# Patient Record
Sex: Female | Born: 1990 | Race: Black or African American | Hispanic: No | Marital: Single | Smoking: Never smoker
Health system: Southern US, Community
[De-identification: ages and names within clinical notes are randomized; demographics above are authoritative.]

## PROBLEM LIST (undated history)

## (undated) ENCOUNTER — Inpatient Hospital Stay (HOSPITAL_COMMUNITY): Payer: Self-pay

## (undated) DIAGNOSIS — D649 Anemia, unspecified: Secondary | ICD-10-CM

## (undated) DIAGNOSIS — O139 Gestational [pregnancy-induced] hypertension without significant proteinuria, unspecified trimester: Secondary | ICD-10-CM

## (undated) DIAGNOSIS — Z91018 Allergy to other foods: Secondary | ICD-10-CM

## (undated) DIAGNOSIS — M549 Dorsalgia, unspecified: Secondary | ICD-10-CM

## (undated) DIAGNOSIS — N83209 Unspecified ovarian cyst, unspecified side: Secondary | ICD-10-CM

## (undated) DIAGNOSIS — I10 Essential (primary) hypertension: Secondary | ICD-10-CM

## (undated) DIAGNOSIS — J189 Pneumonia, unspecified organism: Secondary | ICD-10-CM

## (undated) DIAGNOSIS — E669 Obesity, unspecified: Secondary | ICD-10-CM

## (undated) DIAGNOSIS — Z3490 Encounter for supervision of normal pregnancy, unspecified, unspecified trimester: Secondary | ICD-10-CM

## (undated) HISTORY — DX: Dorsalgia, unspecified: M54.9

## (undated) HISTORY — PX: TONSILLECTOMY: SUR1361

## (undated) HISTORY — PX: WISDOM TOOTH EXTRACTION: SHX21

## (undated) HISTORY — PX: KNEE ARTHROSCOPY: SUR90

## (undated) HISTORY — PX: DILATION AND CURETTAGE OF UTERUS: SHX78

## (undated) HISTORY — PX: TUBAL LIGATION: SHX77

## (undated) HISTORY — DX: Allergy to other foods: Z91.018

---
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
## 2008-01-08 ENCOUNTER — Emergency Department (HOSPITAL_COMMUNITY): Admission: EM | Admit: 2008-01-08 | Discharge: 2008-01-08 | Payer: Self-pay | Admitting: Family Medicine

## 2008-01-10 ENCOUNTER — Inpatient Hospital Stay (HOSPITAL_COMMUNITY): Admission: EM | Admit: 2008-01-10 | Discharge: 2008-01-12 | Payer: Self-pay | Admitting: Emergency Medicine

## 2008-01-18 ENCOUNTER — Inpatient Hospital Stay (HOSPITAL_COMMUNITY): Admission: AD | Admit: 2008-01-18 | Discharge: 2008-01-18 | Payer: Self-pay | Admitting: Obstetrics & Gynecology

## 2008-02-24 ENCOUNTER — Emergency Department (HOSPITAL_COMMUNITY): Admission: EM | Admit: 2008-02-24 | Discharge: 2008-02-24 | Payer: Self-pay | Admitting: Emergency Medicine

## 2008-04-08 ENCOUNTER — Emergency Department (HOSPITAL_COMMUNITY): Admission: EM | Admit: 2008-04-08 | Discharge: 2008-04-08 | Payer: Self-pay | Admitting: Emergency Medicine

## 2008-04-14 ENCOUNTER — Emergency Department (HOSPITAL_COMMUNITY): Admission: EM | Admit: 2008-04-14 | Discharge: 2008-04-14 | Payer: Self-pay | Admitting: Emergency Medicine

## 2008-07-01 ENCOUNTER — Emergency Department (HOSPITAL_COMMUNITY): Admission: EM | Admit: 2008-07-01 | Discharge: 2008-07-01 | Payer: Self-pay | Admitting: Family Medicine

## 2008-10-23 ENCOUNTER — Emergency Department (HOSPITAL_COMMUNITY): Admission: EM | Admit: 2008-10-23 | Discharge: 2008-10-23 | Payer: Self-pay | Admitting: Emergency Medicine

## 2008-11-02 ENCOUNTER — Encounter: Admission: RE | Admit: 2008-11-02 | Discharge: 2008-11-02 | Payer: Self-pay | Admitting: Family Medicine

## 2008-11-16 ENCOUNTER — Encounter: Admission: RE | Admit: 2008-11-16 | Discharge: 2008-12-22 | Payer: Self-pay | Admitting: Specialist

## 2008-12-13 ENCOUNTER — Ambulatory Visit: Payer: Self-pay | Admitting: Oncology

## 2009-01-04 LAB — CBC WITH DIFFERENTIAL (CANCER CENTER ONLY)
BASO#: 0.1 10*3/uL (ref 0.0–0.2)
BASO%: 0.6 % (ref 0.0–2.0)
EOS%: 4.7 % (ref 0.0–7.0)
HCT: 32.9 % — ABNORMAL LOW (ref 34.8–46.6)
LYMPH%: 25.6 % (ref 14.0–48.0)
MCHC: 33 g/dL (ref 32.0–36.0)
MCV: 67 fL — ABNORMAL LOW (ref 81–101)
MONO#: 0.4 10*3/uL (ref 0.1–0.9)
NEUT%: 64.4 % (ref 39.6–80.0)
RDW: 14.1 % (ref 10.5–14.6)

## 2009-01-04 LAB — CMP (CANCER CENTER ONLY)
Alkaline Phosphatase: 82 U/L (ref 26–84)
Glucose, Bld: 93 mg/dL (ref 73–118)
Sodium: 141 mEq/L (ref 128–145)
Total Bilirubin: 0.7 mg/dl (ref 0.20–1.60)
Total Protein: 7.6 g/dL (ref 6.4–8.1)

## 2009-01-04 LAB — MORPHOLOGY - CHCC SATELLITE: PLT EST ~~LOC~~: ADEQUATE

## 2009-01-06 LAB — PROTEIN ELECTROPHORESIS, SERUM
Beta 2: 7.3 % — ABNORMAL HIGH (ref 3.2–6.5)
Beta Globulin: 6.6 % (ref 4.7–7.2)
Total Protein, Serum Electrophoresis: 7.5 g/dL (ref 6.0–8.3)

## 2009-01-06 LAB — RETICULOCYTES (CHCC): Retic Ct Pct: 1.7 % (ref 0.4–3.1)

## 2009-01-06 LAB — HEMOGLOBINOPATHY EVALUATION: Hgb A2 Quant: 2.5 % (ref 2.2–3.2)

## 2009-01-06 LAB — IRON AND TIBC
%SAT: 19 % — ABNORMAL LOW (ref 20–55)
Iron: 67 ug/dL (ref 42–145)

## 2009-01-09 ENCOUNTER — Emergency Department (HOSPITAL_COMMUNITY): Admission: EM | Admit: 2009-01-09 | Discharge: 2009-01-09 | Payer: Self-pay | Admitting: Family Medicine

## 2009-01-12 ENCOUNTER — Emergency Department (HOSPITAL_COMMUNITY): Admission: EM | Admit: 2009-01-12 | Discharge: 2009-01-12 | Payer: Self-pay | Admitting: Family Medicine

## 2009-01-18 ENCOUNTER — Ambulatory Visit: Payer: Self-pay | Admitting: Oncology

## 2009-01-19 ENCOUNTER — Encounter: Admission: RE | Admit: 2009-01-19 | Discharge: 2009-03-15 | Payer: Self-pay | Admitting: Specialist

## 2009-02-06 LAB — CBC WITH DIFFERENTIAL (CANCER CENTER ONLY)
Eosinophils Absolute: 0.2 10*3/uL (ref 0.0–0.5)
HCT: 33.2 % — ABNORMAL LOW (ref 34.8–46.6)
HGB: 10.8 g/dL — ABNORMAL LOW (ref 11.6–15.9)
LYMPH%: 34.1 % (ref 14.0–48.0)
MCV: 68 fL — ABNORMAL LOW (ref 81–101)
MONO#: 0.5 10*3/uL (ref 0.1–0.9)
NEUT%: 56.4 % (ref 39.6–80.0)
RBC: 4.87 10*6/uL (ref 3.70–5.32)
WBC: 8.2 10*3/uL (ref 3.9–10.0)

## 2009-02-25 ENCOUNTER — Emergency Department (HOSPITAL_COMMUNITY): Admission: EM | Admit: 2009-02-25 | Discharge: 2009-02-26 | Payer: Self-pay | Admitting: Emergency Medicine

## 2009-03-07 ENCOUNTER — Emergency Department (HOSPITAL_COMMUNITY): Admission: EM | Admit: 2009-03-07 | Discharge: 2009-03-07 | Payer: Self-pay | Admitting: Emergency Medicine

## 2009-03-08 ENCOUNTER — Inpatient Hospital Stay (HOSPITAL_COMMUNITY): Admission: AD | Admit: 2009-03-08 | Discharge: 2009-03-08 | Payer: Self-pay | Admitting: Obstetrics & Gynecology

## 2009-03-11 ENCOUNTER — Ambulatory Visit (HOSPITAL_COMMUNITY): Admission: AD | Admit: 2009-03-11 | Discharge: 2009-03-11 | Payer: Self-pay | Admitting: Obstetrics & Gynecology

## 2009-03-14 ENCOUNTER — Emergency Department (HOSPITAL_COMMUNITY): Admission: EM | Admit: 2009-03-14 | Discharge: 2009-03-15 | Payer: Self-pay | Admitting: Emergency Medicine

## 2009-03-14 ENCOUNTER — Ambulatory Visit (HOSPITAL_COMMUNITY): Admission: RE | Admit: 2009-03-14 | Discharge: 2009-03-14 | Payer: Self-pay | Admitting: Obstetrics and Gynecology

## 2009-03-15 ENCOUNTER — Inpatient Hospital Stay (HOSPITAL_COMMUNITY): Admission: AD | Admit: 2009-03-15 | Discharge: 2009-03-15 | Payer: Self-pay | Admitting: Family Medicine

## 2009-03-18 ENCOUNTER — Inpatient Hospital Stay (HOSPITAL_COMMUNITY): Admission: AD | Admit: 2009-03-18 | Discharge: 2009-03-18 | Payer: Self-pay | Admitting: Obstetrics & Gynecology

## 2009-03-21 ENCOUNTER — Ambulatory Visit (HOSPITAL_COMMUNITY): Admission: RE | Admit: 2009-03-21 | Discharge: 2009-03-21 | Payer: Self-pay | Admitting: Family Medicine

## 2009-03-26 ENCOUNTER — Ambulatory Visit: Payer: Self-pay | Admitting: Obstetrics and Gynecology

## 2009-03-26 ENCOUNTER — Inpatient Hospital Stay (HOSPITAL_COMMUNITY): Admission: AD | Admit: 2009-03-26 | Discharge: 2009-03-26 | Payer: Self-pay | Admitting: Obstetrics and Gynecology

## 2009-03-28 ENCOUNTER — Inpatient Hospital Stay (HOSPITAL_COMMUNITY): Admission: AD | Admit: 2009-03-28 | Discharge: 2009-03-28 | Payer: Self-pay | Admitting: Obstetrics & Gynecology

## 2009-04-04 ENCOUNTER — Inpatient Hospital Stay (HOSPITAL_COMMUNITY): Admission: AD | Admit: 2009-04-04 | Discharge: 2009-04-04 | Payer: Self-pay | Admitting: Obstetrics and Gynecology

## 2009-05-03 ENCOUNTER — Ambulatory Visit: Payer: Self-pay | Admitting: Obstetrics and Gynecology

## 2009-05-03 ENCOUNTER — Ambulatory Visit: Payer: Self-pay | Admitting: Oncology

## 2009-06-12 ENCOUNTER — Emergency Department (HOSPITAL_COMMUNITY): Admission: EM | Admit: 2009-06-12 | Discharge: 2009-06-12 | Payer: Self-pay | Admitting: Emergency Medicine

## 2009-06-17 ENCOUNTER — Emergency Department (HOSPITAL_COMMUNITY): Admission: EM | Admit: 2009-06-17 | Discharge: 2009-06-17 | Payer: Self-pay | Admitting: Emergency Medicine

## 2009-07-10 ENCOUNTER — Emergency Department (HOSPITAL_COMMUNITY): Admission: EM | Admit: 2009-07-10 | Discharge: 2009-07-10 | Payer: Self-pay | Admitting: Emergency Medicine

## 2009-07-14 ENCOUNTER — Emergency Department (HOSPITAL_COMMUNITY): Admission: EM | Admit: 2009-07-14 | Discharge: 2009-07-14 | Payer: Self-pay | Admitting: Emergency Medicine

## 2009-07-20 ENCOUNTER — Emergency Department (HOSPITAL_COMMUNITY): Admission: EM | Admit: 2009-07-20 | Discharge: 2009-07-20 | Payer: Self-pay | Admitting: Emergency Medicine

## 2009-07-29 ENCOUNTER — Emergency Department (HOSPITAL_COMMUNITY): Admission: EM | Admit: 2009-07-29 | Discharge: 2009-07-29 | Payer: Self-pay | Admitting: Emergency Medicine

## 2009-08-16 ENCOUNTER — Emergency Department (HOSPITAL_COMMUNITY): Admission: EM | Admit: 2009-08-16 | Discharge: 2009-08-16 | Payer: Self-pay | Admitting: Emergency Medicine

## 2009-09-11 ENCOUNTER — Emergency Department (HOSPITAL_COMMUNITY): Admission: EM | Admit: 2009-09-11 | Discharge: 2009-09-11 | Payer: Self-pay | Admitting: Emergency Medicine

## 2009-09-25 ENCOUNTER — Emergency Department (HOSPITAL_COMMUNITY): Admission: EM | Admit: 2009-09-25 | Discharge: 2009-09-25 | Payer: Self-pay | Admitting: Emergency Medicine

## 2009-09-26 ENCOUNTER — Emergency Department (HOSPITAL_COMMUNITY): Admission: EM | Admit: 2009-09-26 | Discharge: 2009-09-26 | Payer: Self-pay | Admitting: Emergency Medicine

## 2009-10-23 ENCOUNTER — Emergency Department (HOSPITAL_COMMUNITY): Admission: EM | Admit: 2009-10-23 | Discharge: 2009-10-23 | Payer: Self-pay | Admitting: Emergency Medicine

## 2009-11-30 ENCOUNTER — Emergency Department (HOSPITAL_COMMUNITY): Admission: EM | Admit: 2009-11-30 | Discharge: 2009-12-01 | Payer: Self-pay | Admitting: Emergency Medicine

## 2010-02-17 ENCOUNTER — Inpatient Hospital Stay (HOSPITAL_COMMUNITY): Admission: AD | Admit: 2010-02-17 | Discharge: 2010-02-17 | Payer: Self-pay | Admitting: Obstetrics and Gynecology

## 2010-02-19 ENCOUNTER — Ambulatory Visit: Payer: Self-pay | Admitting: Family

## 2010-02-19 ENCOUNTER — Inpatient Hospital Stay (HOSPITAL_COMMUNITY): Admission: AD | Admit: 2010-02-19 | Discharge: 2010-02-20 | Payer: Self-pay | Admitting: Obstetrics and Gynecology

## 2010-02-22 ENCOUNTER — Inpatient Hospital Stay (HOSPITAL_COMMUNITY): Admission: AD | Admit: 2010-02-22 | Discharge: 2010-02-22 | Payer: Self-pay | Admitting: Obstetrics & Gynecology

## 2010-02-22 ENCOUNTER — Ambulatory Visit: Payer: Self-pay | Admitting: Nurse Practitioner

## 2010-03-07 ENCOUNTER — Inpatient Hospital Stay (HOSPITAL_COMMUNITY): Admission: AD | Admit: 2010-03-07 | Discharge: 2010-03-07 | Payer: Self-pay | Admitting: Obstetrics and Gynecology

## 2010-03-07 ENCOUNTER — Ambulatory Visit: Payer: Self-pay | Admitting: Obstetrics and Gynecology

## 2010-03-14 ENCOUNTER — Inpatient Hospital Stay (HOSPITAL_COMMUNITY): Admission: AD | Admit: 2010-03-14 | Discharge: 2010-03-14 | Payer: Self-pay | Admitting: Obstetrics and Gynecology

## 2010-03-26 ENCOUNTER — Emergency Department (HOSPITAL_BASED_OUTPATIENT_CLINIC_OR_DEPARTMENT_OTHER): Admission: EM | Admit: 2010-03-26 | Discharge: 2010-03-26 | Payer: Self-pay | Admitting: Emergency Medicine

## 2010-04-08 ENCOUNTER — Ambulatory Visit: Payer: Self-pay | Admitting: Obstetrics and Gynecology

## 2010-04-08 ENCOUNTER — Inpatient Hospital Stay (HOSPITAL_COMMUNITY): Admission: AD | Admit: 2010-04-08 | Discharge: 2010-04-08 | Payer: Self-pay | Admitting: Obstetrics and Gynecology

## 2010-04-11 ENCOUNTER — Ambulatory Visit: Payer: Self-pay | Admitting: Family Medicine

## 2010-04-11 ENCOUNTER — Ambulatory Visit (HOSPITAL_COMMUNITY): Admission: RE | Admit: 2010-04-11 | Discharge: 2010-04-11 | Payer: Self-pay | Admitting: Family Medicine

## 2010-04-27 ENCOUNTER — Ambulatory Visit: Payer: Self-pay | Admitting: Obstetrics & Gynecology

## 2010-05-13 ENCOUNTER — Ambulatory Visit: Payer: Self-pay | Admitting: Diagnostic Radiology

## 2010-05-13 ENCOUNTER — Emergency Department (HOSPITAL_BASED_OUTPATIENT_CLINIC_OR_DEPARTMENT_OTHER): Admission: EM | Admit: 2010-05-13 | Discharge: 2010-05-13 | Payer: Self-pay | Admitting: Emergency Medicine

## 2010-05-24 ENCOUNTER — Emergency Department (HOSPITAL_COMMUNITY): Admission: EM | Admit: 2010-05-24 | Discharge: 2009-12-08 | Payer: Self-pay | Admitting: Emergency Medicine

## 2010-06-11 IMAGING — CR DG KNEE COMPLETE 4+V*L*
4 series · 4 of 4 positions shown · non-contrast
Comparison: None

CLINICAL DATA: Fell - knee pain

LEFT KNEE - COMPLETE 4+ VIEW

[t knee ap left]
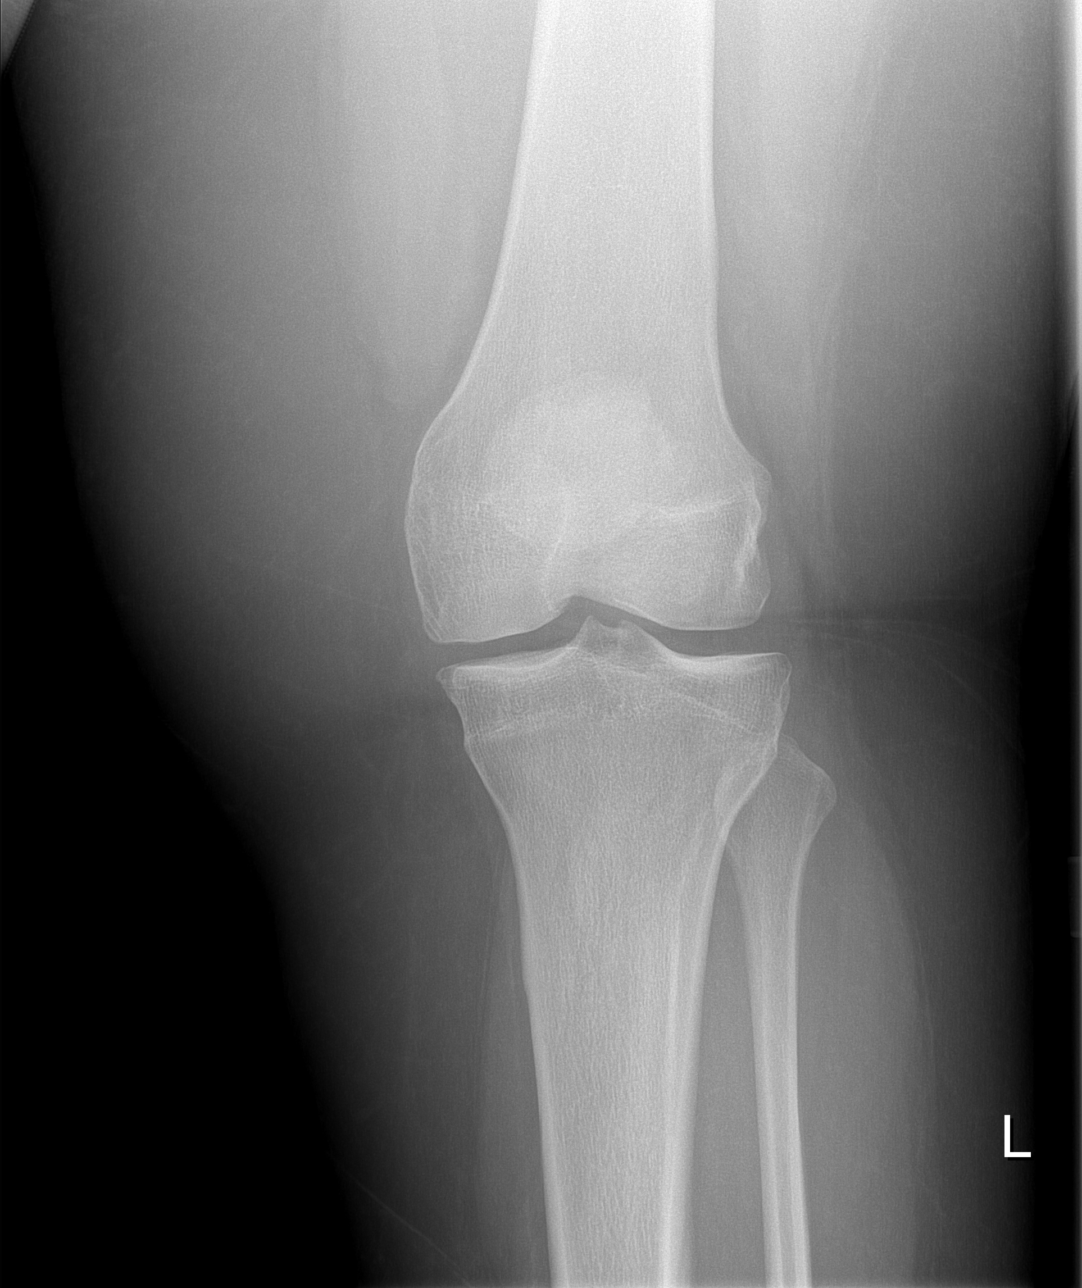

[t knee oblique left (1 of 2)]
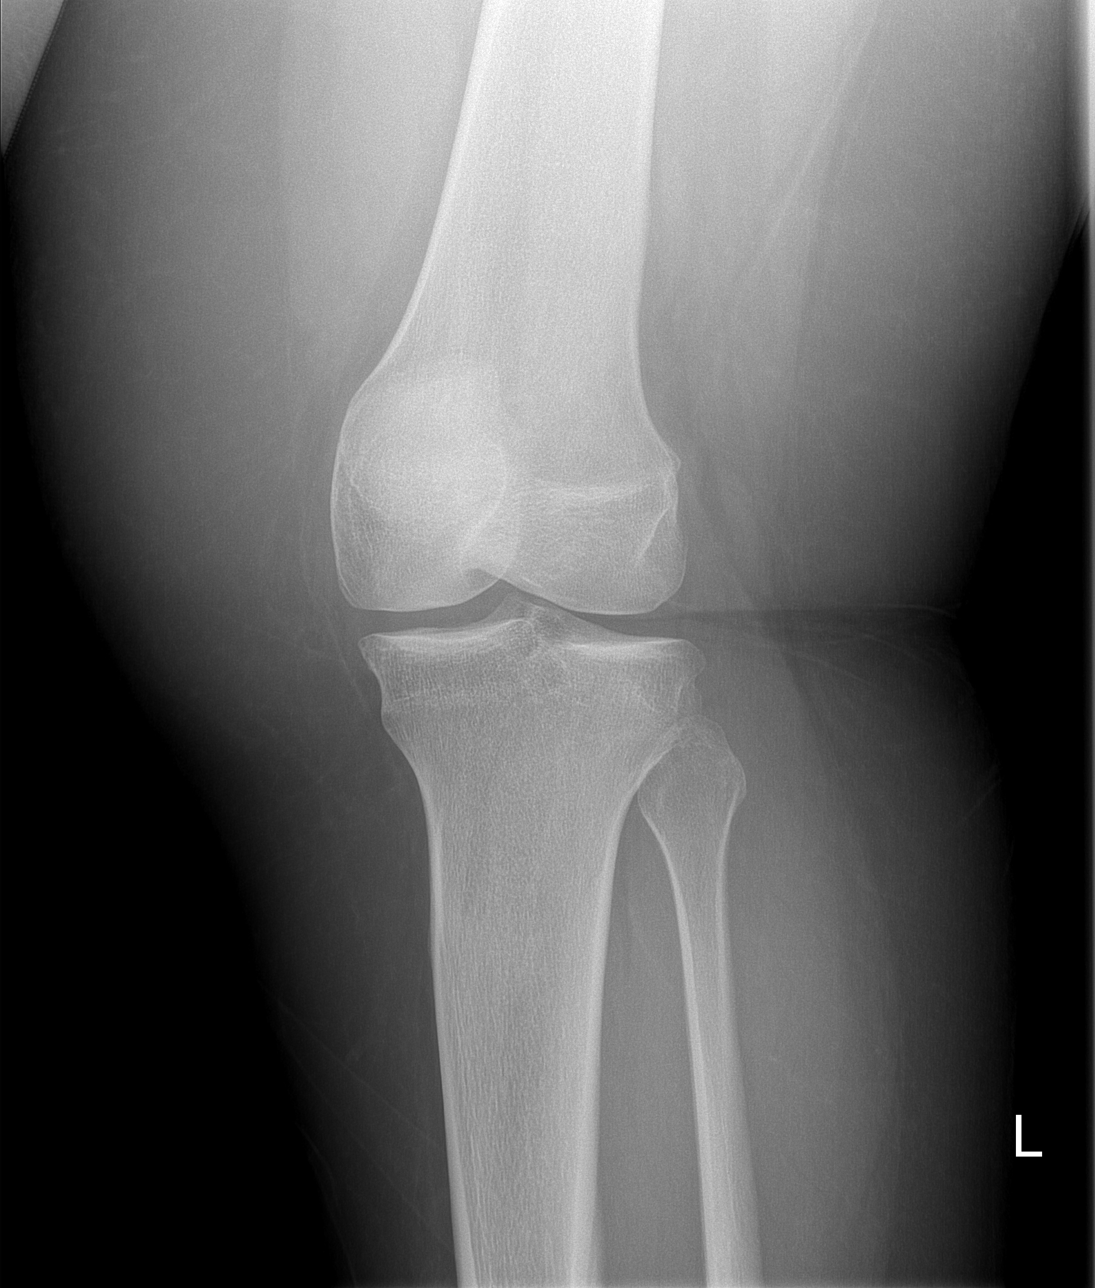

[t knee oblique left (2 of 2)]
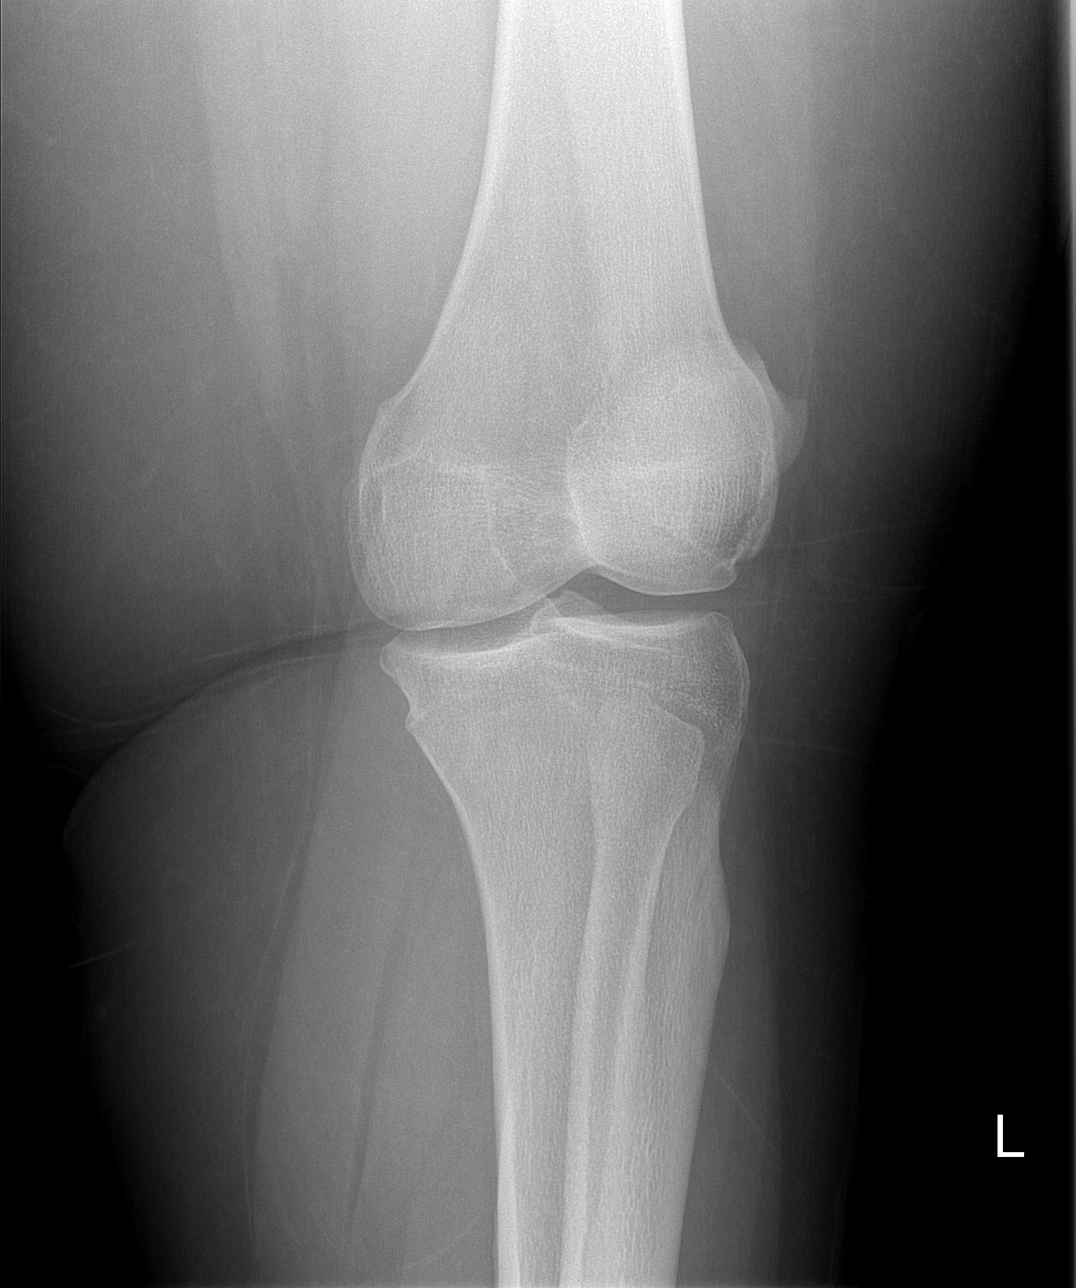

[t knee lat left]
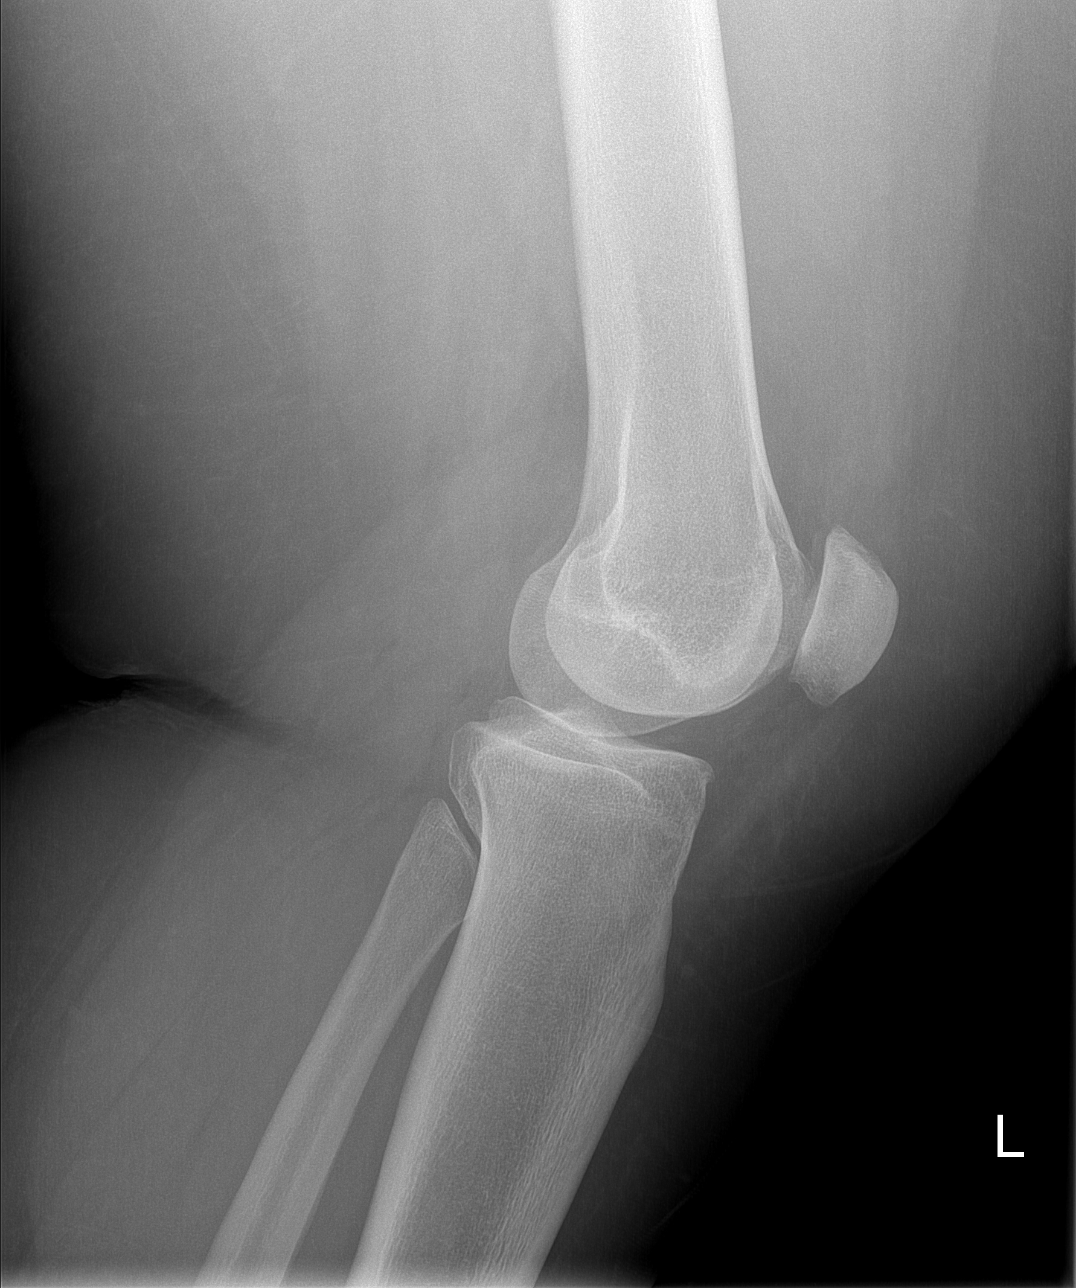

[4 of 4 positions shown; findings below may reference images not displayed]

FINDINGS: No fracture, dislocation, or joint effusion.  There are
mild degenerative changes.
IMPRESSION: No acute findings.

## 2010-07-04 ENCOUNTER — Emergency Department (HOSPITAL_BASED_OUTPATIENT_CLINIC_OR_DEPARTMENT_OTHER)
Admission: EM | Admit: 2010-07-04 | Discharge: 2010-07-04 | Payer: Self-pay | Source: Home / Self Care | Admitting: Emergency Medicine

## 2010-07-11 ENCOUNTER — Emergency Department (HOSPITAL_BASED_OUTPATIENT_CLINIC_OR_DEPARTMENT_OTHER)
Admission: EM | Admit: 2010-07-11 | Discharge: 2010-07-11 | Payer: Self-pay | Source: Home / Self Care | Admitting: Emergency Medicine

## 2010-07-11 LAB — URINALYSIS, ROUTINE W REFLEX MICROSCOPIC
Nitrite: POSITIVE — AB
Specific Gravity, Urine: 1.025 (ref 1.005–1.030)
Urine Glucose, Fasting: NEGATIVE mg/dL
pH: 6 (ref 5.0–8.0)

## 2010-07-11 LAB — URINE MICROSCOPIC-ADD ON

## 2010-07-11 LAB — PREGNANCY, URINE: Preg Test, Ur: NEGATIVE

## 2010-08-10 ENCOUNTER — Emergency Department (HOSPITAL_BASED_OUTPATIENT_CLINIC_OR_DEPARTMENT_OTHER)
Admission: EM | Admit: 2010-08-10 | Discharge: 2010-08-10 | Disposition: A | Discharge status: Home / Self Care | Payer: Self-pay | Attending: Emergency Medicine | Admitting: Emergency Medicine

## 2010-08-10 DIAGNOSIS — M545 Low back pain, unspecified: Secondary | ICD-10-CM | POA: Insufficient documentation

## 2010-08-10 DIAGNOSIS — I10 Essential (primary) hypertension: Secondary | ICD-10-CM | POA: Insufficient documentation

## 2010-08-10 LAB — PREGNANCY, URINE: Preg Test, Ur: NEGATIVE

## 2010-08-10 LAB — URINALYSIS, ROUTINE W REFLEX MICROSCOPIC
Bilirubin Urine: NEGATIVE
Hgb urine dipstick: NEGATIVE
Ketones, ur: NEGATIVE mg/dL
Protein, ur: NEGATIVE mg/dL

## 2010-08-28 LAB — URINALYSIS, ROUTINE W REFLEX MICROSCOPIC
Bilirubin Urine: NEGATIVE
Nitrite: NEGATIVE
Specific Gravity, Urine: 1.022 (ref 1.005–1.030)
Urobilinogen, UA: 0.2 mg/dL (ref 0.0–1.0)

## 2010-08-28 LAB — WET PREP, GENITAL: Trich, Wet Prep: NONE SEEN

## 2010-08-28 LAB — PREGNANCY, URINE: Preg Test, Ur: NEGATIVE

## 2010-08-28 LAB — GC/CHLAMYDIA PROBE AMP, GENITAL: Chlamydia, DNA Probe: NEGATIVE

## 2010-08-28 LAB — RPR: RPR Ser Ql: NONREACTIVE

## 2010-08-29 LAB — URINALYSIS, ROUTINE W REFLEX MICROSCOPIC
Bilirubin Urine: NEGATIVE
Glucose, UA: NEGATIVE mg/dL
Ketones, ur: NEGATIVE mg/dL
pH: 6 (ref 5.0–8.0)

## 2010-08-29 LAB — WET PREP, GENITAL: Yeast Wet Prep HPF POC: NONE SEEN

## 2010-08-29 LAB — CBC
HCT: 36 % (ref 36.0–46.0)
MCHC: 32.3 g/dL (ref 30.0–36.0)
MCV: 71.5 fL — ABNORMAL LOW (ref 78.0–100.0)
RDW: 15.9 % — ABNORMAL HIGH (ref 11.5–15.5)

## 2010-08-29 LAB — GC/CHLAMYDIA PROBE AMP, GENITAL: Chlamydia, DNA Probe: NEGATIVE

## 2010-08-30 LAB — URINALYSIS, ROUTINE W REFLEX MICROSCOPIC
Bilirubin Urine: NEGATIVE
Bilirubin Urine: NEGATIVE
Glucose, UA: NEGATIVE mg/dL
Glucose, UA: NEGATIVE mg/dL
Ketones, ur: 15 mg/dL — AB
Nitrite: NEGATIVE
Specific Gravity, Urine: 1.025 (ref 1.005–1.030)
Specific Gravity, Urine: >1.03 — ABNORMAL HIGH (ref 1.005–1.030)
Urobilinogen, UA: 0.2 mg/dL (ref 0.0–1.0)
pH: 6 (ref 5.0–8.0)
pH: 6 (ref 5.0–8.0)
pH: 6 (ref 5.0–8.0)

## 2010-08-30 LAB — WET PREP, GENITAL: Yeast Wet Prep HPF POC: NONE SEEN

## 2010-08-30 LAB — URINE CULTURE: Culture  Setup Time: 201109040012

## 2010-08-30 LAB — URINE MICROSCOPIC-ADD ON

## 2010-08-30 LAB — COMPREHENSIVE METABOLIC PANEL
ALT: 14 U/L (ref 0–35)
AST: 16 U/L (ref 0–37)
Albumin: 3.5 g/dL (ref 3.5–5.2)
CO2: 25 mEq/L (ref 19–32)
Calcium: 9.3 mg/dL (ref 8.4–10.5)
GFR calc Af Amer: >60 mL/min (ref >60)
Sodium: 137 mEq/L (ref 135–145)
Total Protein: 7.7 g/dL (ref 6.0–8.3)

## 2010-08-30 LAB — CBC
Hemoglobin: 10.8 g/dL — ABNORMAL LOW (ref 12.0–15.0)
Hemoglobin: 10.8 g/dL — ABNORMAL LOW (ref 12.0–15.0)
MCH: 23.1 pg — ABNORMAL LOW (ref 26.0–34.0)
MCHC: 31.3 g/dL (ref 30.0–36.0)
MCV: 70.8 fL — ABNORMAL LOW (ref 78.0–100.0)
Platelets: 350 10*3/uL (ref 150–400)
RBC: 4.67 MIL/uL (ref 3.87–5.11)
RDW: 15.8 % — ABNORMAL HIGH (ref 11.5–15.5)
WBC: 10.5 10*3/uL (ref 4.0–10.5)

## 2010-08-30 LAB — POCT PREGNANCY, URINE: Preg Test, Ur: POSITIVE

## 2010-08-30 LAB — HCG, QUANTITATIVE, PREGNANCY: hCG, Beta Chain, Quant, S: 3770 m[IU]/mL — ABNORMAL HIGH (ref <5)

## 2010-09-02 LAB — DIFFERENTIAL
Basophils Relative: 0 % (ref 0–1)
Eosinophils Absolute: 0.2 10*3/uL (ref 0.0–0.7)
Lymphs Abs: 2.4 10*3/uL (ref 0.7–4.0)
Monocytes Absolute: 0.3 10*3/uL (ref 0.1–1.0)
Monocytes Relative: 2 % — ABNORMAL LOW (ref 3–12)
Neutrophils Relative %: 74 % (ref 43–77)

## 2010-09-02 LAB — URINE CULTURE: Colony Count: >=100000

## 2010-09-02 LAB — URINALYSIS, ROUTINE W REFLEX MICROSCOPIC
Bilirubin Urine: NEGATIVE
Bilirubin Urine: NEGATIVE
Glucose, UA: NEGATIVE mg/dL
Hgb urine dipstick: NEGATIVE
Nitrite: NEGATIVE
Nitrite: NEGATIVE
Protein, ur: NEGATIVE mg/dL
Specific Gravity, Urine: 1.034 — ABNORMAL HIGH (ref 1.005–1.030)
Specific Gravity, Urine: 1.022 (ref 1.005–1.030)
Urobilinogen, UA: 0.2 mg/dL (ref 0.0–1.0)
pH: 7 (ref 5.0–8.0)

## 2010-09-02 LAB — URINE MICROSCOPIC-ADD ON

## 2010-09-02 LAB — CBC
Hemoglobin: 11 g/dL — ABNORMAL LOW (ref 12.0–15.0)
MCHC: 32.6 g/dL (ref 30.0–36.0)
Platelets: 290 10*3/uL (ref 150–400)
RDW: 15.2 % (ref 11.5–15.5)

## 2010-09-02 LAB — COMPREHENSIVE METABOLIC PANEL
ALT: 16 U/L (ref 0–35)
AST: 20 U/L (ref 0–37)
Albumin: 3.5 g/dL (ref 3.5–5.2)
Alkaline Phosphatase: 73 U/L (ref 39–117)
Calcium: 9.1 mg/dL (ref 8.4–10.5)
GFR calc Af Amer: >60 mL/min (ref >60)
Glucose, Bld: 104 mg/dL — ABNORMAL HIGH (ref 70–99)
Potassium: 3.4 mEq/L — ABNORMAL LOW (ref 3.5–5.1)
Sodium: 136 mEq/L (ref 135–145)
Total Protein: 7.3 g/dL (ref 6.0–8.3)

## 2010-09-02 LAB — PREGNANCY, URINE: Preg Test, Ur: NEGATIVE

## 2010-09-02 LAB — POCT PREGNANCY, URINE: Preg Test, Ur: NEGATIVE

## 2010-09-03 ENCOUNTER — Emergency Department (HOSPITAL_BASED_OUTPATIENT_CLINIC_OR_DEPARTMENT_OTHER)
Admission: EM | Admit: 2010-09-03 | Discharge: 2010-09-04 | Disposition: A | Discharge status: Home / Self Care | Payer: Self-pay | Attending: Emergency Medicine | Admitting: Emergency Medicine

## 2010-09-03 ENCOUNTER — Emergency Department (INDEPENDENT_AMBULATORY_CARE_PROVIDER_SITE_OTHER): Payer: Self-pay

## 2010-09-03 DIAGNOSIS — X58XXXA Exposure to other specified factors, initial encounter: Secondary | ICD-10-CM | POA: Insufficient documentation

## 2010-09-03 DIAGNOSIS — I10 Essential (primary) hypertension: Secondary | ICD-10-CM | POA: Insufficient documentation

## 2010-09-03 DIAGNOSIS — E669 Obesity, unspecified: Secondary | ICD-10-CM | POA: Insufficient documentation

## 2010-09-03 DIAGNOSIS — M25569 Pain in unspecified knee: Secondary | ICD-10-CM | POA: Insufficient documentation

## 2010-09-04 LAB — COMPREHENSIVE METABOLIC PANEL
ALT: 16 U/L (ref 0–35)
Alkaline Phosphatase: 81 U/L (ref 39–117)
BUN: 8 mg/dL (ref 6–23)
CO2: 26 mEq/L (ref 19–32)
GFR calc non Af Amer: >60 mL/min (ref >60)
Glucose, Bld: 93 mg/dL (ref 70–99)
Potassium: 3.4 mEq/L — ABNORMAL LOW (ref 3.5–5.1)
Sodium: 139 mEq/L (ref 135–145)
Total Bilirubin: 0.3 mg/dL (ref 0.3–1.2)

## 2010-09-04 LAB — CBC
HCT: 35.7 % — ABNORMAL LOW (ref 36.0–46.0)
Hemoglobin: 11.2 g/dL — ABNORMAL LOW (ref 12.0–15.0)
RBC: 5.04 MIL/uL (ref 3.87–5.11)

## 2010-09-04 LAB — DIFFERENTIAL
Basophils Absolute: 0.1 10*3/uL (ref 0.0–0.1)
Basophils Relative: 1 % (ref 0–1)
Eosinophils Absolute: 0.2 10*3/uL (ref 0.0–0.7)
Neutrophils Relative %: 62 % (ref 43–77)

## 2010-09-05 LAB — GLUCOSE, CAPILLARY
Glucose-Capillary: 59 mg/dL — ABNORMAL LOW (ref 70–99)
Glucose-Capillary: 65 mg/dL — ABNORMAL LOW (ref 70–99)

## 2010-09-20 LAB — CBC
HCT: 32.2 % — ABNORMAL LOW (ref 36.0–46.0)
Hemoglobin: 10.3 g/dL — ABNORMAL LOW (ref 12.0–15.0)
MCV: 73.2 fL — ABNORMAL LOW (ref 78.0–100.0)
RBC: 4.4 MIL/uL (ref 3.87–5.11)
WBC: 9.6 10*3/uL (ref 4.0–10.5)

## 2010-09-20 LAB — HCG, QUANTITATIVE, PREGNANCY
hCG, Beta Chain, Quant, S: 1567 m[IU]/mL — ABNORMAL HIGH (ref <5)
hCG, Beta Chain, Quant, S: 716 m[IU]/mL — ABNORMAL HIGH (ref <5)

## 2010-09-21 LAB — GC/CHLAMYDIA PROBE AMP, GENITAL
Chlamydia, DNA Probe: NEGATIVE
GC Probe Amp, Genital: NEGATIVE

## 2010-09-21 LAB — DIFFERENTIAL
Basophils Relative: 0 % (ref 0–1)
Eosinophils Absolute: 0.1 10*3/uL (ref 0.0–0.7)
Eosinophils Absolute: 0.2 10*3/uL (ref 0.0–0.7)
Eosinophils Relative: 3 % (ref 0–5)
Lymphs Abs: 2.1 10*3/uL (ref 0.7–4.0)
Monocytes Absolute: 0.4 10*3/uL (ref 0.1–1.0)
Monocytes Absolute: 0.6 10*3/uL (ref 0.1–1.0)
Monocytes Relative: 4 % (ref 3–12)
Monocytes Relative: 8 % (ref 3–12)
Neutro Abs: 4.8 10*3/uL (ref 1.7–7.7)
Neutrophils Relative %: 61 % (ref 43–77)
Neutrophils Relative %: 69 % (ref 43–77)
nRBC: 0 /100 WBC

## 2010-09-21 LAB — CBC
HCT: 28.9 % — ABNORMAL LOW (ref 36.0–46.0)
HCT: 29.2 % — ABNORMAL LOW (ref 36.0–46.0)
Hemoglobin: 9.3 g/dL — ABNORMAL LOW (ref 12.0–15.0)
MCHC: 32 g/dL (ref 30.0–36.0)
MCV: 72 fL — ABNORMAL LOW (ref 78.0–100.0)
MCV: 72.1 fL — ABNORMAL LOW (ref 78.0–100.0)
Platelets: 323 10*3/uL (ref 150–400)
Platelets: 353 10*3/uL (ref 150–400)
RDW: 15.4 % (ref 11.5–15.5)
RDW: 15.4 % (ref 11.5–15.5)
WBC: 7.8 10*3/uL (ref 4.0–10.5)
WBC: 8.9 10*3/uL (ref 4.0–10.5)
WBC: 9.6 10*3/uL (ref 4.0–10.5)

## 2010-09-21 LAB — WET PREP, GENITAL: Clue Cells Wet Prep HPF POC: NONE SEEN

## 2010-09-21 LAB — URINALYSIS, ROUTINE W REFLEX MICROSCOPIC
Bilirubin Urine: NEGATIVE
Glucose, UA: NEGATIVE mg/dL
Hgb urine dipstick: NEGATIVE
Specific Gravity, Urine: 1.024 (ref 1.005–1.030)
Urobilinogen, UA: 0.2 mg/dL (ref 0.0–1.0)

## 2010-09-21 LAB — BUN
BUN: 4 mg/dL — ABNORMAL LOW (ref 6–23)
BUN: 5 mg/dL — ABNORMAL LOW (ref 6–23)

## 2010-09-21 LAB — POCT PREGNANCY, URINE: Preg Test, Ur: POSITIVE

## 2010-09-21 LAB — HCG, QUANTITATIVE, PREGNANCY
hCG, Beta Chain, Quant, S: 1027 m[IU]/mL — ABNORMAL HIGH (ref <5)
hCG, Beta Chain, Quant, S: 2292 m[IU]/mL — ABNORMAL HIGH (ref <5)
hCG, Beta Chain, Quant, S: 2701 m[IU]/mL — ABNORMAL HIGH (ref <5)

## 2010-09-21 LAB — ABO/RH: ABO/RH(D): A POS

## 2010-09-21 LAB — URINE MICROSCOPIC-ADD ON

## 2010-09-21 LAB — CREATININE, SERUM
Creatinine, Ser: 0.64 mg/dL (ref 0.4–1.2)
GFR calc Af Amer: >60 mL/min (ref >60)
GFR calc Af Amer: >60 mL/min (ref >60)
GFR calc non Af Amer: >60 mL/min (ref >60)
GFR calc non Af Amer: >60 mL/min (ref >60)

## 2010-10-09 ENCOUNTER — Inpatient Hospital Stay (HOSPITAL_COMMUNITY)
Admission: AD | Admit: 2010-10-09 | Discharge: 2010-10-09 | Disposition: A | Discharge status: Home / Self Care | Payer: Medicaid Other | Source: Ambulatory Visit | Attending: Obstetrics & Gynecology | Admitting: Obstetrics & Gynecology

## 2010-10-09 ENCOUNTER — Inpatient Hospital Stay (HOSPITAL_COMMUNITY): Payer: Medicaid Other

## 2010-10-09 DIAGNOSIS — O99891 Other specified diseases and conditions complicating pregnancy: Secondary | ICD-10-CM | POA: Insufficient documentation

## 2010-10-09 DIAGNOSIS — R109 Unspecified abdominal pain: Secondary | ICD-10-CM | POA: Insufficient documentation

## 2010-10-09 LAB — CBC
Hemoglobin: 10.4 g/dL — ABNORMAL LOW (ref 12.0–15.0)
MCHC: 31.4 g/dL (ref 30.0–36.0)
Platelets: 346 10*3/uL (ref 150–400)
RDW: 15.3 % (ref 11.5–15.5)

## 2010-10-09 LAB — URINALYSIS, ROUTINE W REFLEX MICROSCOPIC
Bilirubin Urine: NEGATIVE
Glucose, UA: NEGATIVE mg/dL
Hgb urine dipstick: NEGATIVE
Specific Gravity, Urine: 1.025 (ref 1.005–1.030)
pH: 6 (ref 5.0–8.0)

## 2010-10-09 LAB — POCT PREGNANCY, URINE: Preg Test, Ur: POSITIVE

## 2010-10-09 LAB — WET PREP, GENITAL: Yeast Wet Prep HPF POC: NONE SEEN

## 2010-10-09 LAB — ABO/RH: ABO/RH(D): A POS

## 2010-10-10 LAB — GC/CHLAMYDIA PROBE AMP, GENITAL
Chlamydia, DNA Probe: NEGATIVE
GC Probe Amp, Genital: NEGATIVE

## 2010-10-12 ENCOUNTER — Inpatient Hospital Stay (HOSPITAL_COMMUNITY)
Admission: AD | Admit: 2010-10-12 | Discharge: 2010-10-12 | Disposition: A | Discharge status: Home / Self Care | Payer: Medicaid Other | Source: Ambulatory Visit | Attending: Family Medicine | Admitting: Family Medicine

## 2010-10-12 DIAGNOSIS — O99891 Other specified diseases and conditions complicating pregnancy: Secondary | ICD-10-CM | POA: Insufficient documentation

## 2010-10-12 DIAGNOSIS — O9989 Other specified diseases and conditions complicating pregnancy, childbirth and the puerperium: Secondary | ICD-10-CM

## 2010-10-12 DIAGNOSIS — O3680X0 Pregnancy with inconclusive fetal viability, not applicable or unspecified: Secondary | ICD-10-CM

## 2010-10-12 LAB — HCG, QUANTITATIVE, PREGNANCY: hCG, Beta Chain, Quant, S: 858 m[IU]/mL — ABNORMAL HIGH (ref <5)

## 2010-10-19 ENCOUNTER — Ambulatory Visit (HOSPITAL_COMMUNITY)
Admission: RE | Admit: 2010-10-19 | Discharge: 2010-10-19 | Disposition: A | Discharge status: Home / Self Care | Payer: Medicaid Other | Source: Ambulatory Visit | Attending: Family Medicine | Admitting: Family Medicine

## 2010-10-19 DIAGNOSIS — Z3689 Encounter for other specified antenatal screening: Secondary | ICD-10-CM | POA: Insufficient documentation

## 2010-10-22 ENCOUNTER — Ambulatory Visit (HOSPITAL_COMMUNITY): Payer: Self-pay

## 2010-10-27 ENCOUNTER — Emergency Department (HOSPITAL_BASED_OUTPATIENT_CLINIC_OR_DEPARTMENT_OTHER)
Admission: EM | Admit: 2010-10-27 | Discharge: 2010-10-27 | Disposition: A | Discharge status: Home / Self Care | Payer: Medicaid Other | Attending: Emergency Medicine | Admitting: Emergency Medicine

## 2010-10-27 DIAGNOSIS — O2390 Unspecified genitourinary tract infection in pregnancy, unspecified trimester: Secondary | ICD-10-CM | POA: Insufficient documentation

## 2010-10-27 DIAGNOSIS — R109 Unspecified abdominal pain: Secondary | ICD-10-CM | POA: Insufficient documentation

## 2010-10-27 DIAGNOSIS — N390 Urinary tract infection, site not specified: Secondary | ICD-10-CM | POA: Insufficient documentation

## 2010-10-27 LAB — URINALYSIS, ROUTINE W REFLEX MICROSCOPIC
Nitrite: POSITIVE — AB
Protein, ur: NEGATIVE mg/dL
Specific Gravity, Urine: 1.028 (ref 1.005–1.030)
Urobilinogen, UA: 1 mg/dL (ref 0.0–1.0)

## 2010-10-27 LAB — URINE MICROSCOPIC-ADD ON

## 2010-11-02 ENCOUNTER — Other Ambulatory Visit (HOSPITAL_COMMUNITY): Payer: Self-pay | Admitting: Obstetrics and Gynecology

## 2010-11-02 ENCOUNTER — Other Ambulatory Visit: Payer: Self-pay | Admitting: Obstetrics and Gynecology

## 2010-11-02 ENCOUNTER — Ambulatory Visit (HOSPITAL_COMMUNITY)
Admission: RE | Admit: 2010-11-02 | Discharge: 2010-11-02 | Disposition: A | Discharge status: Home / Self Care | Payer: Medicaid Other | Source: Ambulatory Visit | Attending: Obstetrics and Gynecology | Admitting: Obstetrics and Gynecology

## 2010-11-02 DIAGNOSIS — O3680X0 Pregnancy with inconclusive fetal viability, not applicable or unspecified: Secondary | ICD-10-CM

## 2010-11-02 DIAGNOSIS — Z3689 Encounter for other specified antenatal screening: Secondary | ICD-10-CM | POA: Insufficient documentation

## 2010-11-14 ENCOUNTER — Other Ambulatory Visit (HOSPITAL_COMMUNITY): Payer: Self-pay | Admitting: Obstetrics and Gynecology

## 2010-11-14 DIAGNOSIS — Z3682 Encounter for antenatal screening for nuchal translucency: Secondary | ICD-10-CM

## 2010-11-22 ENCOUNTER — Inpatient Hospital Stay (HOSPITAL_COMMUNITY)
Admission: AD | Admit: 2010-11-22 | Discharge: 2010-11-22 | Disposition: A | Discharge status: Home / Self Care | Payer: Medicaid Other | Source: Ambulatory Visit | Attending: Obstetrics and Gynecology | Admitting: Obstetrics and Gynecology

## 2010-11-22 DIAGNOSIS — M545 Low back pain, unspecified: Secondary | ICD-10-CM | POA: Insufficient documentation

## 2010-11-22 DIAGNOSIS — O99891 Other specified diseases and conditions complicating pregnancy: Secondary | ICD-10-CM | POA: Insufficient documentation

## 2010-12-12 ENCOUNTER — Ambulatory Visit (HOSPITAL_COMMUNITY)
Admission: RE | Admit: 2010-12-12 | Discharge: 2010-12-12 | Disposition: A | Discharge status: Home / Self Care | Payer: Medicaid Other | Source: Ambulatory Visit | Attending: Obstetrics and Gynecology | Admitting: Obstetrics and Gynecology

## 2010-12-12 ENCOUNTER — Other Ambulatory Visit (HOSPITAL_COMMUNITY): Payer: Self-pay | Admitting: Obstetrics and Gynecology

## 2010-12-12 DIAGNOSIS — O351XX0 Maternal care for (suspected) chromosomal abnormality in fetus, not applicable or unspecified: Secondary | ICD-10-CM | POA: Insufficient documentation

## 2010-12-12 DIAGNOSIS — Z3689 Encounter for other specified antenatal screening: Secondary | ICD-10-CM | POA: Insufficient documentation

## 2010-12-12 DIAGNOSIS — Z3682 Encounter for antenatal screening for nuchal translucency: Secondary | ICD-10-CM

## 2010-12-12 DIAGNOSIS — O3510X0 Maternal care for (suspected) chromosomal abnormality in fetus, unspecified, not applicable or unspecified: Secondary | ICD-10-CM | POA: Insufficient documentation

## 2010-12-12 DIAGNOSIS — Z139 Encounter for screening, unspecified: Secondary | ICD-10-CM

## 2010-12-12 LAB — ABO/RH: RH Type: POSITIVE

## 2010-12-12 LAB — ANTIBODY SCREEN: Antibody Screen: NEGATIVE

## 2010-12-12 LAB — RUBELLA ANTIBODY, IGM: Rubella: IMMUNE

## 2010-12-28 ENCOUNTER — Encounter (HOSPITAL_COMMUNITY): Payer: Self-pay

## 2011-01-05 ENCOUNTER — Encounter (HOSPITAL_COMMUNITY): Payer: Self-pay

## 2011-01-05 ENCOUNTER — Inpatient Hospital Stay (HOSPITAL_COMMUNITY)
Admission: AD | Admit: 2011-01-05 | Discharge: 2011-01-05 | Disposition: A | Discharge status: Home / Self Care | Payer: Medicaid Other | Source: Ambulatory Visit | Attending: Obstetrics & Gynecology | Admitting: Obstetrics & Gynecology

## 2011-01-05 ENCOUNTER — Inpatient Hospital Stay (HOSPITAL_COMMUNITY): Payer: Medicaid Other

## 2011-01-05 DIAGNOSIS — O368390 Maternal care for abnormalities of the fetal heart rate or rhythm, unspecified trimester, not applicable or unspecified: Secondary | ICD-10-CM | POA: Insufficient documentation

## 2011-01-05 DIAGNOSIS — O26899 Other specified pregnancy related conditions, unspecified trimester: Secondary | ICD-10-CM

## 2011-01-05 DIAGNOSIS — O99891 Other specified diseases and conditions complicating pregnancy: Secondary | ICD-10-CM | POA: Insufficient documentation

## 2011-01-05 DIAGNOSIS — N949 Unspecified condition associated with female genital organs and menstrual cycle: Secondary | ICD-10-CM

## 2011-01-05 DIAGNOSIS — O9989 Other specified diseases and conditions complicating pregnancy, childbirth and the puerperium: Secondary | ICD-10-CM

## 2011-01-05 LAB — URINE MICROSCOPIC-ADD ON

## 2011-01-05 LAB — URINALYSIS, ROUTINE W REFLEX MICROSCOPIC
Glucose, UA: NEGATIVE mg/dL
Hgb urine dipstick: NEGATIVE
Protein, ur: NEGATIVE mg/dL
pH: 6 (ref 5.0–8.0)

## 2011-01-05 LAB — WET PREP, GENITAL
Trich, Wet Prep: NONE SEEN
Yeast Wet Prep HPF POC: NONE SEEN

## 2011-01-05 NOTE — Progress Notes (Signed)
Sharp shooting type pains in vagina onset yesterday, no vaginal discharge, had some bleeding around the end of June

## 2011-01-05 NOTE — ED Provider Notes (Addendum)
History     Chief Complaint  Patient presents with  . Vaginal Pain   HPIpt is [redacted] weeks pregnant and started having vaginal pain off and on- denies vaginal itching or burning, but feels like pressure, worse when she sits down.  She denies UTI symptoms.  She continues to have nausea and vomiting.  She can only eat Spam with syrup and cheese.  She denis diarrhea or constipation.  She denies fever or chills.  She has not taken anything for the pain.  She had some bleeding in June and had doppler with FHR.  She has not had any other complications with her pregnancy.    Past Medical History  Diagnosis Date  . No pertinent past medical history     Past Surgical History  Procedure Date  . Tonsillectomy   . Wisdom tooth extraction   . Dilation and curettage of uterus     No family history on file.  History  Substance Use Topics  . Smoking status: Never Smoker   . Smokeless tobacco: Not on file  . Alcohol Use: No    Allergies:  Allergies  Allergen Reactions  . Penicillins   . Sulfa Antibiotics   . Latex Itching and Rash    Prescriptions prior to admission  Medication Sig Dispense Refill  . prenatal vitamin w/FE, FA (PRENATAL 1 + 1) 27-1 MG TABS Take 1 tablet by mouth daily.          ROS Physical Exam   Blood pressure 150/78, pulse 105, temperature 98.6 F (37 C), temperature source Oral, resp. rate 18, height 5' 6.5" (1.689 m), weight 343 lb 9.6 oz (155.856 kg).  Physical Exam  Constitutional: She is oriented to person, place, and time. She appears well-developed and well-nourished.  HENT:  Head: Normocephalic.  Eyes: Pupils are equal, round, and reactive to light.  Neck: Normal range of motion.  Respiratory: Effort normal.  GI: Soft. She exhibits no distension. There is no tenderness.  Genitourinary: Vagina normal.       No edema noted; small amount of white discharge in vault; cervix closed, clean, nontender; uterus and adnexa unable to appreciate due to habitus-  nontender  Musculoskeletal: Normal range of motion.  Neurological: She is alert and oriented to person, place, and time. She has normal reflexes.  Skin: Skin is warm and dry.  Psychiatric: She has a normal mood and affect.    MAU Course  Procedures pevic exam performed; FHR not audbile with doppler- will confirm with limited u/s MDM Dr. Aldona Bar called- we will see pt for him Normal wet prep and normal u/s confirming viability  Assessment and Plan  Vaginal pain in pregnancy Viable IUP  Kimberly [name] 01/05/2011, 4:13 PM

## 2011-01-05 NOTE — Plan of Care (Addendum)
Dr. Aldona Bar called and asked that mid-level provider see pt.

## 2011-01-07 ENCOUNTER — Ambulatory Visit (HOSPITAL_COMMUNITY)
Admission: RE | Admit: 2011-01-07 | Discharge: 2011-01-07 | Disposition: A | Discharge status: Home / Self Care | Payer: Medicaid Other | Source: Ambulatory Visit | Attending: Obstetrics and Gynecology | Admitting: Obstetrics and Gynecology

## 2011-01-07 DIAGNOSIS — O368390 Maternal care for abnormalities of the fetal heart rate or rhythm, unspecified trimester, not applicable or unspecified: Secondary | ICD-10-CM | POA: Insufficient documentation

## 2011-01-07 LAB — GC/CHLAMYDIA PROBE AMP, GENITAL: GC Probe Amp, Genital: NEGATIVE

## 2011-01-10 NOTE — Progress Notes (Deleted)
Pt states "started having sharp vaginal pain since yest"

## 2011-01-10 NOTE — Progress Notes (Deleted)
Pt states "started having sharp vaginal pain since yest" 

## 2011-01-11 ENCOUNTER — Telehealth (HOSPITAL_COMMUNITY): Payer: Self-pay | Admitting: MS"

## 2011-01-11 NOTE — Telephone Encounter (Signed)
Attempted to call patient regarding results of Integrated Screening. Only listed phone number rang to automated message that "the current [employer] customer is unavailable." Patient has follow-up ultrasound planned in [facility] on 01/16/11.

## 2011-01-16 ENCOUNTER — Encounter (HOSPITAL_COMMUNITY): Payer: Self-pay

## 2011-01-16 ENCOUNTER — Ambulatory Visit (HOSPITAL_COMMUNITY)
Admission: RE | Admit: 2011-01-16 | Discharge: 2011-01-16 | Disposition: A | Discharge status: Home / Self Care | Payer: Medicaid Other | Source: Ambulatory Visit | Attending: Obstetrics and Gynecology | Admitting: Obstetrics and Gynecology

## 2011-01-16 ENCOUNTER — Other Ambulatory Visit (HOSPITAL_COMMUNITY): Payer: Self-pay | Admitting: Obstetrics and Gynecology

## 2011-01-16 VITALS — BP 138/62 | HR 89 | Wt 348.0 lb

## 2011-01-16 DIAGNOSIS — Z363 Encounter for antenatal screening for malformations: Secondary | ICD-10-CM | POA: Insufficient documentation

## 2011-01-16 DIAGNOSIS — O280 Abnormal hematological finding on antenatal screening of mother: Secondary | ICD-10-CM | POA: Insufficient documentation

## 2011-01-16 DIAGNOSIS — Z139 Encounter for screening, unspecified: Secondary | ICD-10-CM

## 2011-01-16 DIAGNOSIS — Z1389 Encounter for screening for other disorder: Secondary | ICD-10-CM | POA: Insufficient documentation

## 2011-01-16 DIAGNOSIS — E669 Obesity, unspecified: Secondary | ICD-10-CM | POA: Insufficient documentation

## 2011-01-16 DIAGNOSIS — O358XX0 Maternal care for other (suspected) fetal abnormality and damage, not applicable or unspecified: Secondary | ICD-10-CM | POA: Insufficient documentation

## 2011-01-16 NOTE — Progress Notes (Signed)
See ultrasound report in [facility] 

## 2011-01-16 NOTE — Progress Notes (Signed)
Genetic Counseling  High-Risk Gestation Note  Appointment Date:  01/16/2011 Referred By: Loney Laurence, MD Date of Birth:  1991/03/13 Partner:      Pregnancy History: G3P0020 Estimated Date of Delivery: 06/20/11 Estimated Gestational Age: [redacted]w[redacted]d  I briefly met with [name] and her partner for genetic counseling regarding an elevated MSAFP of 3.95 MoMs based on Integrated screening through [employer].    Kimberly [name] was counseled regarding the elevation of MSAFP and the associated 1 in 28 risk for a fetal open neural tube defect.   We reviewed ONTDs, the typical multifactorial etiology, and variable prognosis.  In addition, we discussed additional explanations for an elevated MSAFP including: normal variation, twins, feto-maternal bleeding, a gestational dating error, abdominal wall defects, kidney differences, oligohydramnios, and placental problems.  We discussed that an unexplained elevation of MSAFP is associated with an increased risk for third trimester complications including: prematurity, low birth weight, and pre-eclampsia.    The MSDIA was atypically elevated based on the Integrated screening result.  We discussed that this finding is also associated with an increased risk for poor pregnancy outcome including the above discussed complications.  We reviewed additional available screening and diagnostic options including detailed ultrasound and amniocentesis.  We discussed the risks, limitations, and benefits of each.  After thoughtful consideration of these options, Kimberly [name] elected to have ultrasound, but declined amniocentesis.  A complete ultrasound was performed today and the report will be sent under separate cover.  She understands that ultrasound cannot rule out all birth defects or genetic syndromes.  However, she was counseled that 80-90% of fetuses with ONTDs can be detected by detailed 2nd trimester ultrasound, when well visualized.   I  counseled the patient for approximately 20 minutes regarding the above risks and available options.     Kimberly [name] 01/16/2011

## 2011-02-06 ENCOUNTER — Other Ambulatory Visit (HOSPITAL_COMMUNITY): Payer: Self-pay | Admitting: Obstetrics and Gynecology

## 2011-02-06 ENCOUNTER — Ambulatory Visit (HOSPITAL_COMMUNITY)
Admission: RE | Admit: 2011-02-06 | Discharge: 2011-02-06 | Disposition: A | Discharge status: Home / Self Care | Payer: Medicaid Other | Source: Ambulatory Visit | Attending: Obstetrics and Gynecology | Admitting: Obstetrics and Gynecology

## 2011-02-06 DIAGNOSIS — O280 Abnormal hematological finding on antenatal screening of mother: Secondary | ICD-10-CM

## 2011-02-06 DIAGNOSIS — Z3689 Encounter for other specified antenatal screening: Secondary | ICD-10-CM | POA: Insufficient documentation

## 2011-02-06 DIAGNOSIS — E669 Obesity, unspecified: Secondary | ICD-10-CM | POA: Insufficient documentation

## 2011-02-06 DIAGNOSIS — Z139 Encounter for screening, unspecified: Secondary | ICD-10-CM

## 2011-02-06 DIAGNOSIS — O99210 Obesity complicating pregnancy, unspecified trimester: Secondary | ICD-10-CM | POA: Insufficient documentation

## 2011-02-06 NOTE — Progress Notes (Signed)
Patient seen for ultrasound only appointment today.  Please see [facility] report for details.  

## 2011-03-05 ENCOUNTER — Other Ambulatory Visit (HOSPITAL_COMMUNITY): Payer: Self-pay | Admitting: Obstetrics and Gynecology

## 2011-03-05 ENCOUNTER — Ambulatory Visit (HOSPITAL_COMMUNITY)
Admission: RE | Admit: 2011-03-05 | Discharge: 2011-03-05 | Disposition: A | Discharge status: Home / Self Care | Payer: Medicaid Other | Source: Ambulatory Visit | Attending: Obstetrics and Gynecology | Admitting: Obstetrics and Gynecology

## 2011-03-05 DIAGNOSIS — Z3689 Encounter for other specified antenatal screening: Secondary | ICD-10-CM | POA: Insufficient documentation

## 2011-03-05 DIAGNOSIS — O280 Abnormal hematological finding on antenatal screening of mother: Secondary | ICD-10-CM

## 2011-03-05 DIAGNOSIS — O99210 Obesity complicating pregnancy, unspecified trimester: Secondary | ICD-10-CM | POA: Insufficient documentation

## 2011-03-05 DIAGNOSIS — O99891 Other specified diseases and conditions complicating pregnancy: Secondary | ICD-10-CM

## 2011-03-05 DIAGNOSIS — E669 Obesity, unspecified: Secondary | ICD-10-CM | POA: Insufficient documentation

## 2011-03-05 NOTE — Progress Notes (Signed)
Patient seen for ultrasound appointment today.  Please see [facility] report for details.  

## 2011-03-22 ENCOUNTER — Encounter (HOSPITAL_COMMUNITY): Payer: Self-pay | Admitting: *Deleted

## 2011-03-26 ENCOUNTER — Encounter (HOSPITAL_COMMUNITY): Payer: Self-pay

## 2011-03-26 ENCOUNTER — Inpatient Hospital Stay (HOSPITAL_COMMUNITY)
Admission: AD | Admit: 2011-03-26 | Discharge: 2011-04-13 | DRG: 765 | Disposition: A | Discharge status: Home / Self Care | Payer: Medicaid Other | Source: Ambulatory Visit | Attending: Obstetrics and Gynecology | Admitting: Obstetrics and Gynecology

## 2011-03-26 ENCOUNTER — Inpatient Hospital Stay (HOSPITAL_COMMUNITY)
Admission: AD | Admit: 2011-03-26 | Discharge: 2011-03-26 | Disposition: A | Discharge status: Home / Self Care | Payer: Medicaid Other | Source: Ambulatory Visit | Attending: Obstetrics and Gynecology | Admitting: Obstetrics and Gynecology

## 2011-03-26 ENCOUNTER — Other Ambulatory Visit (HOSPITAL_COMMUNITY): Payer: Self-pay | Admitting: Obstetrics and Gynecology

## 2011-03-26 ENCOUNTER — Ambulatory Visit (HOSPITAL_COMMUNITY)
Admission: RE | Admit: 2011-03-26 | Discharge: 2011-03-26 | Disposition: A | Discharge status: Home / Self Care | Payer: Medicaid Other | Source: Ambulatory Visit | Attending: Obstetrics and Gynecology | Admitting: Obstetrics and Gynecology

## 2011-03-26 DIAGNOSIS — O1414 Severe pre-eclampsia complicating childbirth: Principal | ICD-10-CM | POA: Diagnosis not present

## 2011-03-26 DIAGNOSIS — E669 Obesity, unspecified: Secondary | ICD-10-CM | POA: Insufficient documentation

## 2011-03-26 DIAGNOSIS — O139 Gestational [pregnancy-induced] hypertension without significant proteinuria, unspecified trimester: Secondary | ICD-10-CM | POA: Insufficient documentation

## 2011-03-26 DIAGNOSIS — O365990 Maternal care for other known or suspected poor fetal growth, unspecified trimester, not applicable or unspecified: Secondary | ICD-10-CM | POA: Insufficient documentation

## 2011-03-26 DIAGNOSIS — O280 Abnormal hematological finding on antenatal screening of mother: Secondary | ICD-10-CM

## 2011-03-26 DIAGNOSIS — Z3689 Encounter for other specified antenatal screening: Secondary | ICD-10-CM | POA: Insufficient documentation

## 2011-03-26 DIAGNOSIS — IMO0002 Reserved for concepts with insufficient information to code with codable children: Secondary | ICD-10-CM

## 2011-03-26 HISTORY — DX: Gestational (pregnancy-induced) hypertension without significant proteinuria, unspecified trimester: O13.9

## 2011-03-26 HISTORY — DX: Unspecified ovarian cyst, unspecified side: N83.209

## 2011-03-26 LAB — COMPREHENSIVE METABOLIC PANEL
Alkaline Phosphatase: 115 U/L (ref 39–117)
BUN: 5 mg/dL — ABNORMAL LOW (ref 6–23)
Chloride: 104 mEq/L (ref 96–112)
Creatinine, Ser: <0.47 mg/dL — ABNORMAL LOW (ref 0.50–1.10)
Glucose, Bld: 71 mg/dL (ref 70–99)
Potassium: 3.7 mEq/L (ref 3.5–5.1)
Total Bilirubin: 0.2 mg/dL — ABNORMAL LOW (ref 0.3–1.2)

## 2011-03-26 LAB — CBC
Platelets: 287 10*3/uL (ref 150–400)
RBC: 4.64 MIL/uL (ref 3.87–5.11)
RDW: 14.4 % (ref 11.5–15.5)
WBC: 10 10*3/uL (ref 4.0–10.5)

## 2011-03-26 LAB — URIC ACID: Uric Acid, Serum: 5.7 mg/dL (ref 2.4–7.0)

## 2011-03-26 MED ORDER — IBUPROFEN 600 MG PO TABS
600.0000 mg | ORAL_TABLET | Freq: Once | ORAL | Status: AC
  Administered 2011-03-26: 600 mg via ORAL
  Filled 2011-03-26: qty 1

## 2011-03-26 MED ORDER — BETAMETHASONE SOD PHOS & ACET 6 (3-3) MG/ML IJ SUSP
12.0000 mg | Freq: Once | INTRAMUSCULAR | Status: AC
  Administered 2011-03-26: 12 mg via INTRAMUSCULAR
  Filled 2011-03-26: qty 2

## 2011-03-26 NOTE — ED Notes (Signed)
24hr urine start time 1000 on 03/26/11.

## 2011-03-26 NOTE — ED Provider Notes (Signed)
Because of the guarded prognosis for significant prolongation the the pregnancy I will start the Betamethasone protocol today and complete tomorrow when she returns to MAU for follow up evaluation.

## 2011-03-26 NOTE — ED Provider Notes (Signed)
20 y.o. G4P0030  Estimated Date of Delivery: 06/20/11 at 27/[redacted] weeks gestation referred to MAU from my office for evaluation of hypertension in pregnancy.  Prenatal course complicated by fetal growth restriction and BMI of of 60.  She is being followed with serial ultrasounds at [facility].  Today her EFW was 780 gms which is <10th%ile.  Her PIH labs are wnl.  Her BPs in MAU are in the 150 - 160/ 85-95 range.  Her FHT is normal with 10/10 accels.  Her ultrasound this morning showed no reverse flow and the presence of end diastolic flow.  Her AFV was normal.  Fetus is in the cephalic presentation. She denies headache, abdominal pain, or scotomata.  Impression: PIH, FGR patient and fetus are stable at this time.  Plan:  Home to bedrest and to complete 24 hour urine collection.  Return to MAU tomorrow for BP and FHT monitoring and to bring in 24 urine protein and creatinine.  If patient remains stable and her urine shows no significant proteinuria or decrease renal function, she will return to [facility] in one week to repeat the umbilical cord dopler study.

## 2011-03-26 NOTE — Progress Notes (Signed)
Sent from office, BP up higher today.  Swelling in feet, baby not gaining weight. Tingling and swelling in hands.  Protein in urine. ? Placental function, may need to  Be delivered early.

## 2011-03-26 NOTE — Progress Notes (Signed)
Ultrasound in [facility]/EPIC.  Follow up U/S scheduled  BP today is 145/101

## 2011-03-26 NOTE — Progress Notes (Signed)
WAS HERE  THIS AM AT 11- 2PM FOR BP ISSUES- CAME DOWN-   IS COLLECTING  24 HR URINE- HAS BROWN JUG.    DR Arlyce Dice-    SAW PT.   CALLED DR Dareen Piano- TOLD HIM ABOUT  H/A-  HE TOLD HER TO TAKE 3 ADVIL - AND SHE DID NOT.

## 2011-03-27 LAB — CREATININE CLEARANCE, URINE, 24 HOUR
Creatinine Clearance: 220 mL/min — ABNORMAL HIGH (ref 75–115)
Creatinine, 24H Ur: 1897 mg/d — ABNORMAL HIGH (ref 700–1800)

## 2011-03-27 MED ORDER — ZOLPIDEM TARTRATE 10 MG PO TABS
10.0000 mg | ORAL_TABLET | Freq: Every evening | ORAL | Status: DC | PRN
  Administered 2011-03-27 – 2011-03-31 (×6): 10 mg via ORAL
  Filled 2011-03-27 (×2): qty 1
  Filled 2011-03-27: qty 2
  Filled 2011-03-27 (×4): qty 1

## 2011-03-27 MED ORDER — DOCUSATE SODIUM 100 MG PO CAPS
100.0000 mg | ORAL_CAPSULE | Freq: Every day | ORAL | Status: DC
  Administered 2011-03-27 – 2011-04-03 (×8): 100 mg via ORAL
  Filled 2011-03-27 (×12): qty 1

## 2011-03-27 MED ORDER — CALCIUM CARBONATE ANTACID 500 MG PO CHEW: 2.0000 | CHEWABLE_TABLET | ORAL | Status: DC | PRN

## 2011-03-27 MED ORDER — PANTOPRAZOLE SODIUM 40 MG PO TBEC
40.0000 mg | DELAYED_RELEASE_TABLET | Freq: Every day | ORAL | Status: DC
  Administered 2011-03-27 – 2011-04-09 (×14): 40 mg via ORAL
  Filled 2011-03-27 (×15): qty 1

## 2011-03-27 MED ORDER — ACETAMINOPHEN 325 MG PO TABS
650.0000 mg | ORAL_TABLET | ORAL | Status: DC | PRN
  Administered 2011-03-30 (×2): 650 mg via ORAL
  Filled 2011-03-27 (×2): qty 2

## 2011-03-27 MED ORDER — BETAMETHASONE SOD PHOS & ACET 6 (3-3) MG/ML IJ SUSP
12.5000 mg | Freq: Once | INTRAMUSCULAR | Status: AC
  Administered 2011-03-27: 12.5 mg via INTRAMUSCULAR
  Filled 2011-03-27: qty 2.1

## 2011-03-27 MED ORDER — PRENATAL PLUS 27-1 MG PO TABS
1.0000 | ORAL_TABLET | Freq: Every day | ORAL | Status: DC
  Administered 2011-03-27 – 2011-04-08 (×13): 1 via ORAL
  Filled 2011-03-27 (×14): qty 1

## 2011-03-27 NOTE — Progress Notes (Signed)
Antenatal PN Hospital Day #2  20yo G4P0030 AA female at 27+6 with Elevated BPs and IUGR here for BP monitoring, 24 hour urine  S) Pt continues to report a headache. She denies visual changes or scotoma but does complain of heart burn. Normally she takes zantac at home for heartburn. She feels active fetal movement and denies VB/LOF O) Filed Vitals:   03/27/11 0146 03/27/11 0228 03/27/11 0250 03/27/11 0801  BP: 161/85 162/63 169/80 155/73  Pulse: 84 83 83 96  Temp: 98.1 F (36.7 C)     TempSrc: Oral     Resp: 18 18 18    Height: 5' 7.5" (1.715 m)     Weight: 166.924 kg (368 lb)     AOx3, NAD Obese FHT: 140s reassuring with accelerations.  Will have occasional spontaneous deceleration of fetal heartrate. FHR with accelerations before and after. Reactive for 28 wks by 10 x 10 criteria Cvx deferred Toco: quiet Ultrasound 03/26/11: EFW 730 gm, 1lb 10oz (<10%). AFI WNL, Cephalic.  UA dopplers 4.69 97%tile, NO absent or reverse diastolic flow  A/P 1) Cont BR w/ BRP 2) 24 hr urine complete at 2pm 3) 2nd BMZ due at 2pm 4) Per MFM recommendation Q wk UA dopplers

## 2011-03-27 NOTE — H&P (Signed)
Pt is a 20 year old black female G4P0030 at 28 weeks who presents to ER c/o a severe HA.  Pt was evaluated in the ER earlier today for HTN.  She had normal PIH labs today.  Pt has been followed by MFM for IUGR.  Pt has a FMHx of PIH.  Pt was observed in the ER for 2 hours.  She was given Motrin 600mg  x 1 and had no relief.  Her B/Ps remain elevated.  She did have one dose of Betamethasone earlier today.  NST - reactive.  PE - Obese black female in NAD. 56/104 HEENT-wnl ABD- gravid, NT EXT-wnl  IMP/ Obese black female with preeclampsia          Headache Plan/ Admit

## 2011-03-28 LAB — URINALYSIS, ROUTINE W REFLEX MICROSCOPIC
Bilirubin Urine: NEGATIVE
Leukocytes, UA: NEGATIVE
Nitrite: NEGATIVE
Specific Gravity, Urine: 1.015 (ref 1.005–1.030)
Urobilinogen, UA: 0.2 mg/dL (ref 0.0–1.0)

## 2011-03-28 LAB — PROTEIN, URINE, 24 HOUR
Collection Interval-UPROT: 24 hours
Protein, Urine: 36 mg/dL
Urine Total Volume-UPROT: 1500 mL

## 2011-03-28 LAB — URINE MICROSCOPIC-ADD ON

## 2011-03-28 MED ORDER — NITROFURANTOIN MONOHYD MACRO 100 MG PO CAPS
100.0000 mg | ORAL_CAPSULE | Freq: Two times a day (BID) | ORAL | Status: DC
  Administered 2011-03-28 – 2011-04-06 (×20): 100 mg via ORAL
  Filled 2011-03-28 (×21): qty 1

## 2011-03-28 NOTE — Progress Notes (Addendum)
Antenatal PN  Hospital Day #3 20yo G28P0030 AA female at [redacted]w[redacted]d with Elevated BPs and IUGR here for BP monitoring, 24 hour urine  S) HA better today. Her heartburn was bad last night but better today.  Tol PO. She feels active fetal movement and denies VB/LOF.  C/o urinary cramping but no uterine ctxes. O)   Filed Vitals:   03/28/11 0459 03/28/11 0607 03/28/11 0655 03/28/11 0730  BP:    157/91  Pulse:    90  Temp:    98.3 F (36.8 C)  TempSrc:    Oral  Resp: 20 20 22 20   Height:      Weight:      BPs range 130-150s/80-90s  AOx3, NAD  Obese  FHT: 120s, gstv, NST R today.  At 6:12 had one severe variable decel to 60s for 90 sec.  At 7:02 had two mild variable decels.    Cvx deferred  Toco: quiet  Ultrasound 03/26/11: EFW 730 gm, 1lb 10oz (<10%). AFI WNL, Cephalic.  UA dopplers 4.69 97%tile, NO absent or reverse diastolic flow  Results for orders placed during the hospital encounter of 03/26/11 (from the past 24 hour(s))  PROTEIN, URINE, 24 HOUR     Status: Abnormal (Preliminary result)   Collection Time   03/27/11  2:00 PM      Component Value Range   Urine Total Volume-UPROT 1500     Collection Interval-UPROT 24     Protein, Urine PENDING     Protein, 24H Urine 540 (*) 50 - 100 (mg/day)  CREATININE CLEARANCE, URINE, 24 HOUR     Status: Abnormal   Collection Time   03/27/11  2:00 PM      Component Value Range   Urine Total Volume-CRCL 1500     Collection Interval-CRCL 24     Creatinine, Urine 126.46     Creatinine 0.60  0.50 - 1.10 (mg/dL)   Creatinine, 16X Ur 0960 (*) 700 - 1800 (mg/day)   Creatinine Clearance 220 (*) 75 - 115 (mL/min)     A/P [redacted]w[redacted]d with pre-eclampsia.    1) Cont BR w/ BRP  2) 24 hr urine 540 mg protein/24hrs.  3) 2nd BMZ done. 4) Per MFM recommendation Q wk UA dopplers  5) Last PIH labs were normal, repeat tomorrow 6) check u/a for infection.

## 2011-03-28 NOTE — Progress Notes (Signed)
UR Chart review completed.  

## 2011-03-29 LAB — COMPREHENSIVE METABOLIC PANEL
ALT: 8 U/L (ref 0–35)
Alkaline Phosphatase: 105 U/L (ref 39–117)
BUN: 7 mg/dL (ref 6–23)
Chloride: 104 mEq/L (ref 96–112)
Glucose, Bld: 95 mg/dL (ref 70–99)
Potassium: 3.7 mEq/L (ref 3.5–5.1)
Sodium: 136 mEq/L (ref 135–145)
Total Bilirubin: 0.2 mg/dL — ABNORMAL LOW (ref 0.3–1.2)
Total Protein: 6.4 g/dL (ref 6.0–8.3)

## 2011-03-29 LAB — CBC
HCT: 33.2 % — ABNORMAL LOW (ref 36.0–46.0)
Hemoglobin: 10.5 g/dL — ABNORMAL LOW (ref 12.0–15.0)
RBC: 4.51 MIL/uL (ref 3.87–5.11)
WBC: 15.2 10*3/uL — ABNORMAL HIGH (ref 4.0–10.5)

## 2011-03-29 LAB — URIC ACID: Uric Acid, Serum: 5.5 mg/dL (ref 2.4–7.0)

## 2011-03-29 MED ORDER — SODIUM CHLORIDE 0.9 % IJ SOLN
3.0000 mL | Freq: Two times a day (BID) | INTRAMUSCULAR | Status: DC
  Administered 2011-03-29 – 2011-04-01 (×6): 3 mL via INTRAVENOUS

## 2011-03-29 NOTE — Progress Notes (Signed)
S:  Feels fine, denies headache, abdominal pain, visual changes.  O:  BP 150/85 range, PIH labs normal and unchanged, FHT reactive occasional decels on continuous monitoring which appear to be of no significance in view of normal tracing before and after decels.  A:  Stable  P:  Continue hospital observation for mild preeclampsia and FGR.  Will repeat cord doplers 10/16.

## 2011-03-29 NOTE — Progress Notes (Signed)
Pt requested that FHR monitors be removed

## 2011-03-30 NOTE — Progress Notes (Signed)
Dr Arlyce Dice reviewed tracing, ok to take off th e monitor for the night.

## 2011-03-30 NOTE — Progress Notes (Signed)
Feels fine, no headache.  BP's stable. Will reduce FHT monitoring to 2-3 hours per shift.

## 2011-03-31 MED ORDER — IBUPROFEN 600 MG PO TABS
600.0000 mg | ORAL_TABLET | Freq: Once | ORAL | Status: AC
  Administered 2011-03-31: 600 mg via ORAL
  Filled 2011-03-31: qty 1

## 2011-03-31 NOTE — Progress Notes (Signed)
Hardie Shackleton, RN assume care of patient, due to staffing down sizing.

## 2011-03-31 NOTE — Progress Notes (Signed)
BPs are stable and 150/80 range.  Patient voiced no complaints to me but latter asked nurse for something other than tylenol for a nagging headache that she has had off and on for several days.  Ibuprofen 600 mg given as a one time dose.  I have ordered a repeat 24 hour urine protein and creatinine clearance and PIH labs for 10/15.  If these results are reassuring, her blood pressure remains stable, and the repeat fetal umbilical cord dopler studies which are scheduled for 10/16 are good, it may be safe to follow her as an outpatient.

## 2011-04-01 LAB — CREATININE CLEARANCE, URINE, 24 HOUR
Collection Interval-CRCL: 24 hours
Creatinine, 24H Ur: 1676 mg/d (ref 700–1800)
Creatinine, Urine: 115.59 mg/dL
Creatinine: <0.47 mg/dL — ABNORMAL LOW (ref 0.50–1.10)

## 2011-04-01 LAB — COMPREHENSIVE METABOLIC PANEL
ALT: 8 U/L (ref 0–35)
AST: 14 U/L (ref 0–37)
Albumin: 2.6 g/dL — ABNORMAL LOW (ref 3.5–5.2)
Alkaline Phosphatase: 120 U/L — ABNORMAL HIGH (ref 39–117)
BUN: 6 mg/dL (ref 6–23)
Chloride: 102 mEq/L (ref 96–112)
Potassium: 3.8 mEq/L (ref 3.5–5.1)
Total Bilirubin: 0.4 mg/dL (ref 0.3–1.2)

## 2011-04-01 LAB — CBC
MCH: 23.2 pg — ABNORMAL LOW (ref 26.0–34.0)
MCHC: 31.6 g/dL (ref 30.0–36.0)
MCV: 73.4 fL — ABNORMAL LOW (ref 78.0–100.0)
Platelets: 265 10*3/uL (ref 150–400)
RDW: 14.4 % (ref 11.5–15.5)

## 2011-04-01 NOTE — Progress Notes (Signed)
No complaints.  Denies HA, vision change, RUQ pain.  Patient is eating and voiding. No LOF, VB, ctx.  +FM  Filed Vitals:   03/31/11 2120 03/31/11 2220 04/01/11 0002 04/01/11 0823  BP:   153/85 154/95  Pulse:   84 100  Temp:   97.9 F (36.6 C) 98.6 F (37 C)  TempSrc:   Oral   Resp: 20 20 20 20   Height:      Weight:        Gen: obese female, NAD No RUQ tenderness No calf tenderness  Lab Results  Component Value Date   WBC 11.9* 04/01/2011   HGB 10.9* 04/01/2011   HCT 34.5* 04/01/2011   MCV 73.4* 04/01/2011   PLT 265 04/01/2011    A/Positive/-- (06/27 0000)  A/P 20 yo @ 28.4 admitted severe preeclampsia remote from term No HTN meds Awaiting 24 hr urine results and repeat CMP. Repeat dopplers tomorrow. Monitor symptoms and BPs. S/p steroids. Routine care.     Philip Aspen

## 2011-04-02 ENCOUNTER — Inpatient Hospital Stay (HOSPITAL_COMMUNITY)
Admission: RE | Admit: 2011-04-02 | Discharge: 2011-04-02 | Disposition: A | Discharge status: Home / Self Care | Payer: Medicaid Other | Source: Ambulatory Visit | Attending: Obstetrics and Gynecology | Admitting: Obstetrics and Gynecology

## 2011-04-02 DIAGNOSIS — IMO0002 Reserved for concepts with insufficient information to code with codable children: Secondary | ICD-10-CM

## 2011-04-02 LAB — PROTEIN, URINE, 24 HOUR
Protein, 24H Urine: 841 mg/d — ABNORMAL HIGH (ref 50–100)
Protein, Urine: 58 mg/dL

## 2011-04-02 NOTE — Progress Notes (Addendum)
Sharee Pimple, CRNA came over to attempt IV start for routine site change. One unsuccessful attempt.  Pat Patrick, CRNA came to pt's room to look for IV site but did not attampt. Dr Henderson Cloud given report of unsuccessful IV start and CRNA's recommendation for PICC line

## 2011-04-02 NOTE — Progress Notes (Signed)
Stable this am- no pre-eclampsia complaints.  +FM, no lof or ctxes.  Filed Vitals:   04/01/11 2058 04/01/11 2200 04/01/11 2302 04/02/11 0004  BP:    152/91  Pulse:    79  Temp:    98.2 F (36.8 C)  TempSrc:    Oral  Resp: 20 20 20 20   Height:      Weight:       Lungs CTA Cor RRR Abd soft, gravid, nt. Ex scds. FHTS:  130s, NST R, gstv.  Still occasional moderate to severe variable decels. Toco occ  Results for orders placed during the hospital encounter of 03/26/11 (from the past 48 hour(s))  PROTEIN, URINE, 24 HOUR     Status: Abnormal (Preliminary result)   Collection Time   03/31/11  8:45 AM      Component Value Range Comment   Urine Total Volume-UPROT 1450      Collection Interval-UPROT 24      Protein, Urine PENDING      Protein, 24H Urine 841 (*) 50 - 100 (mg/day)   CREATININE CLEARANCE, URINE, 24 HOUR     Status: Abnormal   Collection Time   03/31/11  8:45 AM      Component Value Range Comment   Urine Total Volume-CRCL 1450      Collection Interval-CRCL 24      Creatinine, Urine 115.59      Creatinine <0.47 (*) 0.50 - 1.10 (mg/dL)    Creatinine, 62X Ur 1676  700 - 1800 (mg/day)    Creatinine Clearance NOT CALCULATED  75 - 115 (mL/min)   COMPREHENSIVE METABOLIC PANEL     Status: Abnormal   Collection Time   04/01/11  5:10 AM      Component Value Range Comment   Sodium 133 (*) 135 - 145 (mEq/L)    Potassium 3.8  3.5 - 5.1 (mEq/L)    Chloride 102  96 - 112 (mEq/L)    CO2 26  19 - 32 (mEq/L)    Glucose, Bld 95  70 - 99 (mg/dL)    BUN 6  6 - 23 (mg/dL)    Creatinine, Ser <5.28 (*) 0.50 - 1.10 (mg/dL) REPEATED TO VERIFY   Calcium 9.4  8.4 - 10.5 (mg/dL)    Total Protein 6.4  6.0 - 8.3 (g/dL)    Albumin 2.6 (*) 3.5 - 5.2 (g/dL)    AST 14  0 - 37 (U/L)    ALT 8  0 - 35 (U/L)    Alkaline Phosphatase 120 (*) 39 - 117 (U/L)    Total Bilirubin 0.4  0.3 - 1.2 (mg/dL)    GFR calc non Af Amer NOT CALCULATED  >90 (mL/min)    GFR calc Af Amer NOT CALCULATED  >90  (mL/min)   CBC     Status: Abnormal   Collection Time   04/01/11  5:10 AM      Component Value Range Comment   WBC 11.9 (*) 4.0 - 10.5 (K/uL)    RBC 4.70  3.87 - 5.11 (MIL/uL)    Hemoglobin 10.9 (*) 12.0 - 15.0 (g/dL)    HCT 41.3 (*) 24.4 - 46.0 (%)    MCV 73.4 (*) 78.0 - 100.0 (fL)    MCH 23.2 (*) 26.0 - 34.0 (pg)    MCHC 31.6  30.0 - 36.0 (g/dL)    RDW 01.0  27.2 - 53.6 (%)    Platelets 265  150 - 400 (K/uL)   URIC ACID     Status:  Normal   Collection Time   04/01/11  5:10 AM      Component Value Range Comment   Uric Acid, Serum 5.5  2.4 - 7.0 (mg/dL)     [name]: 20 y.o. [redacted]w[redacted]d with preeclampsia.  1.  Fetal testing- still variables.  For dopplers and BPP today. 2.  Proteinuria now up to 861 mg/ 24 hours.  No signs of HELLP. 3.  BPs continue to be elevated but not into the range of needing treatment.  Kimberly [name]

## 2011-04-02 NOTE — Progress Notes (Signed)
UR Chart review completed.  

## 2011-04-02 NOTE — Progress Notes (Signed)
Dr Malen Gauze from anesthesia in to attempt to insert NSL. Unsuccessful on two attempts, Recommends PICC line for the patient.

## 2011-04-03 ENCOUNTER — Inpatient Hospital Stay (HOSPITAL_COMMUNITY): Payer: Medicaid Other

## 2011-04-03 NOTE — Progress Notes (Signed)
Antenatal Progress Note  20 yo G4P0030 @ 28+6 with pre-eclampsia, IUGR, and elevated UA dopplers  S: Feeling well. Denies HA, vision changes, RUQ/epigastric pain. Active FM, no VB/LOF/Ctxns O:  Filed Vitals:   04/02/11 2034 04/02/11 2248 04/02/11 2252 04/03/11 0753  BP: 138/95 175/79 152/85 152/92  Pulse: 89  83 98  Temp: 98.1 F (36.7 C)  97.8 F (36.6 C) 98 F (36.7 C)  TempSrc: Oral   Oral  Resp: 20  20 20   Height:      Weight:       AOX3, NAD Obese, soft NT/ND FHT: not currently being monitored Cvx: deferred Toco: Not currently monitored  BPP: 10/16 6/8, -2 for non-sustained FBM. UA dopplers 5.39 > 97%tile, no reverse or absent EDF  A/P: 20 yo G4P0030 @ 28+6 with pre-eclampsia, IUGR, and elevated UA dopplers 1) Continue hospital BR w/ BRP 2) Repeat BPP today 3) Monitoring continuously per MFM recommendation from 10/16 4) Last 24 hour urine 10/14. Will plan to repeat Q week unless status changes. Next due to start Saturday 10/20 5) Repeat dopplers 10/19 per MFM recommendation 6) No antihypertensive medications at this time.

## 2011-04-03 NOTE — Progress Notes (Signed)
RN remains at the bedside attempting to location continuous fetal tracing on EFM.  (From 1621 to 1643). Assist. Requested.

## 2011-04-03 NOTE — Progress Notes (Signed)
Pt requests that the fetal monitors be off while she eats and does her homework.  reminded her of the MD orders and that she would take the responsibility if something happens to the baby.  Monitors removed.

## 2011-04-03 NOTE — Progress Notes (Signed)
Pt. To MFM for follow-up BPP (BPP 6/8 on 10/16) via wc.

## 2011-04-03 NOTE — Progress Notes (Signed)
Discussed with Dr. Tenny Craw, current fetal monitoring, (order received) increased BP (150's over 90's), (notified MD only if BP systolic 160 or greater or diastolic of 110 or greater.  IV status (several IV sticks by CRNA, anesthesia, L&D RN and antenatal RN on yesterday (10/16) - suggested PIC line - not at this time per MD.

## 2011-04-03 NOTE — Progress Notes (Signed)
RN to the bedside to place patient on continuous fetal monitoring - fetal heart beat 132 bpm - difficult to maintain continuous fetal tracing.  Pt. Currently sitting in rocking chair eating breakfast.  Pt. To be placed in bed and monitors attached after patient eats her breakfast.

## 2011-04-03 NOTE — Progress Notes (Signed)
Pt. Currently sitting on side of bed, back from MFM. Desires to eat lunch sitting in rocking chair.  Unable to monitor fetus with continuous U/S.  Pt. States, "I will call when I am finish eating lunch; we can put the baby back on the monitor at that time."

## 2011-04-03 NOTE — Progress Notes (Signed)
RN at the bedside from 1804 to 1845, searching for an abd. Location (EFM)  that would provide continuous monitoring of fetus. BMI 59.6.

## 2011-04-03 NOTE — Progress Notes (Signed)
Pt. To U/S for scheduled BPP.

## 2011-04-04 ENCOUNTER — Encounter (HOSPITAL_COMMUNITY): Payer: Self-pay | Admitting: Anesthesiology

## 2011-04-04 MED ORDER — LORAZEPAM 1 MG PO TABS
0.5000 mg | ORAL_TABLET | ORAL | Status: AC
  Administered 2011-04-05: 0.5 mg via ORAL
  Filled 2011-04-04: qty 1

## 2011-04-04 NOTE — Progress Notes (Signed)
Addendum to progress note.  D/W anesthesia and patient- will request PICC team to place line today.   Pt requests respite from continuous monitoring- FHTs had prolonged decel last night but stable and reactive today. OK for off for 1 hour.

## 2011-04-04 NOTE — Progress Notes (Signed)
This is a pleasant female at [redacted] week gestational age  With difficult IV access.  Dr. Henderson Cloud requested IV access evaluation.  In light of her long standing difficulty with IV placement and her likely need for ongoing access, we recommend a PICC line.  If emergent access is needed, we are happy to place a CVC.

## 2011-04-04 NOTE — Progress Notes (Addendum)
20 y.o. G4P0030 [redacted]w[redacted]d HD# 9 admitted for 27 WEEKS RETURNING WITH BAD HA .  Pt currently stable with no c/o preeclampsia symptoms or labor.  Good FM.  @VITALS2 @  Lungs CTA Cor RRR Abd  Soft, gravid, nontender Ex SCDs FHTs  130s, good short term variability, NST R, occ variable Toco  none    A:  HD 9   [redacted]w[redacted]d  preeclampsia.  P: A/P: 20 yo G4P0030 @ 28+6 with pre-eclampsia, IUGR, and elevated UA dopplers  1) Continue hospital BR w/ BRP  2) Repeat BPP yesterday 8/8.  Last dopplers good on 10-16, no reverse or absent flow.  3) Monitoring continuously per MFM recommendation from 10/16  4) Last 24 hour urine 10/14. Will plan to repeat Q week unless status changes. Next due to start Saturday 10/20  5) Repeat dopplers 10/19 per MFM recommendation  6) No antihypertensive medications at this time. 7) Multiple attempts to obtain IV access have failed.  Will d/w anesthesia need for PICC line.       Shawnelle Spoerl A

## 2011-04-05 ENCOUNTER — Ambulatory Visit (HOSPITAL_COMMUNITY)
Admit: 2011-04-05 | Discharge: 2011-04-05 | Disposition: A | Discharge status: Home / Self Care | Payer: Medicaid Other | Attending: Obstetrics and Gynecology | Admitting: Obstetrics and Gynecology

## 2011-04-05 ENCOUNTER — Inpatient Hospital Stay (HOSPITAL_COMMUNITY): Payer: Medicaid Other

## 2011-04-05 DIAGNOSIS — O4700 False labor before 37 completed weeks of gestation, unspecified trimester: Secondary | ICD-10-CM | POA: Insufficient documentation

## 2011-04-05 DIAGNOSIS — O10019 Pre-existing essential hypertension complicating pregnancy, unspecified trimester: Secondary | ICD-10-CM | POA: Insufficient documentation

## 2011-04-05 MED ORDER — SODIUM CHLORIDE 0.9 % IJ SOLN
10.0000 mL | Freq: Two times a day (BID) | INTRAMUSCULAR | Status: DC
  Administered 2011-04-05 – 2011-04-09 (×6): 10 mL via INTRAVENOUS
  Filled 2011-04-05 (×11): qty 10

## 2011-04-05 MED ORDER — SODIUM CHLORIDE 0.9 % IJ SOLN
10.0000 mL | Freq: Two times a day (BID) | INTRAMUSCULAR | Status: DC
  Filled 2011-04-05 (×2): qty 10

## 2011-04-05 NOTE — Progress Notes (Signed)
Admitted at  [redacted] weeks gestation, now [redacted] weeks gestation, with preeclampsia and IUGR.  Height  67.5 " Weight 360 lbs pre-pregnancy weight unknown.Pre-pregnancy  BMI 55.5, morbid obesity  IBW 137 lbs  Total weight gain unknown. Weight gain goals11- 20 lbs.   Estimated needs: 23-2500 kcal/day, 96-106 grams protein/day, 2.5 liters fluid/day regular diet tolerated well, appetite good. No abnormal nutrition related labs. Serum glucose wnl  Nutrition Dx: Increased nutrient needs r/t pregnancy and fetal growth requirements aeb [redacted] weeks gestation.  No educational needs assessed at this time.

## 2011-04-05 NOTE — Progress Notes (Signed)
Procedure completed.  PICC line placed.  Pt removed from EFM/toco at this time for transfer to RR to await [facility] for transport back to [facility].

## 2011-04-05 NOTE — Progress Notes (Signed)
Pt awaiting [facility] transport back to [facility].  Doppler tones assessed x 2 min.  FHTs 140's.  No decelerations noted.

## 2011-04-05 NOTE — Progress Notes (Addendum)
Pt arrived to [facility] interventional radiology via [facility] for PICC line placement.  Pt comfortable and relaxed upon arrival.  Pt denies UCs, LOF, or vag bldg.  Pt states normal fetal movement.  Pt placed on EFM/toco at this time.  FHT 140's.

## 2011-04-05 NOTE — Progress Notes (Signed)
Pt going to [facility] today for [facility] line placement.  Attempt at [facility] unsuccessful yesterday. Anesthesia is concerned about lack of access if STAT C/S required.  Pt had U/S today. BPP 8/8, Nl AFI, Dopplers - 97.5%, no absent or reverse flow. PLAN/ will continue to monitor.

## 2011-04-05 NOTE — Progress Notes (Signed)
[facility] arrived to pick up pt for transport to [facility].  Pt placed on stretcher in stable condition.

## 2011-04-06 MED ORDER — LACTATED RINGERS IV SOLN
INTRAVENOUS | Status: DC
  Administered 2011-04-06 – 2011-04-09 (×2): via INTRAVENOUS

## 2011-04-06 NOTE — Progress Notes (Signed)
RN called Dr. Dareen Piano, no answer, RN left message on answering machine (cell no. [phone]). Informed Dr. Dareen Piano of pt.'s desire to go home, not wanting fetal or toco monitoring, crying due to clotted PICC (purple lumen). (PICC line that was placed yesterday.) Variables (variables that returned to baseline with no nursing interventions.)

## 2011-04-06 NOTE — Progress Notes (Signed)
RN to the bedside to attempt flush of PICC located in right arm.  Red lumen flushed with 10ml NS; purple lumen would not flush - repositioned pt.; would not flush.  House supervisor notified - plan to call in PICC team to evaluate PICC line. Rn inquired about dressing change - per HO - dressing changes takes place on Wednesdays unless there is a concern with the dressing.  Dressing clean/dry/intact.

## 2011-04-06 NOTE — Progress Notes (Signed)
Pt had PIC line placed at [facility] yesterday. No complications. Started 24 hour urine today. Good FM. No HAs.

## 2011-04-06 NOTE — Progress Notes (Signed)
Sitting on side of bed; just come from BR for void.

## 2011-04-06 NOTE — Progress Notes (Signed)
RN to the bedside - locate position for continuous fetal tracing. (FHR Via doppler 143 bpm)

## 2011-04-06 NOTE — Progress Notes (Signed)
Pt. Removed from EFM and Toco; per her request. Pt. Verbalized feelings of anger - angry b/c the PICC she received yesterday is now clotted (purple port not flushing - positional changes attempted). MD notified - order received. Pt. Upset b/c she now has to be attached to an IV (order to run LR at [facility] with red port (red port flushed without complications). Dr. Dareen Piano to discuss the possible use of TPA (per IV team recommendation) with MFM.  At current time - no order for TPA.  Pt. Requested to be removed from EFM and toco, "I'm tired of being monitored."  Explained to patient that her baby was having variables (decels with return to baseline - without nursing interventions) but nurse cannot predict when her baby would have a decel and heart rate not return to baseline without her being monitored.  Pt. Stated, "OK", I still want you to removed the monitors.  Monitors removed. Pt.'s mother and father at the bedside encouraging patient to continue with fetal monitoring. Pt. Crying - wanting to go home.  RN encouraged and trying to console patient, but pt. Wants to be along with her family.  RN encouraged pt. To call when she wants to be put back on the monitor.  Rn to notify MD.

## 2011-04-06 NOTE — Progress Notes (Signed)
RN at the bedside from 1155 - present time - attempting to obtain continuous fetal tracing on fetus. Unsuccessful.  FHR via doppler 147 bpm.  RN will continue to try and locate a position for continuous fetal tracing.

## 2011-04-06 NOTE — Progress Notes (Signed)
Pt. Off monitor up to BR for a.m. ADLs.

## 2011-04-06 NOTE — Progress Notes (Signed)
Dr. Dareen Piano called back to have MFM paged - to seek recommendation.  RN to page MFM and give them Dr. Ewell Poe cell no. - physician to physician consult.

## 2011-04-06 NOTE — Progress Notes (Signed)
Red port flushed with out complications; Purple port difficult to flush - nurse house supervisor notified.

## 2011-04-06 NOTE — Progress Notes (Addendum)
Dr. Dareen Piano notified of purple lumen not flushing; RN suggested LR at [facility] at 20ml for red lumen (flushed well)  - order received.  In speaking with HO who contacted IV therapy team - they will come in tomorrow to use TPA on purple lumen if approved by pt's doctor.  Information relayed to Dr. Dareen Piano - no  Order received.

## 2011-04-07 LAB — COMPREHENSIVE METABOLIC PANEL
Albumin: 2.6 g/dL — ABNORMAL LOW (ref 3.5–5.2)
BUN: 6 mg/dL (ref 6–23)
Calcium: 9.5 mg/dL (ref 8.4–10.5)
Chloride: 100 mEq/L (ref 96–112)
Creatinine, Ser: 0.53 mg/dL (ref 0.50–1.10)
Total Bilirubin: 0.3 mg/dL (ref 0.3–1.2)

## 2011-04-07 LAB — CBC
HCT: 35.9 % — ABNORMAL LOW (ref 36.0–46.0)
MCH: 24 pg — ABNORMAL LOW (ref 26.0–34.0)
MCHC: 32 g/dL (ref 30.0–36.0)
MCV: 74.9 fL — ABNORMAL LOW (ref 78.0–100.0)
Platelets: 222 10*3/uL (ref 150–400)
RDW: 14.9 % (ref 11.5–15.5)

## 2011-04-07 LAB — PROTIME-INR: Prothrombin Time: 13.7 seconds (ref 11.6–15.2)

## 2011-04-07 MED ORDER — ALTEPLASE 2 MG IJ SOLR
2.0000 mg | Freq: Once | INTRAMUSCULAR | Status: AC
  Administered 2011-04-07: 2 mg
  Filled 2011-04-07: qty 2

## 2011-04-07 NOTE — Progress Notes (Signed)
4098: Pt up to br at 0324, when back to bed unable to obtain continuous fhr monitoring- fetus very active (perceived by pt and heard on efm)- multiple position changes- pt prefers to lay Rt lateral. Obtained brief fhr's via efm of 140's.  Pt up around 0400 to have bm, then back to bed.  Multiple position changes- still unable to obtain continuous fhr- fetus still very active as perceived by mom.  At 0425, had pt change position to semi-fowler to obtain small strip to verify fetal well-being.  FHR 135 with moderate variability and 10x10 accels.  Pt states she does not want to lay on back and prefers to go back to Rt lateral.  Diamond Nickel, RN also in assisting to find continuous fhr throughout this time.  Cheral Marker

## 2011-04-07 NOTE — Progress Notes (Signed)
IV RN Terie Purser in, removed TPA and labs drawn.  Flushes well.

## 2011-04-07 NOTE — Progress Notes (Signed)
Pt had PICC line placed at [facility] on Friday.  Attempts to place one here were unsuccessful.  On Saturday one of the ports became occluded. Discussed options with IV therapy team, anesthesia, and MFM.  Options included 1) TPA, 2) removal and placement of another PICC in opposite arm, 3) removal and placement of central line, 4) removal and follow patient without IV access.  MFM stated that TPA has been used in pregnancy but that there was little data.  It would be preferable not to use TPA unless benefit outweighed the risk.  Anesthesia felt that since there was still one port open that would be adequate for surgery.  This am the second port is occluded.  I believe that the patient needs IV access for an emergency procedure given that the baby continues to have intermittent decels. (The status of the baby does not warrant deliver at this time.)  The amount of TPA required to remove the clot is likely very minimal. The risks involved in placement of a second line or central line appear to me to be greater than the risk of TPA,

## 2011-04-07 NOTE — Progress Notes (Signed)
After IVF cut off for 10 min, labs attempted to be drawn from the PICC line.  3cc of blood from the red port and then no more blood return.  Attempted to flush with NS with no success.  Pt positioning arm in multiple positions with no success.  Pt very frustrated and asking for her dr to be informed and that she needs to talk with him ASAP, "I cant take this anymore".

## 2011-04-08 LAB — PROTEIN, URINE, 24 HOUR
Collection Interval-UPROT: 24 hours
Urine Total Volume-UPROT: 1200 mL

## 2011-04-08 NOTE — Progress Notes (Signed)
Pt comfortable, denies LOF, VB, occassional ctx.  +FM Denies CP/SOB, Calf pain. Tolerating diet.  Filed Vitals:   04/08/11 0500 04/08/11 0600 04/08/11 0656 04/08/11 0809  BP:    151/105  Pulse:    100  Temp:    98.7 F (37.1 C)  TempSrc:      Resp: 20 20 20 22   Height:      Weight:      SpO2:       Abd: NT Ext: no CT FHT: 135 mod var, prolonged decel to 5 min noted earlier, with good recovery.  Occassional variable decels. TOCO: no ctx SVE: def  Lab Results  Component Value Date   WBC 11.9* 04/07/2011   HGB 11.5* 04/07/2011   HCT 35.9* 04/07/2011   MCV 74.9* 04/07/2011   PLT 222 04/07/2011    A/Positive/-- (06/27 0000)  A/P 29.4 with severe preeclampsia remote from term MFM following pt for IUGR, next [place] tomorrow, weekly BPP Labs show worsening proteinuria at 1700+g /24hr on 10/21, cont. With wkly labs Ctoco/efm Will discuss delivery plan with team.  Philip Aspen

## 2011-04-08 NOTE — Progress Notes (Signed)
UR chart review completed.  

## 2011-04-09 ENCOUNTER — Other Ambulatory Visit: Payer: Self-pay | Admitting: Obstetrics and Gynecology

## 2011-04-09 ENCOUNTER — Encounter (HOSPITAL_COMMUNITY): Payer: Self-pay | Admitting: *Deleted

## 2011-04-09 ENCOUNTER — Inpatient Hospital Stay (HOSPITAL_COMMUNITY): Payer: Medicaid Other | Admitting: Anesthesiology

## 2011-04-09 ENCOUNTER — Inpatient Hospital Stay (HOSPITAL_COMMUNITY): Payer: Medicaid Other

## 2011-04-09 ENCOUNTER — Encounter (HOSPITAL_COMMUNITY): Payer: Self-pay | Admitting: Anesthesiology

## 2011-04-09 ENCOUNTER — Encounter (HOSPITAL_COMMUNITY)
Admission: AD | Disposition: A | Discharge status: Home / Self Care | Payer: Self-pay | Source: Ambulatory Visit | Attending: Obstetrics and Gynecology

## 2011-04-09 LAB — CBC
HCT: 35.9 % — ABNORMAL LOW (ref 36.0–46.0)
Hemoglobin: 11.3 g/dL — ABNORMAL LOW (ref 12.0–15.0)
Hemoglobin: 10.6 g/dL — ABNORMAL LOW (ref 12.0–15.0)
MCH: 23.8 pg — ABNORMAL LOW (ref 26.0–34.0)
MCHC: 32 g/dL (ref 30.0–36.0)
MCV: 74.3 fL — ABNORMAL LOW (ref 78.0–100.0)
MCV: 74.2 fL — ABNORMAL LOW (ref 78.0–100.0)
RBC: 4.46 MIL/uL (ref 3.87–5.11)
WBC: 11.9 10*3/uL — ABNORMAL HIGH (ref 4.0–10.5)

## 2011-04-09 SURGERY — Surgical Case
Anesthesia: Spinal | Site: Abdomen | Wound class: Clean Contaminated

## 2011-04-09 MED ORDER — BISACODYL 10 MG RE SUPP: 10.0000 mg | Freq: Every day | RECTAL | Status: DC | PRN

## 2011-04-09 MED ORDER — FERROUS SULFATE 325 (65 FE) MG PO TABS
325.0000 mg | ORAL_TABLET | Freq: Two times a day (BID) | ORAL | Status: DC
  Administered 2011-04-10 – 2011-04-13 (×6): 325 mg via ORAL
  Filled 2011-04-09 (×6): qty 1

## 2011-04-09 MED ORDER — NALBUPHINE HCL 10 MG/ML IJ SOLN: 5.0000 mg | INTRAMUSCULAR | Status: DC | PRN

## 2011-04-09 MED ORDER — MEPERIDINE HCL 25 MG/ML IJ SOLN: 6.2500 mg | INTRAMUSCULAR | Status: DC | PRN

## 2011-04-09 MED ORDER — BUPIVACAINE IN DEXTROSE 0.75-8.25 % IT SOLN
INTRATHECAL | Status: DC | PRN
  Administered 2011-04-09: 1.5 mg via INTRATHECAL

## 2011-04-09 MED ORDER — DIPHENHYDRAMINE HCL 50 MG/ML IJ SOLN: 25.0000 mg | INTRAMUSCULAR | Status: DC | PRN

## 2011-04-09 MED ORDER — FENTANYL CITRATE 0.05 MG/ML IJ SOLN
INTRAMUSCULAR | Status: DC | PRN
  Administered 2011-04-09: 100 ug via INTRAVENOUS

## 2011-04-09 MED ORDER — SODIUM CHLORIDE 0.9 % IV SOLN: 1.0000 ug/kg/h | INTRAVENOUS | Status: DC | PRN

## 2011-04-09 MED ORDER — LIDOCAINE HCL (PF) 2 % IJ SOLN: INTRAMUSCULAR | Status: DC | PRN

## 2011-04-09 MED ORDER — EPHEDRINE 5 MG/ML INJ
INTRAVENOUS | Status: AC
  Filled 2011-04-09: qty 10

## 2011-04-09 MED ORDER — ONDANSETRON HCL 4 MG/2ML IJ SOLN
INTRAMUSCULAR | Status: AC
  Filled 2011-04-09: qty 2

## 2011-04-09 MED ORDER — SODIUM CHLORIDE 0.9 % IJ SOLN
10.0000 mL | INTRAMUSCULAR | Status: DC | PRN
  Administered 2011-04-09: 10 mL via INTRAVENOUS
  Filled 2011-04-09: qty 10

## 2011-04-09 MED ORDER — PRENATAL PLUS 27-1 MG PO TABS
1.0000 | ORAL_TABLET | Freq: Every day | ORAL | Status: DC
  Administered 2011-04-10 – 2011-04-13 (×4): 1 via ORAL
  Filled 2011-04-09 (×4): qty 1

## 2011-04-09 MED ORDER — WITCH HAZEL-GLYCERIN EX PADS: 1.0000 "application " | MEDICATED_PAD | CUTANEOUS | Status: DC | PRN

## 2011-04-09 MED ORDER — SODIUM CHLORIDE 0.9 % IJ SOLN
3.0000 mL | INTRAMUSCULAR | Status: DC | PRN
  Administered 2011-04-10: 3 mL via INTRAVENOUS

## 2011-04-09 MED ORDER — MORPHINE SULFATE 0.5 MG/ML IJ SOLN
INTRAMUSCULAR | Status: AC
  Filled 2011-04-09: qty 10

## 2011-04-09 MED ORDER — DIBUCAINE 1 % RE OINT: 1.0000 "application " | TOPICAL_OINTMENT | RECTAL | Status: DC | PRN

## 2011-04-09 MED ORDER — MENTHOL 3 MG MT LOZG: 1.0000 | LOZENGE | OROMUCOSAL | Status: DC | PRN

## 2011-04-09 MED ORDER — CIPROFLOXACIN IN D5W 400 MG/200ML IV SOLN
INTRAVENOUS | Status: DC | PRN
  Administered 2011-04-09: 400 mg via INTRAVENOUS

## 2011-04-09 MED ORDER — BUPIVACAINE IN DEXTROSE 0.75-8.25 % IT SOLN
INTRATHECAL | Status: AC
  Filled 2011-04-09: qty 2

## 2011-04-09 MED ORDER — DIPHENHYDRAMINE HCL 25 MG PO CAPS: 25.0000 mg | ORAL_CAPSULE | ORAL | Status: DC | PRN

## 2011-04-09 MED ORDER — LACTATED RINGERS IV SOLN
INTRAVENOUS | Status: DC
  Administered 2011-04-10: 08:00:00 via INTRAVENOUS

## 2011-04-09 MED ORDER — NALOXONE HCL 0.4 MG/ML IJ SOLN: 0.4000 mg | INTRAMUSCULAR | Status: DC | PRN

## 2011-04-09 MED ORDER — SODIUM BICARBONATE 8.4 % IV SOLN
INTRAVENOUS | Status: DC | PRN
  Administered 2011-04-09: 3 mL via EPIDURAL

## 2011-04-09 MED ORDER — PHENYLEPHRINE 40 MCG/ML (10ML) SYRINGE FOR IV PUSH (FOR BLOOD PRESSURE SUPPORT)
PREFILLED_SYRINGE | INTRAVENOUS | Status: AC
  Filled 2011-04-09: qty 15

## 2011-04-09 MED ORDER — SIMETHICONE 80 MG PO CHEW
80.0000 mg | CHEWABLE_TABLET | Freq: Three times a day (TID) | ORAL | Status: DC
  Administered 2011-04-09 – 2011-04-13 (×12): 80 mg via ORAL

## 2011-04-09 MED ORDER — MEASLES, MUMPS & RUBELLA VAC ~~LOC~~ INJ: 0.5000 mL | INJECTION | Freq: Once | SUBCUTANEOUS | Status: DC

## 2011-04-09 MED ORDER — DIPHENHYDRAMINE HCL 50 MG/ML IJ SOLN: 12.5000 mg | INTRAMUSCULAR | Status: DC | PRN

## 2011-04-09 MED ORDER — OXYTOCIN 10 UNIT/ML IJ SOLN
INTRAMUSCULAR | Status: AC
  Filled 2011-04-09: qty 4

## 2011-04-09 MED ORDER — METRONIDAZOLE IN NACL 5-0.79 MG/ML-% IV SOLN
500.0000 mg | INTRAVENOUS | Status: DC
  Filled 2011-04-09: qty 100

## 2011-04-09 MED ORDER — ONDANSETRON HCL 4 MG PO TABS: 4.0000 mg | ORAL_TABLET | ORAL | Status: DC | PRN

## 2011-04-09 MED ORDER — MAGNESIUM SULFATE 40 G IN LACTATED RINGERS - SIMPLE
2.0000 g/h | INTRAVENOUS | Status: DC
  Administered 2011-04-10: 2 g/h via INTRAVENOUS
  Filled 2011-04-09: qty 500

## 2011-04-09 MED ORDER — IBUPROFEN 600 MG PO TABS
600.0000 mg | ORAL_TABLET | Freq: Four times a day (QID) | ORAL | Status: DC
  Administered 2011-04-10 – 2011-04-13 (×12): 600 mg via ORAL
  Filled 2011-04-09 (×12): qty 1

## 2011-04-09 MED ORDER — EPHEDRINE SULFATE 50 MG/ML IJ SOLN
INTRAMUSCULAR | Status: DC | PRN
  Administered 2011-04-09 (×2): 10 mg via INTRAVENOUS
  Administered 2011-04-09: 5 mg via INTRAVENOUS
  Administered 2011-04-09: 10 mg via INTRAVENOUS
  Administered 2011-04-09: 5 mg via INTRAVENOUS
  Administered 2011-04-09: 10 mg via INTRAVENOUS
  Administered 2011-04-09: 5 mg via INTRAVENOUS
  Administered 2011-04-09: 10 mg via INTRAVENOUS
  Administered 2011-04-09: 5 mg via INTRAVENOUS
  Administered 2011-04-09 (×2): 10 mg via INTRAVENOUS

## 2011-04-09 MED ORDER — CIPROFLOXACIN IN D5W 400 MG/200ML IV SOLN
400.0000 mg | INTRAVENOUS | Status: DC
  Filled 2011-04-09: qty 200

## 2011-04-09 MED ORDER — ONDANSETRON HCL 4 MG/2ML IJ SOLN: 4.0000 mg | Freq: Three times a day (TID) | INTRAMUSCULAR | Status: DC | PRN

## 2011-04-09 MED ORDER — ZOLPIDEM TARTRATE 5 MG PO TABS: 5.0000 mg | ORAL_TABLET | Freq: Every evening | ORAL | Status: DC | PRN

## 2011-04-09 MED ORDER — METRONIDAZOLE IN NACL 5-0.79 MG/ML-% IV SOLN
INTRAVENOUS | Status: DC | PRN
  Administered 2011-04-09: 500 mg via INTRAVENOUS

## 2011-04-09 MED ORDER — MORPHINE SULFATE (PF) 0.5 MG/ML IJ SOLN
INTRAMUSCULAR | Status: DC | PRN
  Administered 2011-04-09: 1 mg via INTRAVENOUS
  Administered 2011-04-09: 4 mg via EPIDURAL

## 2011-04-09 MED ORDER — LANOLIN HYDROUS EX OINT: 1.0000 "application " | TOPICAL_OINTMENT | CUTANEOUS | Status: DC | PRN

## 2011-04-09 MED ORDER — DEXAMETHASONE SODIUM PHOSPHATE 4 MG/ML IJ SOLN: 8.0000 mg | Freq: Once | INTRAMUSCULAR | Status: DC | PRN

## 2011-04-09 MED ORDER — FAMOTIDINE 20 MG PO TABS
20.0000 mg | ORAL_TABLET | Freq: Two times a day (BID) | ORAL | Status: DC
  Administered 2011-04-09 – 2011-04-13 (×7): 20 mg via ORAL
  Filled 2011-04-09 (×7): qty 1

## 2011-04-09 MED ORDER — ONDANSETRON HCL 4 MG/2ML IJ SOLN: 4.0000 mg | INTRAMUSCULAR | Status: DC | PRN

## 2011-04-09 MED ORDER — MAGNESIUM SULFATE 40 MG/ML IJ SOLN: 2.0000 g | Freq: Once | INTRAMUSCULAR | Status: DC

## 2011-04-09 MED ORDER — LACTATED RINGERS IV SOLN
INTRAVENOUS | Status: DC | PRN
  Administered 2011-04-09 (×3): via INTRAVENOUS

## 2011-04-09 MED ORDER — LIDOCAINE-EPINEPHRINE (PF) 2 %-1:200000 IJ SOLN: INTRAMUSCULAR | Status: DC | PRN

## 2011-04-09 MED ORDER — OXYCODONE-ACETAMINOPHEN 5-325 MG PO TABS
1.0000 | ORAL_TABLET | ORAL | Status: DC | PRN
  Administered 2011-04-10 – 2011-04-13 (×16): 1 via ORAL
  Filled 2011-04-09 (×16): qty 1

## 2011-04-09 MED ORDER — NIFEDIPINE ER 30 MG PO TB24
30.0000 mg | ORAL_TABLET | Freq: Every day | ORAL | Status: DC
  Administered 2011-04-09 (×2): 30 mg via ORAL
  Filled 2011-04-09 (×3): qty 1

## 2011-04-09 MED ORDER — FENTANYL CITRATE 0.05 MG/ML IJ SOLN
INTRAMUSCULAR | Status: AC
  Filled 2011-04-09: qty 2

## 2011-04-09 MED ORDER — MAGNESIUM SULFATE 40 G IN LACTATED RINGERS - SIMPLE
2.0000 g/h | INTRAVENOUS | Status: DC
  Administered 2011-04-09: 2 g/h via INTRAVENOUS
  Filled 2011-04-09: qty 500

## 2011-04-09 MED ORDER — PHENYLEPHRINE HCL 10 MG/ML IJ SOLN
INTRAMUSCULAR | Status: DC | PRN
  Administered 2011-04-09 (×2): 80 ug via INTRAVENOUS
  Administered 2011-04-09: 40 ug via INTRAVENOUS
  Administered 2011-04-09 (×2): 80 ug via INTRAVENOUS
  Administered 2011-04-09: 40 ug via INTRAVENOUS
  Administered 2011-04-09: 80 ug via INTRAVENOUS
  Administered 2011-04-09: 40 ug via INTRAVENOUS
  Administered 2011-04-09 (×2): 80 ug via INTRAVENOUS
  Administered 2011-04-09: 40 ug via INTRAVENOUS
  Administered 2011-04-09: 80 ug via INTRAVENOUS
  Administered 2011-04-09: 40 ug via INTRAVENOUS
  Administered 2011-04-09: 80 ug via INTRAVENOUS

## 2011-04-09 MED ORDER — CARBOPROST TROMETHAMINE 250 MCG/ML IM SOLN: 250.0000 ug | Freq: Once | INTRAMUSCULAR | Status: AC | PRN

## 2011-04-09 MED ORDER — OXYTOCIN 20 UNITS IN LACTATED RINGERS INFUSION - SIMPLE
125.0000 mL/h | INTRAVENOUS | Status: AC
  Administered 2011-04-09: 125 mL/h via INTRAVENOUS
  Filled 2011-04-09 (×2): qty 1000

## 2011-04-09 MED ORDER — ONDANSETRON HCL 4 MG/2ML IJ SOLN
INTRAMUSCULAR | Status: DC | PRN
  Administered 2011-04-09: 4 mg via INTRAVENOUS

## 2011-04-09 MED ORDER — FLEET ENEMA 7-19 GM/118ML RE ENEM: 1.0000 | ENEMA | RECTAL | Status: DC | PRN

## 2011-04-09 MED ORDER — SCOPOLAMINE 1 MG/3DAYS TD PT72: 1.0000 | MEDICATED_PATCH | Freq: Once | TRANSDERMAL | Status: DC

## 2011-04-09 MED ORDER — OXYTOCIN 20 UNITS IN LACTATED RINGERS INFUSION - SIMPLE
INTRAVENOUS | Status: DC | PRN
  Administered 2011-04-09: 40 [IU] via INTRAVENOUS

## 2011-04-09 MED ORDER — DIPHENHYDRAMINE HCL 25 MG PO CAPS: 25.0000 mg | ORAL_CAPSULE | Freq: Four times a day (QID) | ORAL | Status: DC | PRN

## 2011-04-09 MED ORDER — SENNOSIDES-DOCUSATE SODIUM 8.6-50 MG PO TABS
2.0000 | ORAL_TABLET | Freq: Every day | ORAL | Status: DC
  Administered 2011-04-09 – 2011-04-12 (×4): 2 via ORAL

## 2011-04-09 MED ORDER — TETANUS-DIPHTH-ACELL PERTUSSIS 5-2.5-18.5 LF-MCG/0.5 IM SUSP
0.5000 mL | Freq: Once | INTRAMUSCULAR | Status: AC
  Administered 2011-04-10: 0.5 mL via INTRAMUSCULAR
  Filled 2011-04-09: qty 0.5

## 2011-04-09 MED ORDER — SIMETHICONE 80 MG PO CHEW: 80.0000 mg | CHEWABLE_TABLET | ORAL | Status: DC | PRN

## 2011-04-09 SURGICAL SUPPLY — 32 items
CHLORAPREP W/TINT 26ML (MISCELLANEOUS) ×2 IMPLANT
CLOTH BEACON ORANGE TIMEOUT ST (SAFETY) ×2 IMPLANT
CONTAINER PREFILL 10% NBF 15ML (MISCELLANEOUS) IMPLANT
DRESSING TELFA 8X3 (GAUZE/BANDAGES/DRESSINGS) ×1 IMPLANT
ELECT REM PT RETURN 9FT ADLT (ELECTROSURGICAL) ×2
ELECTRODE REM PT RTRN 9FT ADLT (ELECTROSURGICAL) ×1 IMPLANT
EXTRACTOR VACUUM BELL STYLE (SUCTIONS) IMPLANT
GLOVE BIO SURGEON STRL SZ7 (GLOVE) ×4 IMPLANT
GOWN PREVENTION PLUS LG XLONG (DISPOSABLE) ×6 IMPLANT
KIT ABG SYR 3ML LUER SLIP (SYRINGE) IMPLANT
NDL HYPO 25X5/8 SAFETYGLIDE (NEEDLE) ×1 IMPLANT
NEEDLE HYPO 25X5/8 SAFETYGLIDE (NEEDLE) IMPLANT
NS IRRIG 1000ML POUR BTL (IV SOLUTION) ×2 IMPLANT
PACK C SECTION WH (CUSTOM PROCEDURE TRAY) ×2 IMPLANT
PAD ABD 7.5X8 STRL (GAUZE/BANDAGES/DRESSINGS) ×1 IMPLANT
RETRACTOR WND ALEXIS 25 LRG (MISCELLANEOUS) ×1 IMPLANT
RTRCTR WOUND ALEXIS 25CM LRG (MISCELLANEOUS) ×2
SLEEVE SCD COMPRESS KNEE MED (MISCELLANEOUS) IMPLANT
SPONGE GAUZE 4X4 12PLY (GAUZE/BANDAGES/DRESSINGS) ×1 IMPLANT
STAPLER VISISTAT 35W (STAPLE) IMPLANT
SUT MNCRL 0 VIOLET CTX 36 (SUTURE) ×2 IMPLANT
SUT MONOCRYL 0 CTX 36 (SUTURE) ×2
SUT PDS AB 0 CTX 60 (SUTURE) IMPLANT
SUT PLAIN 2 0 XLH (SUTURE) IMPLANT
SUT VIC AB 0 CT1 27 (SUTURE) ×4
SUT VIC AB 0 CT1 27XBRD ANBCTR (SUTURE) ×2 IMPLANT
SUT VIC AB 2-0 CT1 27 (SUTURE) ×2
SUT VIC AB 2-0 CT1 TAPERPNT 27 (SUTURE) ×1 IMPLANT
TAPE CLOTH SURG 4X10 WHT LF (GAUZE/BANDAGES/DRESSINGS) ×1 IMPLANT
TOWEL OR 17X24 6PK STRL BLUE (TOWEL DISPOSABLE) ×4 IMPLANT
TRAY FOLEY CATH 14FR (SET/KITS/TRAYS/PACK) ×2 IMPLANT
WATER STERILE IRR 1000ML POUR (IV SOLUTION) ×1 IMPLANT

## 2011-04-09 NOTE — Transfer of Care (Signed)
Immediate Anesthesia Transfer of Care Note  Patient: Kimberly [name]  Procedure(s) Performed:  CESAREAN SECTION  Patient Location: PACU  Anesthesia Type: Spinal and Epidural  Level of Consciousness: awake, alert  and oriented  Airway & Oxygen Therapy: Patient Spontanous Breathing  Post-op Assessment: Report given to PACU RN and Post -op Vital signs reviewed and stable  Post vital signs: Reviewed and stable  Complications: No apparent anesthesia complications

## 2011-04-09 NOTE — Progress Notes (Signed)
Addendum  Will start Magnesium sulfate.   D/w pt that I will do my best to achieve a low transverse cesarean but there is a risk of classical or T incision.    Tamila Gaulin A

## 2011-04-09 NOTE — Brief Op Note (Addendum)
03/26/2011 - 04/09/2011  4:48 PM  PATIENT:  Kimberly [name]  20 y.o. female  PRE-OPERATIVE DIAGNOSIS:  Breech, 29 weeks IUGR, severe preeclampsia  POST-OPERATIVE DIAGNOSIS:  same  PROCEDURE:  Procedure(s): CESAREAN SECTION  SURGEON:  Surgeon(s): Loney Laurence  ASSISTANTS: Dr. Claiborne Billings   ANESTHESIA:   spinal/ epidural  EBL:  Total I/O In: 690.8 [I.V.:690.8] Out: 1075 [Urine:475; Blood:600]  SPECIMEN:  Source of Specimen:  placenta  DISPOSITION OF SPECIMEN:  PATHOLOGY  COUNTS:  YES  DICTATION:  See below  PLAN OF CARE: Admit to inpatient   PATIENT DISPOSITION:  PACU - hemodynamically stable.   Delay start of Pharmacological VTE agent (>24hrs) due to surgical blood loss or risk of bleeding:  yes  Complications:  none Medications:  Ancef, Pitocin Findings:  Baby female, Apgars 3,7, weight 860 gm.   Normal tubes, ovaries and uterus seen.  Technique:  After adequate CSE anesthesia was achieved, the patient was prepped and draped in usual sterile fashion.  A foley catheter was used to drain the bladder.  A pfannanstiel incision was made with the scalpel and carried down to the fascia with the bovie cautery. The fascia was incised in the midline with the scalpel and carried in a transverse curvilinear manner bilaterally.  The fascia was reflected superiorly and inferiorly off the rectus muscles and the muscles split in the midline.  A bowel free portion of the peritoneum was entered bluntly and then extended in a superior and inferior manner with good visualization of the bowel and bladder.  The Alexis instrument was then placed and the vesico-uterine fascia tented up and incised in a transverse curvilinear manner.  A 2 cm incision was made in the upper portion of the lower uterine segment until the amnion was exposed.  Clear fluid was noted and the baby delivered in the breech presentation without complication.  The baby was bulb suctioned and the cord was clamped and cut.   The baby was then handed to awaiting Neonatology.  The placenta was then delivered manually and the uterus cleared of all debris.  The uterine incision was then closed with a running lock stitch of 0 monocryl.  An imbricating layer of 0 monocryl was closed as well. Good hemostasis of the uterine incision was achieved, the abdomen was cleared with irrigation and the peritoneum was closed with a running stitch of 2-0 vicryl.  This incorporated the rectus muscles as a separate layer.  The fascia was then closed with a running stitch of 0 vicryl.  The subcutaneous layer was closed with interrupted  stitches of 2-0 plain gut.  The skin was closed with staples.  The patient tolerated the procedure well and was returned to the recovery room in stable condition.  All counts were correct times three.  Kimberly [name] A

## 2011-04-09 NOTE — Progress Notes (Signed)
Patient seen for ultrasound appointment today.  There has been less than 100 g of fetal growth in the last two weeks, resulting in now severe IUGR.  Given this and the patient's worsening BP there is high risk of maternal morbidity and IUFD with continuing pregnancy.  Thus, I recommend delivery at this time.  Please see AS-OBGYN report for full details.

## 2011-04-09 NOTE — Progress Notes (Signed)
Patient seen for ultrasound appointment today.  Please see [facility] report for details.  

## 2011-04-09 NOTE — Progress Notes (Addendum)
Pt rec'd from PACU via stretcher.  G4P1AB3, [redacted] week gestation.   PCS 04/09/2011 - delivered female infant at 1620.  Baby in NICU.   Epidural/spinal anesthesia with Duramorph.   ML LR with pitocin inf at 184ml/hr, Magnesium Sulfate inf at 2gms/77ml/hr.  RT brachial PICC line in place (inserted 10/19).   Foley catheter patent and to BSD, clear urine noted on admission.  Epidural catheter remains in - orders to leave in overnight, repeat CBC in am.

## 2011-04-09 NOTE — Anesthesia Postprocedure Evaluation (Signed)
Anesthesia Post Note  Patient: Kimberly [name]  Procedure(s) Performed:  CESAREAN SECTION  Anesthesia type: Spinal  Patient location: PACU  Post pain: Pain level controlled  Post assessment: Post-op Vital signs reviewed  Last Vitals:  Filed Vitals:   04/09/11 1730  BP: 126/53  Pulse: 115  Temp:   Resp: 21    Post vital signs: Reviewed  Level of consciousness: awake  Complications: No apparent anesthesia complications epidural catheter is left in for pain control and possible return to OR. Plt count today stable at 140 and 146. Order has been placed to obtain CBC prior to epidural catheter removal. NSAIDS are being withheld

## 2011-04-09 NOTE — Progress Notes (Signed)
Pt up to bathroom.

## 2011-04-09 NOTE — Anesthesia Preprocedure Evaluation (Signed)
Anesthesia Evaluation  Patient identified by MRN, date of birth, ID band Patient awake  General Assessment Comment  Reviewed: Allergy & Precautions, H&P , NPO status , Patient's Chart, lab work & pertinent test results  Airway Mallampati: II TM Distance: >3 FB Neck ROM: full    Dental No notable dental hx.    Pulmonary    Pulmonary exam normal       Cardiovascular hypertension,     Neuro/Psych Negative Neurological ROS  Negative Psych ROS   GI/Hepatic negative GI ROS Neg liver ROS    Endo/Other  Negative Endocrine ROS  Renal/GU negative Renal ROS  Genitourinary negative   Musculoskeletal negative musculoskeletal ROS (+)   Abdominal (+) obese,   Peds negative pediatric ROS (+)  Hematology negative hematology ROS (+)   Anesthesia Other Findings   Reproductive/Obstetrics (+) Pregnancy                           Anesthesia Physical Anesthesia Plan  ASA: III  Anesthesia Plan: Combined Spinal and Epidural   Post-op Pain Management:    Induction:   Airway Management Planned:   Additional Equipment:   Intra-op Plan:   Post-operative Plan:   Informed Consent: I have reviewed the patients History and Physical, chart, labs and discussed the procedure including the risks, benefits and alternatives for the proposed anesthesia with the patient or authorized representative who has indicated his/her understanding and acceptance.     Plan Discussed with: CRNA  Anesthesia Plan Comments:         Anesthesia Quick Evaluation

## 2011-04-09 NOTE — Op Note (Addendum)
03/26/2011 - 04/09/2011  4:48 PM  PATIENT:  Kimberly [name]  20 y.o. female  PRE-OPERATIVE DIAGNOSIS:  Breech, 29 weeks IUGR, severe preeclampsia  POST-OPERATIVE DIAGNOSIS:  same  PROCEDURE:  Procedure(s): CESAREAN SECTION  SURGEON:  Surgeon(s): Marieta Markov A Bell Cai  ASSISTANTS: Dr. Callahan   ANESTHESIA:   spinal/ epidural  EBL:  Total I/O In: 690.8 [I.V.:690.8] Out: 1075 [Urine:475; Blood:600]  SPECIMEN:  Source of Specimen:  placenta  DISPOSITION OF SPECIMEN:  PATHOLOGY  COUNTS:  YES  DICTATION:  See below  PLAN OF CARE: Admit to inpatient   PATIENT DISPOSITION:  PACU - hemodynamically stable.   Delay start of Pharmacological VTE agent (>24hrs) due to surgical blood loss or risk of bleeding:  yes  Complications:  none Medications:  Ancef, Pitocin Findings:  Baby female, Apgars 3,7, weight 860 gm.   Normal tubes, ovaries and uterus seen.  Technique:  After adequate CSE anesthesia was achieved, the patient was prepped and draped in usual sterile fashion.  A foley catheter was used to drain the bladder.  A pfannanstiel incision was made with the scalpel and carried down to the fascia with the bovie cautery. The fascia was incised in the midline with the scalpel and carried in a transverse curvilinear manner bilaterally.  The fascia was reflected superiorly and inferiorly off the rectus muscles and the muscles split in the midline.  A bowel free portion of the peritoneum was entered bluntly and then extended in a superior and inferior manner with good visualization of the bowel and bladder.  The Alexis instrument was then placed and the vesico-uterine fascia tented up and incised in a transverse curvilinear manner.  A 2 cm incision was made in the upper portion of the lower uterine segment until the amnion was exposed.  Clear fluid was noted and the baby delivered in the breech presentation without complication.  The baby was bulb suctioned and the cord was clamped and cut.   The baby was then handed to awaiting Neonatology.  The placenta was then delivered manually and the uterus cleared of all debris.  The uterine incision was then closed with a running lock stitch of 0 monocryl.  An imbricating layer of 0 monocryl was closed as well. Good hemostasis of the uterine incision was achieved, the abdomen was cleared with irrigation and the peritoneum was closed with a running stitch of 2-0 vicryl.  This incorporated the rectus muscles as a separate layer.  The fascia was then closed with a running stitch of 0 vicryl.  The subcutaneous layer was closed with interrupted  stitches of 2-0 plain gut.  The skin was closed with staples.  The patient tolerated the procedure well and was returned to the recovery room in stable condition.  All counts were correct times three.  [name]               

## 2011-04-09 NOTE — Progress Notes (Signed)
20 y.o. [id number] [redacted]w[redacted]d HD#14 admitted for preeclampsia.  Pt currently stable with no c/o HA.  Good FM.  Filed Vitals:   04/09/11 0717  BP: 154/77  Pulse: 114  Temp: 98 F (36.7 C)  Resp: 22   MFM U/S:  EFW 826 gm, AFI 10, Br.  Less than 100 gm growth since last U/S, <10%ile. Dopplers not absent or reversed.  BPP 8/8.  Lungs CTA Cor RRR Abd  Soft, gravid, nontender Ex SCDs FHTs  130s, good short term variability, NST R Toco  occ  Lab Results  Component Value Date   WBC 11.9* 04/07/2011   HGB 11.5* 04/07/2011   HCT 35.9* 04/07/2011   MCV 74.9* 04/07/2011   PLT 222 04/07/2011     A/P:  HD#14  104w5d with severe preeclampsia and poor fetal growth.  Per MFM, the benefits to the fetus of continued pregnancy are far out weighed by the risks of preeclampsia and IUGR.  Pt ate a rice crispie treat at 7- will do the C/S for breech, severe preeclampsia and IUGR at 3 pm today unless nonreassuring FHTs.  All risks, benefits and alternatives d/w pt and she understands that the baby's chances of survival are now better being delivered rather than continuing gestation.    Jerline Linzy A

## 2011-04-09 NOTE — Consult Note (Signed)
Neonatology Consult to Antenatal Patient:  Kimberly [name] is being seen today at 29 5/[redacted] weeks GA due to severe pre-eclampsia and poor fetal growth over the past month. She is currently not having active labor. She is scheduled for a c/s this afternoon, breech presentation. She received BMZ 2 doses on 10/9-10 and is on magnesium sulfate now.  I spoke with the patient alone. We discussed what she could expect at delivery, including usual DR management, possible respiratory complications and need for support, IV access, feedings (mother desires breast feeding, which was encouraged), LOS, Mortality and Morbidity, and long term outcomes. I answered her questions.  Thank you for asking me to see this patient.  Deatra James, MD Neonatologist  Time spent: [phone]

## 2011-04-09 NOTE — Anesthesia Procedure Notes (Addendum)
Spinal Block  Patient location during procedure: OR Start time: 04/09/2011 3:40 PM End time: 04/09/2011 3:53 PM Staffing Anesthesiologist: Sandrea Hughs Performed by: anesthesiologist  Preanesthetic Checklist Completed: patient identified, site marked, surgical consent, pre-op evaluation, timeout performed, IV checked, risks and benefits discussed and monitors and equipment checked Spinal Block Patient position: sitting Prep: DuraPrep Patient monitoring: cardiac monitor, continuous pulse ox, blood pressure and heart rate Approach: midline Location: L3-4 Injection technique: catheter Needle Needle type: Tuohy and Sprotte  Needle gauge: 24 G Needle length: 12.7 cm Needle insertion depth: 9 cm Catheter type: closed end flexible Catheter size: 19 g Catheter at skin depth: 14 cm Assessment Sensory level: T4

## 2011-04-10 ENCOUNTER — Encounter (HOSPITAL_COMMUNITY): Payer: Self-pay | Admitting: Obstetrics and Gynecology

## 2011-04-10 LAB — CBC
HCT: 30.7 % — ABNORMAL LOW (ref 36.0–46.0)
Hemoglobin: 9.6 g/dL — ABNORMAL LOW (ref 12.0–15.0)
MCHC: 31.3 g/dL (ref 30.0–36.0)
RDW: 15 % (ref 11.5–15.5)
WBC: 12.6 10*3/uL — ABNORMAL HIGH (ref 4.0–10.5)

## 2011-04-10 LAB — MRSA PCR SCREENING: MRSA by PCR: NEGATIVE

## 2011-04-10 LAB — RPR: RPR Ser Ql: NONREACTIVE

## 2011-04-10 LAB — FACTOR 5 LEIDEN

## 2011-04-10 NOTE — Anesthesia Postprocedure Evaluation (Signed)
  Anesthesia Post-op Note  Patient: Kimberly [name]  Procedure(s) Performed:  CESAREAN SECTION  Patient Location: Women's Unit  Anesthesia Type: General  Level of Consciousness: awake  Airway and Oxygen Therapy: Patient Spontanous Breathing  Post-op Pain: none  Post-op Assessment: Post-op Vital signs reviewed  Post-op Vital Signs: Reviewed and stable  Complications: No apparent anesthesia complications

## 2011-04-10 NOTE — Addendum Note (Signed)
Addendum  created 04/10/11 0813 by Cephus Shelling   Modules edited:Anesthesia Blocks and Procedures, Anesthesia LDA, Inpatient Notes

## 2011-04-10 NOTE — Addendum Note (Signed)
Addendum  created 04/10/11 0945 by Cephus Shelling   Modules edited:Notes Section

## 2011-04-10 NOTE — Progress Notes (Signed)
UR chart review completed.  

## 2011-04-10 NOTE — Progress Notes (Signed)
Received report from AICU nurse before transfer to mother baby and nurse stated that ibuprofen had been withheld due to anesthesia order to hold NSAIDS.  Epidural has been removed and platelet count 143.  Notified Dr. Arlyce Dice to verify if ibuprofen could be given now.  Dr. Arlyce Dice stated that ibuprofen could be given to patient now.  Will continue to monitor pt closely.  Earl Gala, Linda Hedges [place]

## 2011-04-10 NOTE — Progress Notes (Signed)
Post Op Day 1 Subjective: no complaints and no headaches  Objective: Blood pressure 126/72, pulse 73, temperature 98 F (36.7 C), temperature source Oral, resp. rate 12, height 5\' 7"  (1.702 m), weight 163.295 kg (360 lb), SpO2 100.00%, unknown if currently breastfeeding.  Physical Exam:  General: alert Lochia: appropriate Uterine Fundus: firm Incision: no significant drainage   Basename 04/10/11 0600 04/09/11 1450  HGB 9.6* 10.6*  HCT 30.7* 33.1*    Assessment/Plan: Mild preeclampsia s/p cesarean delivery of preterm breech with severe FGR.  Baby doing very well in NICU (room air, no vent).  Patient doing well with no significant hypertension and good urine output.  Will stop magnesium prophylaxis (18 hours post delivery) and observe.  Transfer out of AICU in 2 hours if she continues to be stable.   LOS: 15 days   Rebbie Lauricella D 04/10/2011, 9:35 AM

## 2011-04-11 LAB — ANTIPHOSPHOLIPID SYNDROME EVAL, BLD
Anticardiolipin IgA: 7 APL U/mL — ABNORMAL LOW (ref <22)
Anticardiolipin IgG: 2 GPL U/mL — ABNORMAL LOW (ref <23)
PTT Lupus Anticoagulant: 33.1 secs (ref 28.0–43.0)
Phosphatydalserine, IgA: 1 U/mL (ref <20)
Phosphatydalserine, IgG: 0 U/mL (ref <16)
Phosphatydalserine, IgM: 1 U/mL (ref <22)

## 2011-04-11 NOTE — Progress Notes (Signed)
PSYCHOSOCIAL ASSESSMENT ~ MATERNAL/CHILD Name: Kimberly [name]                                                                                  Age: 22 days   Referral Date: 04/11/11   Reason/Source: NICU Support/NICU  I. FAMILY/HOME ENVIRONMENT A. Child's Legal Guardian _x__Parent(s) ___Grandparent ___Foster parent ___DSS_________________ Name: Kimberly [name]                                     DOB: March 10, 1991                     Age: 20  Address: [address] [place], [place] [address]  Name: Kimberly [name]                                DOB: [birth year]                    Age: 50  Address: [place]-does not live with MOB  B. Other Household Members/Support Persons Name:                                         Relationship: MGM               DOB ___/___/___                   Name:                                         Relationship: MGF               DOB ___/___/___                   Name:                                         Relationship:                        DOB ___/___/___                   Name:                                         Relationship:                        DOB ___/___/___  C. Other Support: Good support system   II. PSYCHOSOCIAL DATA A. Information Source  _x_Patient Interview  __Family Interview           __Other:   [employer] and [employer] __Employment: MOB receives unemployment at this time. _x_Medicaid    [place]: [place]                __Private Insurance:                   __Self Pay  __Food Stamps   _x_WIC __[employer]     __Public Housing     __Section 8    __Maternity Care Coordination/Child Service Coordination/Early Intervention  _x_School: FOB attends [employer] for his GED                                                   Grade:  __Other:   Priscille Kluver and Environment Information Cultural Issues Impacting Care: none  known  III. STRENGTHS _x__Supportive family/friends _x__Adequate Resources _x__Compliance with medical plan ___Home prepared for Child (including basic supplies) _x__Understanding of illness      _x__Other: Pediatrician will be Dr. Donnie Coffin IV. RISK FACTORS AND CURRENT PROBLEMS         __x__No Problems Noted                                                                                                                                                                                                                                       Pt              Family     Substance Abuse                                                                ___              ___        Mental Illness  ___              ___  Family/Relationship Issues                                      ___               ___             Abuse/Neglect/Domestic Violence                                         ___         ___  Financial Resources                                        ___              ___             Transportation                                                                        ___               ___  DSS Involvement                                                                   ___              ___  Adjustment to Illness                                                               ___              ___  Knowledge/Cognitive Deficit                                                   ___              ___             Compliance with Treatment                                                 ___                ___  Basic Needs (food, housing, etc.)                                          ___              ___             Housing Concerns                                       ___              ___ Other_____________________________________________________________            V. SOCIAL WORK ASSESSMENT SW received call from baby's bedside RN stating that  MOB would like to speak to SW.  SW met with her and accompanied her to her 1st floor room to complete assessment.  MOB was extremely pleasant and seemed to enjoy speaking with SW.  She states she lives with her parents, who are supportive and happy about the baby, and that FOB is involved and supportive.  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  She was here on bed rest for the past few weeks and seems to be coping with her hospitalization and baby's premature birth well.  She seems scared of the baby, in regards to holding her, so we talked about this and SW validated feelings.  SW thinks that she will need some time to adjust, but does not foresee and problem with bonding.  SW assured her that the nurses would be close by when she holds baby for the first time and that she will get more comfortable the more she does it.  She seems very excited about baby and states she had a lot of time to research preemies while she was in the hospital.  She expresses that she thinks she has prepared herself well.  SW discussed emotions related to the NICU and made her aware of the ups and downs.  She seemed appreciative.  SW also informed her of support services offered by NICU SWs and gave contact information.  She states she has a car, but is worried about getting in and out of it until she heals from her c-section.  SW informed her that her doctor will probably tell her that she cannot drive for two weeks.  She states FOB goes to school during the day and will not be able to bring her to the hospital as much as she will want to be here.  SW offered bus passes and she accepted gladly.  SW only had one 31 day pass left, and asked her to ask SW in a few days for one for FOB.  She states they do not have everything they need for baby at this time, but are in the  process of getting supplies.  Her baby love worker and pregnancy care coordinator have helped her a lot and she listed these people as good supports.  She has a pack n play and a newborn car seat, but does not have a preemie seat at this time.  SW advised that she put money  aside and wait until closer to d/c to get a preemie car seat.  SW informed MOB of baby's eligibility for SSI and assisted her in completing application.  VI. SOCIAL WORK PLAN  ___No Further Intervention Required/No Barriers to Discharge   _x__Psychosocial Support and Ongoing Assessment of Needs   ___Patient/Family Education:   ___Child Protective Services Report   County___________ Date___/____/____   ___Information/Referral to [employer]_________________________   _x__Other: SSI

## 2011-04-11 NOTE — Plan of Care (Signed)
Problem: Discharge Progression Outcomes Goal: Barriers To Progression Addressed/Resolved Outcome: Progressing S/P Mag infusion & AICU- transferred to [facility] 04/10/11 @ 1600, Mag off 10/24 @ 0930

## 2011-04-11 NOTE — Progress Notes (Signed)
Stable.  Normotensive!  Baby in NICU.  Incision clean and dry and fundus firm and pad dry.  No Fever.  Comfortable.  Pumping.  PIC line still in and will remain in until D/C.

## 2011-04-12 MED ORDER — SODIUM CHLORIDE 0.9 % IJ SOLN: 10.0000 mL | Freq: Two times a day (BID) | INTRAMUSCULAR | Status: DC

## 2011-04-12 MED ORDER — SODIUM CHLORIDE 0.9 % IJ SOLN
10.0000 mL | Freq: Two times a day (BID) | INTRAMUSCULAR | Status: DC
  Filled 2011-04-12 (×3): qty 10

## 2011-04-12 MED ORDER — SODIUM CHLORIDE 0.9 % IJ SOLN
10.0000 mL | INTRAMUSCULAR | Status: DC | PRN
  Filled 2011-04-12: qty 10

## 2011-04-12 NOTE — Discharge Summary (Signed)
Obstetric Discharge Summary Reason for Admission: Gestational Hypertension, IUGR, Elevated umbilical artery dopplers Prenatal Procedures: NST, Preeclampsia and ultrasound Intrapartum Procedures: cesarean: low cervical, transverse Postpartum Procedures: none Complications-Operative and Postpartum: none HGB  Date Value Range Status  02/06/2009 10.8* 11.6-15.9 (g/dL) Final     Hemoglobin  Date Value Range Status  04/10/2011 9.6* 12.0-15.0 (g/dL) Final     HCT  Date Value Range Status  04/10/2011 30.7* 36.0-46.0 (%) Final  02/06/2009 33.2* 34.8-46.6 (%) Final    Discharge Diagnoses: Preterm delivery, gestational hypertension, IUGR, abnormal UA dopplers  Discharge Information: Date: 04/12/2011 Activity: pelvic rest Diet: routine Medications: Ibuprofen and Percocet Condition: improved Instructions: refer to practice specific booklet Discharge to: home Follow-up Information    Follow up with HORVATH,MICHELLE A in 1 week. (For a blood pressure check)    Contact information:   [address] [place] [address] [phone]          Newborn Data: Live born female  Birth Weight: 1 lb 14.3 oz (860 g) APGAR: 3, 7  Home with NICU.  Shani Fitch,Carlyne H. 04/12/2011, 10:53 PM

## 2011-04-12 NOTE — Progress Notes (Signed)
Post Op Day 3 Subjective: no complaints, up ad lib, voiding, tolerating PO, + flatus and pain controlled Pumping well. Baby in NICU. Pt has been in NICU most of the day.  Unable to evaluate until late afternoon  Objective: Filed Vitals:   04/11/11 2156 04/12/11 0520 04/12/11 1431 04/12/11 2239  BP: 134/77 123/80 122/78 140/89  Pulse: 101 99 92 97  Temp: 97.7 F (36.5 C) 97.9 F (36.6 C) 98 F (36.7 C) 97.8 F (36.6 C)  TempSrc: Oral Oral Oral Oral  Resp: 20 18 20 16   Height:      Weight:      SpO2:    100%    Physical Exam:  General: alert, cooperative, appears stated age and no distress Lochia: appropriate Uterine Fundus: firm Incision: healing well DVT Evaluation: No evidence of DVT seen on physical exam.   Basename 04/10/11 0600  HGB 9.6*  HCT 30.7*    Assessment/Plan: Plan for discharge tomorrow   LOS: 17 days   Argelio Granier,Charliee H. 04/12/2011, 10:49 PM

## 2011-04-13 MED ORDER — IBUPROFEN 600 MG PO TABS: 600.0000 mg | ORAL_TABLET | Freq: Four times a day (QID) | ORAL | Status: AC | PRN

## 2011-04-13 MED ORDER — OXYCODONE-ACETAMINOPHEN 5-325 MG PO TABS: 2.0000 | ORAL_TABLET | ORAL | Status: AC | PRN

## 2011-04-13 NOTE — Progress Notes (Signed)
Pt. Has had progressive incisional pain through the night after hurrying down to hospital entrance from NICU for a pizza delivery.  Reinforced with the patient to take her time, and that any activities that increase pain, fatigue, or bleeding need to be avoided.

## 2011-04-13 NOTE — Progress Notes (Signed)
PICC Line removed by Raynelle Fanning iv team nurse.

## 2011-08-26 ENCOUNTER — Emergency Department (HOSPITAL_BASED_OUTPATIENT_CLINIC_OR_DEPARTMENT_OTHER)
Admission: EM | Admit: 2011-08-26 | Discharge: 2011-08-27 | Disposition: A | Discharge status: Home / Self Care | Payer: Medicaid Other | Attending: Emergency Medicine | Admitting: Emergency Medicine

## 2011-08-26 ENCOUNTER — Encounter (HOSPITAL_BASED_OUTPATIENT_CLINIC_OR_DEPARTMENT_OTHER): Payer: Self-pay

## 2011-08-26 DIAGNOSIS — J069 Acute upper respiratory infection, unspecified: Secondary | ICD-10-CM

## 2011-08-26 DIAGNOSIS — H9209 Otalgia, unspecified ear: Secondary | ICD-10-CM | POA: Insufficient documentation

## 2011-08-26 DIAGNOSIS — J029 Acute pharyngitis, unspecified: Secondary | ICD-10-CM | POA: Insufficient documentation

## 2011-08-26 NOTE — ED Notes (Signed)
Sore throat and right ear ache x 2 days

## 2011-08-26 NOTE — ED Notes (Signed)
No wounds present during this visit

## 2011-08-27 MED ORDER — ALBUTEROL SULFATE HFA 108 (90 BASE) MCG/ACT IN AERS
2.0000 | INHALATION_SPRAY | RESPIRATORY_TRACT | Status: DC | PRN
  Administered 2011-08-27: 2 via RESPIRATORY_TRACT
  Filled 2011-08-27: qty 6.7

## 2011-08-27 MED ORDER — ANTIPYRINE-BENZOCAINE 5.4-1.4 % OT SOLN
3.0000 [drp] | Freq: Once | OTIC | Status: AC
  Administered 2011-08-27: 3 [drp] via OTIC
  Filled 2011-08-27: qty 10

## 2011-08-27 MED ORDER — IBUPROFEN 800 MG PO TABS
800.0000 mg | ORAL_TABLET | Freq: Once | ORAL | Status: AC
  Administered 2011-08-27: 800 mg via ORAL
  Filled 2011-08-27: qty 1

## 2011-08-27 MED ORDER — IBUPROFEN 800 MG PO TABS: 800.0000 mg | ORAL_TABLET | Freq: Three times a day (TID) | ORAL | Status: AC

## 2011-08-27 NOTE — ED Notes (Signed)
MD at bedside. 

## 2011-08-27 NOTE — Discharge Instructions (Signed)
Antibiotic Nonuse  Your caregiver felt that the infection or problem was not one that would be helped with an antibiotic. Infections may be caused by viruses or bacteria. Only a caregiver can tell which one of these is the likely cause of an illness. A cold is the most common cause of infection in both adults and children. A cold is a virus. Antibiotic treatment will have no effect on a viral infection. Viruses can lead to many lost days of work caring for sick children and many missed days of school. Children may catch as many as 10 "colds" or "flus" per year during which they can be tearful, cranky, and uncomfortable. The goal of treating a virus is aimed at keeping the ill person comfortable. Antibiotics are medications used to help the body fight bacterial infections. There are relatively few types of bacteria that cause infections but there are hundreds of viruses. While both viruses and bacteria cause infection they are very different types of germs. A viral infection will typically go away by itself within 7 to 10 days. Bacterial infections may spread or get worse without antibiotic treatment. Examples of bacterial infections are:  Sore throats (like strep throat or tonsillitis).   Infection in the lung (pneumonia).   Ear and skin infections.  Examples of viral infections are:  Colds or flus.   Most coughs and bronchitis.   Sore throats not caused by Strep.   Runny noses.  It is often best not to take an antibiotic when a viral infection is the cause of the problem. Antibiotics can kill off the helpful bacteria that we have inside our body and allow harmful bacteria to start growing. Antibiotics can cause side effects such as allergies, nausea, and diarrhea without helping to improve the symptoms of the viral infection. Additionally, repeated uses of antibiotics can cause bacteria inside of our body to become resistant. That resistance can be passed onto harmful bacterial. The next time  you have an infection it may be harder to treat if antibiotics are used when they are not needed. Not treating with antibiotics allows our own immune system to develop and take care of infections more efficiently. Also, antibiotics will work better for us when they are prescribed for bacterial infections. Treatments for a child that is ill may include:  Give extra fluids throughout the day to stay hydrated.   Get plenty of rest.   Only give your child over-the-counter or prescription medicines for pain, discomfort, or fever as directed by your caregiver.   The use of a cool mist humidifier may help stuffy noses.   Cold medications if suggested by your caregiver.  Your caregiver may decide to start you on an antibiotic if:  The problem you were seen for today continues for a longer length of time than expected.   You develop a secondary bacterial infection.  SEEK MEDICAL CARE IF:  Fever lasts longer than 5 days.   Symptoms continue to get worse after 5 to 7 days or become severe.   Difficulty in breathing develops.   Signs of dehydration develop (poor drinking, rare urinating, dark colored urine).   Changes in behavior or worsening tiredness (listlessness or lethargy).  Document Released: 08/12/2001 Document Revised: 05/23/2011 Document Reviewed: 02/08/2009 ExitCare Patient Information 2012 [employer].Antibiotic Nonuse  Your caregiver felt that the infection or problem was not one that would be helped with an antibiotic. Infections may be caused by viruses or bacteria. Only a caregiver can tell which one of these   is the likely cause of an illness. A cold is the most common cause of infection in both adults and children. A cold is a virus. Antibiotic treatment will have no effect on a viral infection. Viruses can lead to many lost days of work caring for sick children and many missed days of school. Children may catch as many as 10 "colds" or "flus" per year during which they can be  tearful, cranky, and uncomfortable. The goal of treating a virus is aimed at keeping the ill person comfortable. Antibiotics are medications used to help the body fight bacterial infections. There are relatively few types of bacteria that cause infections but there are hundreds of viruses. While both viruses and bacteria cause infection they are very different types of germs. A viral infection will typically go away by itself within 7 to 10 days. Bacterial infections may spread or get worse without antibiotic treatment. Examples of bacterial infections are:  Sore throats (like strep throat or tonsillitis).   Infection in the lung (pneumonia).   Ear and skin infections.  Examples of viral infections are:  Colds or flus.   Most coughs and bronchitis.   Sore throats not caused by Strep.   Runny noses.  It is often best not to take an antibiotic when a viral infection is the cause of the problem. Antibiotics can kill off the helpful bacteria that we have inside our body and allow harmful bacteria to start growing. Antibiotics can cause side effects such as allergies, nausea, and diarrhea without helping to improve the symptoms of the viral infection. Additionally, repeated uses of antibiotics can cause bacteria inside of our body to become resistant. That resistance can be passed onto harmful bacterial. The next time you have an infection it may be harder to treat if antibiotics are used when they are not needed. Not treating with antibiotics allows our own immune system to develop and take care of infections more efficiently. Also, antibiotics will work better for us when they are prescribed for bacterial infections. Treatments for a child that is ill may include:  Give extra fluids throughout the day to stay hydrated.   Get plenty of rest.   Only give your child over-the-counter or prescription medicines for pain, discomfort, or fever as directed by your caregiver.   The use of a cool mist  humidifier may help stuffy noses.   Cold medications if suggested by your caregiver.  Your caregiver may decide to start you on an antibiotic if:  The problem you were seen for today continues for a longer length of time than expected.   You develop a secondary bacterial infection.  SEEK MEDICAL CARE IF:  Fever lasts longer than 5 days.   Symptoms continue to get worse after 5 to 7 days or become severe.   Difficulty in breathing develops.   Signs of dehydration develop (poor drinking, rare urinating, dark colored urine).   Changes in behavior or worsening tiredness (listlessness or lethargy).  Document Released: 08/12/2001 Document Revised: 05/23/2011 Document Reviewed: 02/08/2009 ExitCare Patient Information 2012 [employer]. 

## 2011-08-27 NOTE — ED Provider Notes (Signed)
This chart was scribed for Sunnie Nielsen, MD by Wallis Mart. The patient was seen in room MH01/MH01 and the patient's care was started at 12:50 AM.   CSN: [id number]  Arrival date & time 08/26/11  2252   None     Chief Complaint  Patient presents with  . URI    (Consider location/radiation/quality/duration/timing/severity/associated sxs/prior treatment) HPI  Kimberly [name] is a 21 y.o. female who presents to the Emergency Department complaining of sudden onset, persistence of constant, gradually worsening right ear pain onset 2 days ago.  Pt c/o associated sore throat, headache, cough. Pt has taken otc pain medication w/ no relief of sx.  There are no other associated symptoms and no other alleviating or aggravating factors.   Past Medical History  Diagnosis Date  . Pregnancy induced hypertension     had HBP in past but lowered it, no meds  . Ovarian cyst     left side    Past Surgical History  Procedure Date  . Tonsillectomy   . Wisdom tooth extraction   . Dilation and curettage of uterus   . Cesarean section 04/09/2011    Procedure: CESAREAN SECTION;  Surgeon: Loney Laurence;  Location: [facility];  Service: Gynecology;  Laterality: N/A;    Family History  Problem Relation Age of Onset  . Diabetes Mother   . Diabetes Maternal Grandmother   . Diabetes Maternal Grandfather   . Diabetes Paternal Grandmother   . Stroke Paternal Grandmother     History  Substance Use Topics  . Smoking status: Never Smoker   . Smokeless tobacco: Not on file  . Alcohol Use: No    OB History    Grav Para Term Preterm Abortions TAB SAB Ect Mult Living   4 1 0 1 3 1 1 1 0 1       Review of Systems  10 Systems reviewed and are negative for acute change except as noted in the HPI.   Allergies  Lemon juice; Orange juice; Citrus; Penicillins; Sulfa antibiotics; Amoxil; and Latex  Home Medications   Current Outpatient Rx  Name Route Sig Dispense Refill  . PRENATAL PLUS  27-1 MG PO TABS Oral Take 1 tablet by mouth daily.      Marland Kitchen RANITIDINE HCL 75 MG PO TABS Oral Take 75 mg by mouth 2 (two) times daily. For heartburn       BP 142/82  Pulse 92  Temp(Src) 98.1 F (36.7 C) (Oral)  Resp 18  Ht 5\' 6"  (1.676 m)  Wt 340 lb (154.223 kg)  BMI 54.88 kg/m2  SpO2 100%  LMP 08/09/2011  Breastfeeding? No  Physical Exam  Nursing note and vitals reviewed. Constitutional: She is oriented to person, place, and time. She appears well-developed and well-nourished. No distress.  HENT:  Head: Normocephalic and atraumatic.  Right Ear: External ear normal.  Left Ear: External ear normal.  Mouth/Throat: Oropharynx is clear and moist.       Posterior oropharynx clear No auricular tenderness  Eyes: EOM are normal. Pupils are equal, round, and reactive to light.  Neck: Neck supple. No tracheal deviation present.  Cardiovascular: Normal rate and regular rhythm.   Pulmonary/Chest: Effort normal. No respiratory distress. She has no wheezes.       Mildly decreased breath sounds   Abdominal: Soft. She exhibits no distension.  Musculoskeletal: Normal range of motion. She exhibits no edema.  Neurological: She is alert and oriented to person, place, and time. No sensory deficit.  Skin:  Skin is warm and dry.  Psychiatric: She has a normal mood and affect. Her behavior is normal.    ED Course  Procedures (including critical care time) DIAGNOSTIC STUDIES: Oxygen Saturation is 100% on room air, normal by my interpretation.    COORDINATION OF CARE:   Albuterol inhaler 2 puffs provided  Motrin by mouth  Auralgan ear drops   MDM  URI symptoms with associated earache and sore throat. Patient feels like she is wheezing although has clear lung sounds. Albuterol inhaler did seem to improve her breathing she is about the same. Room air pulse ox is 100% and adequate.  No indication for chest x-ray at this time. Patient also given auralgan ear drops and Motrin for sore throat.  She is a reliable historian and stable for discharge home with URI precautions verbalized as understood.  I personally performed the services described in this documentation, which was scribed in my presence. The recorded information has been reviewed and considered.        Sunnie Nielsen, MD 08/27/11 [phone]

## 2011-09-06 IMAGING — US US OB COMP LESS 14 WK
1 series · 14 of 28 positions shown · non-contrast
Comparison: None.

CLINICAL DATA: Pregnant female with left-sided pain.  Quantitative
HCG 217.

OBSTETRIC <14 WK US AND TRANSVAGINAL OB US
TECHNIQUE: Both transabdominal and transvaginal ultrasound
examinations were performed for complete evaluation of the
gestation as well as the maternal uterus, adnexal regions, and
pelvic cul-de-sac.  Transvaginal technique was performed to assess
early pregnancy.

[Series 1: us ob comp less 14 wks · 47 acquisitions, 14 frames shown]
[im 2/47]
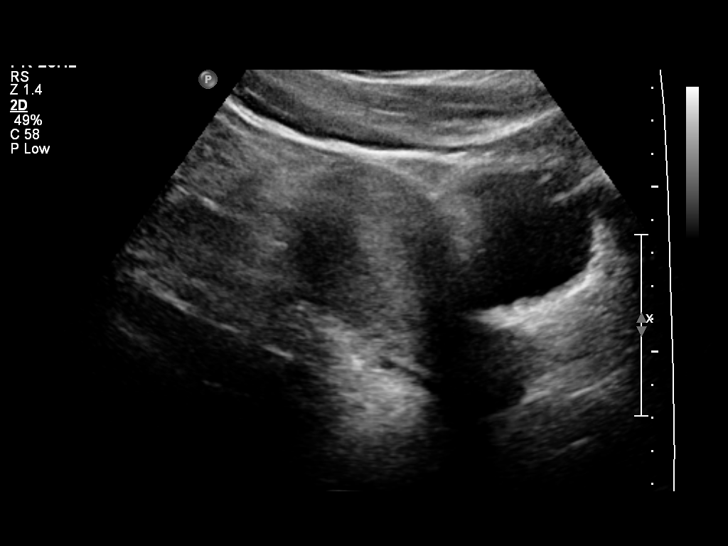
[im 6/47]
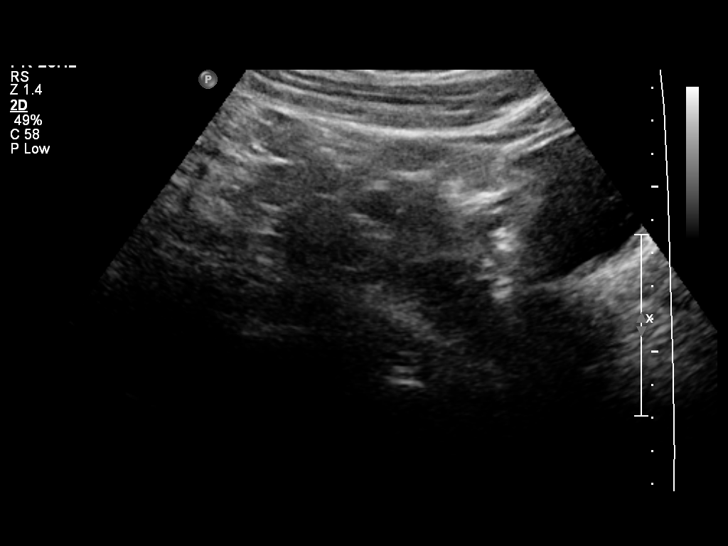
[im 9/47]
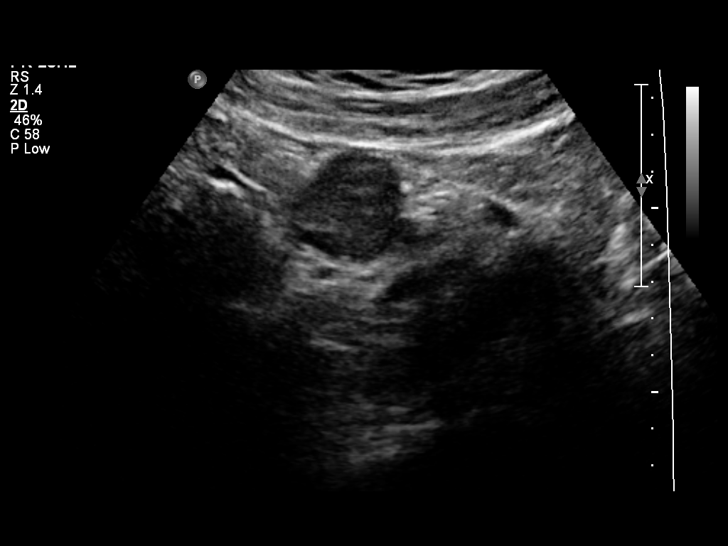
[im 12/47]
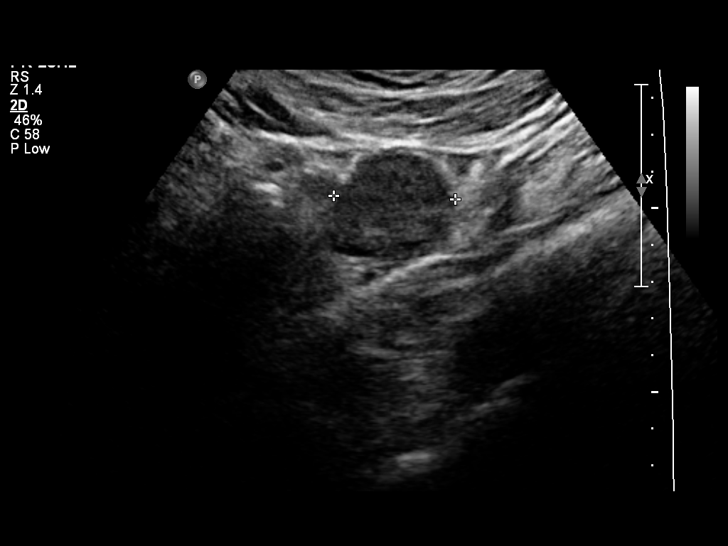
[im 16/47]
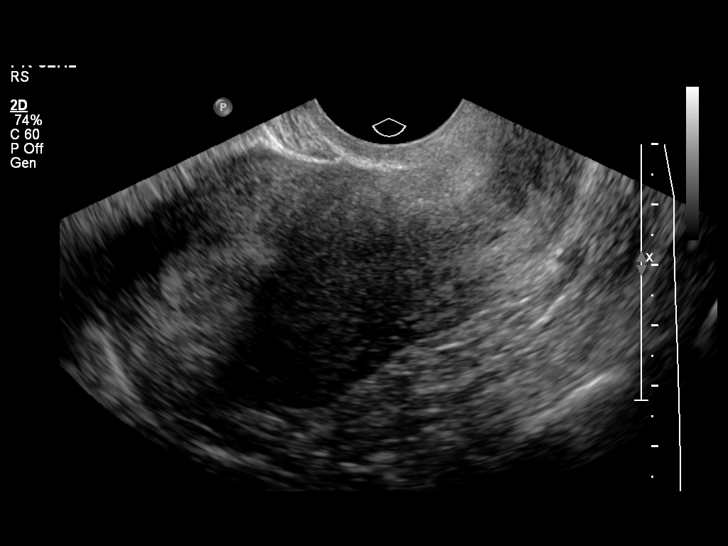
[im 19/47]
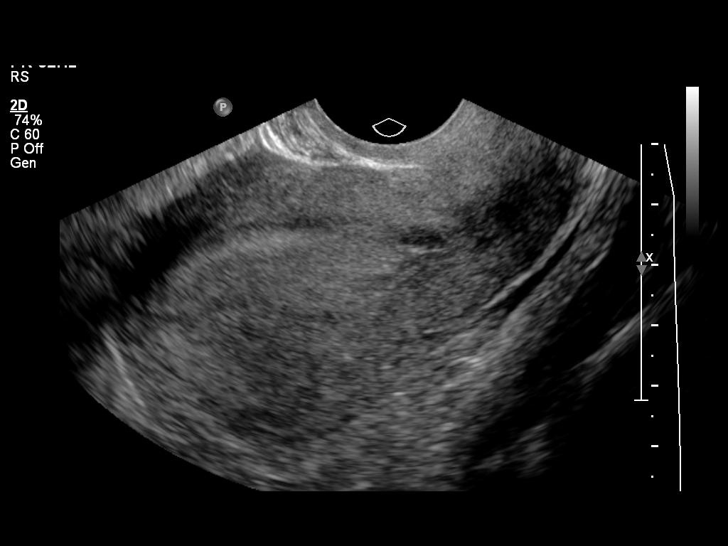
[im 23/47]
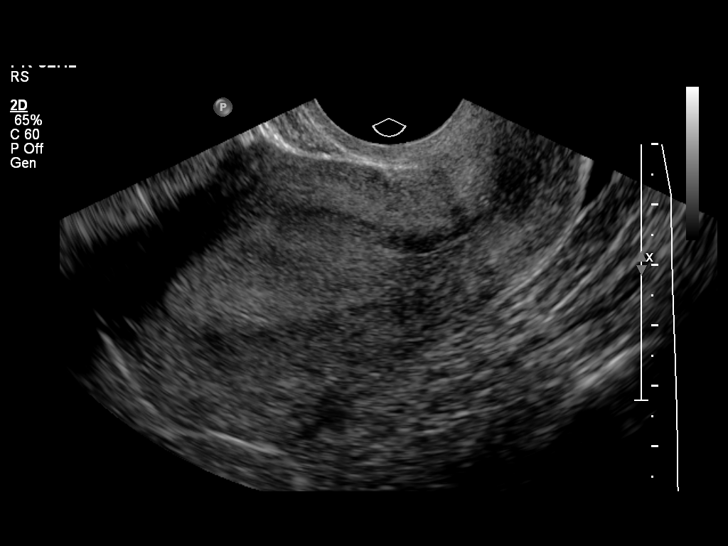
[im 26/47]
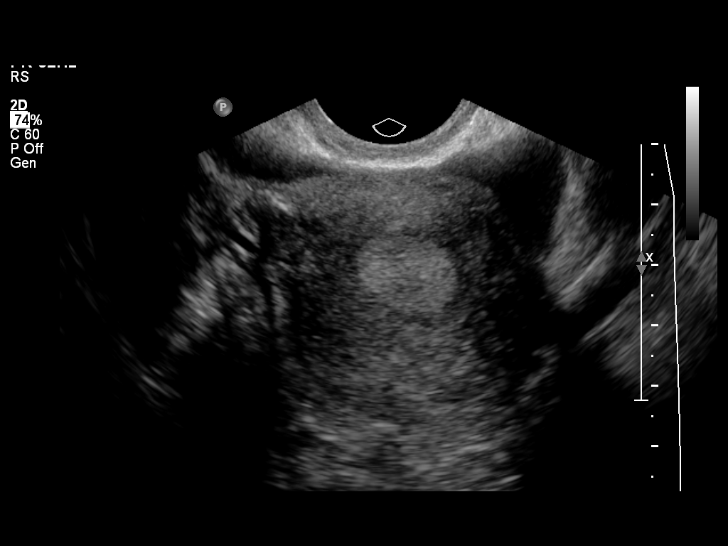
[im 29/47]
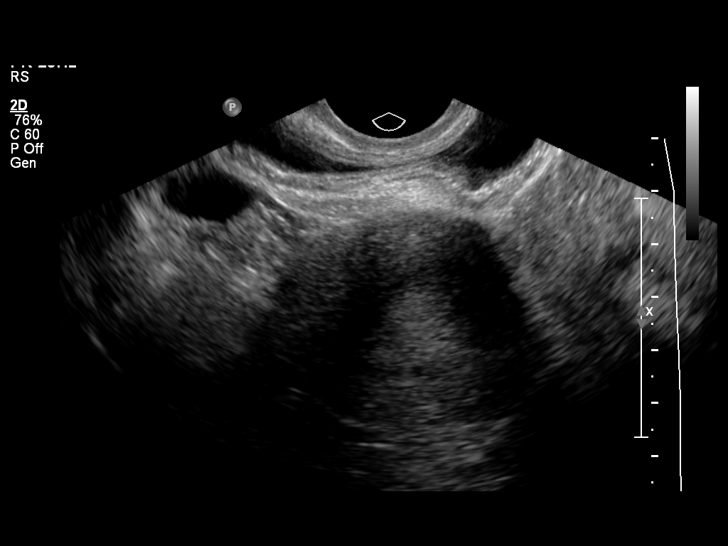
[im 33/47]
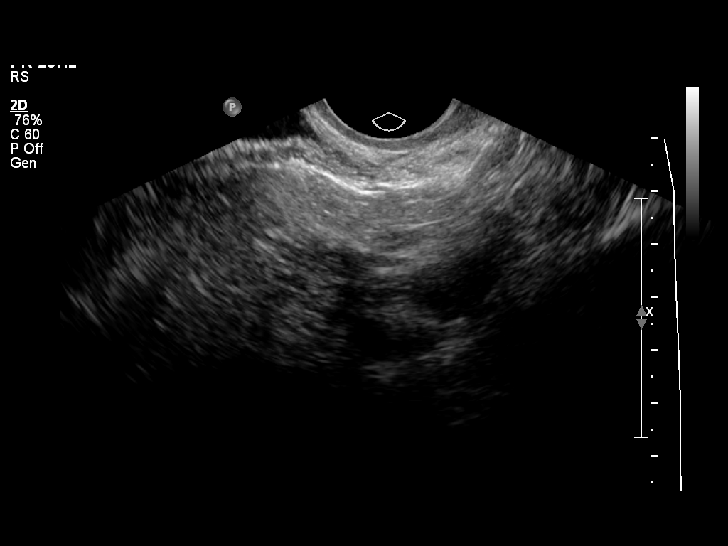
[im 36/47]
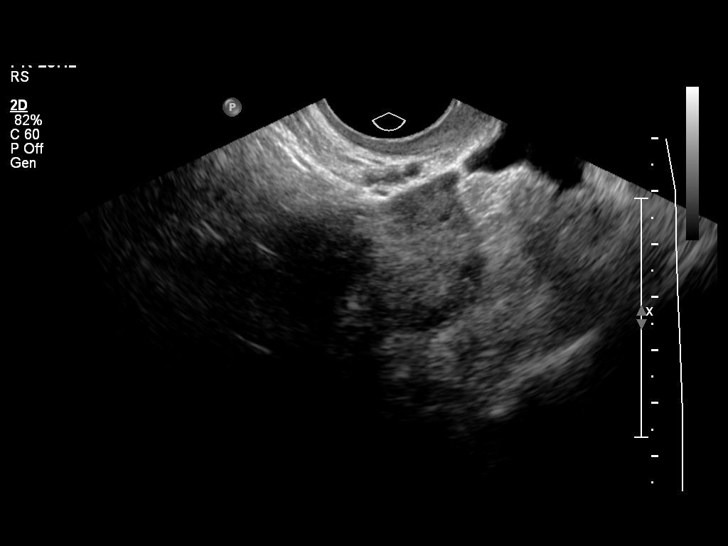
[im 40/47]
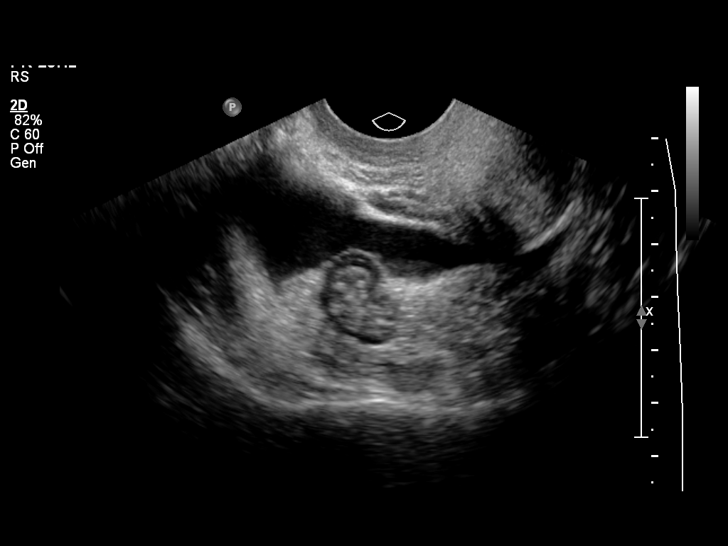
[im 43/47]
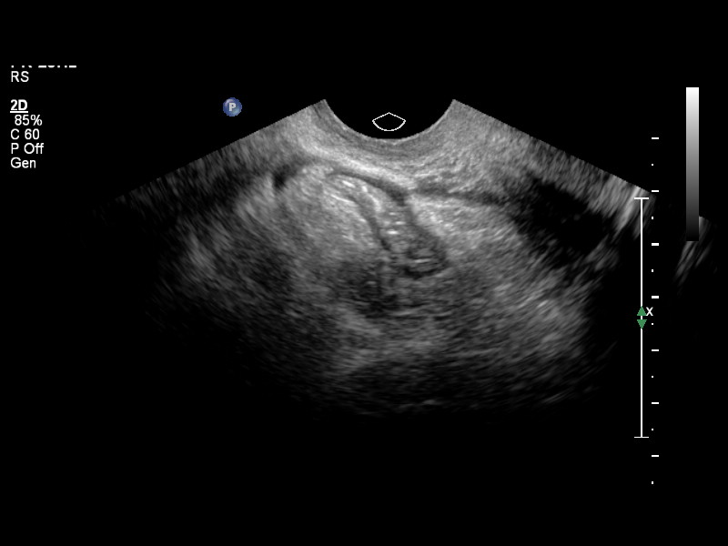
[im 47/47]
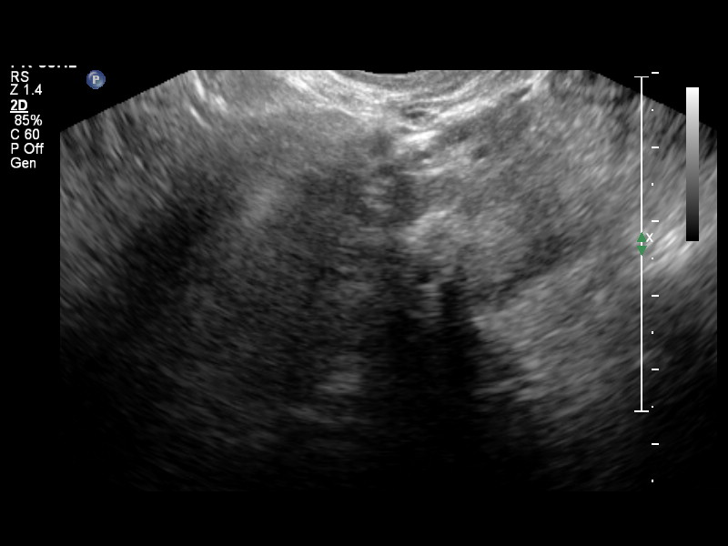

[14 of 28 positions shown; findings below may reference images not displayed]

FINDINGS: The study is limited by the patient's body habitus.
Endometrial stripe thickness is 1.8 cm.  No gestational sac is
identified within the uterus.  No adnexal mass is seen.  The right
ovary measures 3.0 x 2.4 x 1.6 cm and appears normal.  The left
ovary measures 3.3 x 3.1 x 2.7 cm.  Small amount of free pelvic
fluid is noted.
IMPRESSION: Negative for intrauterine or ectopic pregnancy.  Given low
quantitative HCG, differential considerations include early IUP,
fetal demise or occult ectopic.  Recommend serial quantitative HCG
follow-up.  Repeat ultrasound as needed.

## 2011-10-12 ENCOUNTER — Encounter (HOSPITAL_COMMUNITY): Payer: Self-pay | Admitting: Obstetrics and Gynecology

## 2011-10-12 ENCOUNTER — Inpatient Hospital Stay (HOSPITAL_COMMUNITY)
Admission: AD | Admit: 2011-10-12 | Discharge: 2011-10-12 | Disposition: A | Discharge status: Home / Self Care | Payer: Medicaid Other | Source: Ambulatory Visit | Attending: Obstetrics & Gynecology | Admitting: Obstetrics & Gynecology

## 2011-10-12 DIAGNOSIS — N912 Amenorrhea, unspecified: Secondary | ICD-10-CM | POA: Insufficient documentation

## 2011-10-12 DIAGNOSIS — N926 Irregular menstruation, unspecified: Secondary | ICD-10-CM

## 2011-10-12 DIAGNOSIS — R109 Unspecified abdominal pain: Secondary | ICD-10-CM | POA: Insufficient documentation

## 2011-10-12 DIAGNOSIS — N910 Primary amenorrhea: Secondary | ICD-10-CM

## 2011-10-12 LAB — URINALYSIS, ROUTINE W REFLEX MICROSCOPIC
Bilirubin Urine: NEGATIVE
Glucose, UA: NEGATIVE mg/dL
Hgb urine dipstick: NEGATIVE
Ketones, ur: NEGATIVE mg/dL
Protein, ur: NEGATIVE mg/dL
pH: 6 (ref 5.0–8.0)

## 2011-10-12 LAB — WET PREP, GENITAL
Clue Cells Wet Prep HPF POC: NONE SEEN
Trich, Wet Prep: NONE SEEN
Yeast Wet Prep HPF POC: NONE SEEN

## 2011-10-12 NOTE — MAU Provider Note (Signed)
History     CSN: [id number]  Arrival date and time: 10/12/11 1427   First Provider Initiated Contact with Patient 10/12/11 1519     21 [id number]  Chief Complaint  Patient presents with  . Abdominal Pain  . Amenorrhea   HPI Pt presents reporting she is 5 days late on her period which is usually regular and she is having abdominal cramping.  She was using contraceptive patch for birth control but because of elevated BP, Dr Arlyce Dice discontinued this and pt has birth control pills to start when her period starts.  She denies h/a, urinary symptoms, dizziness, n/v, fever or chills.    OB History    Grav Para Term Preterm Abortions TAB SAB Ect Mult Living   4 1 0 1 3 1 1 1 0 1       Past Medical History  Diagnosis Date  . Pregnancy induced hypertension     had HBP in past but lowered it, no meds  . Ovarian cyst     left side    Past Surgical History  Procedure Date  . Tonsillectomy   . Wisdom tooth extraction   . Dilation and curettage of uterus   . Cesarean section 04/09/2011    Procedure: CESAREAN SECTION;  Surgeon: Loney Laurence;  Location: [facility];  Service: Gynecology;  Laterality: N/A;    Family History  Problem Relation Age of Onset  . Diabetes Mother   . Diabetes Maternal Grandmother   . Diabetes Maternal Grandfather   . Diabetes Paternal Grandmother   . Stroke Paternal Grandmother     History  Substance Use Topics  . Smoking status: Never Smoker   . Smokeless tobacco: Not on file  . Alcohol Use: No    Allergies:  Allergies  Allergen Reactions  . Lemon Juice   . Orange Juice   . Citrus   . Penicillins Itching  . Sulfa Antibiotics Itching  . Amoxil Rash  . Latex Itching and Rash    No prescriptions prior to admission    Review of Systems  Constitutional: Negative for fever, chills and malaise/fatigue.  Eyes: Negative for blurred vision.  Respiratory: Negative for cough and shortness of breath.   Cardiovascular: Negative for chest pain.    Gastrointestinal: Positive for abdominal pain. Negative for heartburn, nausea and vomiting.  Genitourinary: Negative for dysuria, urgency and frequency.  Musculoskeletal: Negative.   Neurological: Negative for dizziness and headaches.  Psychiatric/Behavioral: Negative for depression.   Physical Exam   Temperature 98.5 F (36.9 C), temperature source Oral, resp. rate 20, height 5' 7.5" (1.715 m), weight 159.848 kg (352 lb 6.4 oz), last menstrual period 09/07/2011, unknown if currently breastfeeding.  Physical Exam  Nursing note and vitals reviewed. Constitutional: She is oriented to person, place, and time. She appears well-developed and well-nourished.  Neck: Normal range of motion.  Cardiovascular: Normal rate, regular rhythm and normal heart sounds.   Respiratory: Effort normal and breath sounds normal.  GI: Soft.  Genitourinary:       Pelvic exam: Cervix pink, visually closed, scant clear discharge Bimanual exam: Cervix 0/long/high, firm, anterior, neg CMT, uterus nontender upon palpation, nonenlarged, adnexa without tenderness, enlargement or mass   Musculoskeletal: Normal range of motion.  Neurological: She is alert and oriented to person, place, and time.  Skin: Skin is warm and dry.  Psychiatric: She has a normal mood and affect. Her behavior is normal. Judgment and thought content normal.   Results for orders placed during the hospital encounter  of 10/12/11 (from the past 24 hour(s))  URINALYSIS, ROUTINE W REFLEX MICROSCOPIC     Status: Normal   Collection Time   10/12/11  2:36 PM      Component Value Range   Color, Urine YELLOW  YELLOW    APPearance CLEAR  CLEAR    Specific Gravity, Urine 1.025  1.005 - 1.030    pH 6.0  5.0 - 8.0    Glucose, UA NEGATIVE  NEGATIVE (mg/dL)   Hgb urine dipstick NEGATIVE  NEGATIVE    Bilirubin Urine NEGATIVE  NEGATIVE    Ketones, ur NEGATIVE  NEGATIVE (mg/dL)   Protein, ur NEGATIVE  NEGATIVE (mg/dL)   Urobilinogen, UA 0.2  0.0 - 1.0  (mg/dL)   Nitrite NEGATIVE  NEGATIVE    Leukocytes, UA NEGATIVE  NEGATIVE   POCT PREGNANCY, URINE     Status: Normal   Collection Time   10/12/11  2:44 PM      Component Value Range   Preg Test, Ur NEGATIVE  NEGATIVE   WET PREP, GENITAL     Status: Abnormal   Collection Time   10/12/11  3:32 PM      Component Value Range   Yeast Wet Prep HPF POC NONE SEEN  NONE SEEN    Trich, Wet Prep NONE SEEN  NONE SEEN    Clue Cells Wet Prep HPF POC NONE SEEN  NONE SEEN    WBC, Wet Prep HPF POC FEW (*) NONE SEEN    MAU Course  Procedures Pelvic exam with wet prep, GC/Chlamydia  Assessment and Plan  Delayed menses  Called Dr Arlyce Dice to review results and assessment.   D/C home F/U with Dr Arlyce Dice if no menses in 2 weeks Use backup birth control Return to MAU as needed    LEFTWICH-KIRBY, Nekisha Mcdiarmid 10/12/2011, 4:27 PM

## 2011-10-12 NOTE — Discharge Instructions (Signed)
Secondary Amenorrhea  Secondary amenorrhea is the stopping of menstrual flow for 3 to 6 months in a female who has previously had periods. There are many possible causes. Most of these causes are not serious. Usually treating the underlying problem causing the loss of menses will return your periods to normal. CAUSES  Some common and uncommon causes of not menstruating include:  Malnutrition.   Low blood sugar (hypoglycemia).   Polycystic ovarian disease.   Stress or fear.   Breastfeeding.   Hormone imbalance.   Ovarian failure.   Medications.   Extreme obesity.   Cystic fibrosis.   Low body weight or drastic weight reduction from any cause.   Early menopause.   Removal of ovaries or uterus.   Contraceptives.   Illness.   Long term (chronic) illnesses.   Cushing's syndrome.   Thyroid problems.   Birth control pills, patches, or vaginal rings for birth control.  DIAGNOSIS  This diagnosis is made by your caregiver taking a medical history and doing a physical exam. Pregnancy must be ruled out. Often times, numerous blood tests of different hormones in the body may be measured. Urine testing may be done. Specialized x-rays may have to be done as well as measuring the body mass index (BMI). TREATMENT  Treatment depends on the cause of the amenorrhea. If an eating disorder is present, this can be treated with an adequate diet and therapy. Chronic illnesses may improve with treatment of the illness. Overall, the outlook is good. The amenorrhea may be corrected with medications, lifestyle changes, or surgery. If the amenorrhea cannot be corrected, it is sometimes possible to create a false menstruation with medications. Document Released: 07/15/2006 Document Revised: 05/23/2011 Document Reviewed: 05/22/2007 [employer] Patient Information 2012 [employer]. 

## 2011-10-12 NOTE — MAU Note (Signed)
"  My friends and I all start our periods at the same time, but I'm the only one that have not started mine this time.  It was supposed to start on Monday (10/07/11) and be off by Friday (10/11/11).  My back is hurting so bad.  I took 2 UPTs at home between yesterday and today; both were negative.  I keep thinking maybe it's coming on or not.  Dr. Arlyce Dice took my off the patch 2 weeks because it was elevating my BP.  I have BP pills in the car, but he told me not to take them until my period started.  I've been using condoms."

## 2011-10-14 LAB — GC/CHLAMYDIA PROBE AMP, GENITAL: Chlamydia, DNA Probe: NEGATIVE

## 2011-12-03 ENCOUNTER — Emergency Department (HOSPITAL_BASED_OUTPATIENT_CLINIC_OR_DEPARTMENT_OTHER)
Admission: EM | Admit: 2011-12-03 | Discharge: 2011-12-03 | Disposition: A | Discharge status: Home / Self Care | Payer: Medicaid Other | Attending: Emergency Medicine | Admitting: Emergency Medicine

## 2011-12-03 ENCOUNTER — Encounter (HOSPITAL_BASED_OUTPATIENT_CLINIC_OR_DEPARTMENT_OTHER): Payer: Self-pay

## 2011-12-03 DIAGNOSIS — R1110 Vomiting, unspecified: Secondary | ICD-10-CM | POA: Insufficient documentation

## 2011-12-03 DIAGNOSIS — Z823 Family history of stroke: Secondary | ICD-10-CM | POA: Insufficient documentation

## 2011-12-03 DIAGNOSIS — IMO0001 Reserved for inherently not codable concepts without codable children: Secondary | ICD-10-CM | POA: Insufficient documentation

## 2011-12-03 DIAGNOSIS — M2550 Pain in unspecified joint: Secondary | ICD-10-CM | POA: Insufficient documentation

## 2011-12-03 DIAGNOSIS — R197 Diarrhea, unspecified: Secondary | ICD-10-CM | POA: Insufficient documentation

## 2011-12-03 DIAGNOSIS — Z833 Family history of diabetes mellitus: Secondary | ICD-10-CM | POA: Insufficient documentation

## 2011-12-03 LAB — URINALYSIS, ROUTINE W REFLEX MICROSCOPIC
Glucose, UA: NEGATIVE mg/dL
Ketones, ur: NEGATIVE mg/dL
Leukocytes, UA: NEGATIVE
pH: 6 (ref 5.0–8.0)

## 2011-12-03 LAB — DIFFERENTIAL
Basophils Absolute: 0 10*3/uL (ref 0.0–0.1)
Eosinophils Absolute: 0 10*3/uL (ref 0.0–0.7)
Lymphocytes Relative: 10 % — ABNORMAL LOW (ref 12–46)
Neutro Abs: 7.7 10*3/uL (ref 1.7–7.7)
Neutrophils Relative %: 84 % — ABNORMAL HIGH (ref 43–77)

## 2011-12-03 LAB — COMPREHENSIVE METABOLIC PANEL
Albumin: 4.1 g/dL (ref 3.5–5.2)
Alkaline Phosphatase: 102 U/L (ref 39–117)
BUN: 7 mg/dL (ref 6–23)
Potassium: 3.6 mEq/L (ref 3.5–5.1)
Total Protein: 9 g/dL — ABNORMAL HIGH (ref 6.0–8.3)

## 2011-12-03 LAB — URINE MICROSCOPIC-ADD ON

## 2011-12-03 LAB — CBC
HCT: 37.2 % (ref 36.0–46.0)
MCHC: 33.6 g/dL (ref 30.0–36.0)
Platelets: 334 10*3/uL (ref 150–400)
RDW: 16.2 % — ABNORMAL HIGH (ref 11.5–15.5)

## 2011-12-03 LAB — PREGNANCY, URINE: Preg Test, Ur: NEGATIVE

## 2011-12-03 MED ORDER — ONDANSETRON 4 MG PO TBDP: 4.0000 mg | ORAL_TABLET | Freq: Three times a day (TID) | ORAL | Status: AC | PRN

## 2011-12-03 MED ORDER — ONDANSETRON HCL 4 MG/2ML IJ SOLN
4.0000 mg | Freq: Once | INTRAMUSCULAR | Status: AC
  Administered 2011-12-03: 4 mg via INTRAVENOUS
  Filled 2011-12-03: qty 2

## 2011-12-03 NOTE — ED Notes (Signed)
Pt is unable to void at present time. 

## 2011-12-03 NOTE — ED Provider Notes (Signed)
Medical screening examination/treatment/procedure(s) were performed by non-physician practitioner and as supervising physician I was immediately available for consultation/collaboration.    Nelia Shi, MD 12/03/11 2258

## 2011-12-03 NOTE — Discharge Instructions (Signed)
Nausea and Vomiting  Nausea is a sick feeling that often comes before throwing up (vomiting). Vomiting is a reflex where stomach contents come out of your mouth. Vomiting can cause severe loss of body fluids (dehydration). Children and elderly adults can become dehydrated quickly, especially if they also have diarrhea. Nausea and vomiting are symptoms of a condition or disease. It is important to find the cause of your symptoms.  CAUSES    Direct irritation of the stomach lining. This irritation can result from increased acid production (gastroesophageal reflux disease), infection, food poisoning, taking certain medicines (such as nonsteroidal anti-inflammatory drugs), alcohol use, or tobacco use.   Signals from the brain.These signals could be caused by a headache, heat exposure, an inner ear disturbance, increased pressure in the brain from injury, infection, a tumor, or a concussion, pain, emotional stimulus, or metabolic problems.   An obstruction in the gastrointestinal tract (bowel obstruction).   Illnesses such as diabetes, hepatitis, gallbladder problems, appendicitis, kidney problems, cancer, sepsis, atypical symptoms of a heart attack, or eating disorders.   Medical treatments such as chemotherapy and radiation.   Receiving medicine that makes you sleep (general anesthetic) during surgery.  DIAGNOSIS  Your caregiver may ask for tests to be done if the problems do not improve after a few days. Tests may also be done if symptoms are severe or if the reason for the nausea and vomiting is not clear. Tests may include:   Urine tests.   Blood tests.   Stool tests.   Cultures (to look for evidence of infection).   X-rays or other imaging studies.  Test results can help your caregiver make decisions about treatment or the need for additional tests.  TREATMENT  You need to stay well hydrated. Drink frequently but in small amounts.You may wish to drink water, sports drinks, clear broth, or eat frozen  ice pops or gelatin dessert to help stay hydrated.When you eat, eating slowly may help prevent nausea.There are also some antinausea medicines that may help prevent nausea.  HOME CARE INSTRUCTIONS    Take all medicine as directed by your caregiver.   If you do not have an appetite, do not force yourself to eat. However, you must continue to drink fluids.   If you have an appetite, eat a normal diet unless your caregiver tells you differently.   Eat a variety of complex carbohydrates (rice, wheat, potatoes, bread), lean meats, yogurt, fruits, and vegetables.   Avoid high-fat foods because they are more difficult to digest.   Drink enough water and fluids to keep your urine clear or pale yellow.   If you are dehydrated, ask your caregiver for specific rehydration instructions. Signs of dehydration may include:   Severe thirst.   Dry lips and mouth.   Dizziness.   Dark urine.   Decreasing urine frequency and amount.   Confusion.   Rapid breathing or pulse.  SEEK IMMEDIATE MEDICAL CARE IF:    You have blood or brown flecks (like coffee grounds) in your vomit.   You have black or bloody stools.   You have a severe headache or stiff neck.   You are confused.   You have severe abdominal pain.   You have chest pain or trouble breathing.   You do not urinate at least once every 8 hours.   You develop cold or clammy skin.   You continue to vomit for longer than 24 to 48 hours.   You have a fever.  MAKE SURE YOU:      Understand these instructions.   Will watch your condition.   Will get help right away if you are not doing well or get worse.  Document Released: 06/03/2005 Document Revised: 05/23/2011 Document Reviewed: 10/31/2010  [employer] Patient Information 2012 [employer].

## 2011-12-03 NOTE — ED Notes (Signed)
Report received and care assumed.

## 2011-12-03 NOTE — ED Notes (Signed)
Patient here with general abdominal cramping with nausea, vomiting and diarrhea since yesterday. Reports chills and headache with same. Alert and oriented and ambulatory

## 2011-12-03 NOTE — ED Provider Notes (Signed)
History     CSN: [id number]  Arrival date & time 12/03/11  1238   First MD Initiated Contact with Patient 12/03/11 1259      Chief Complaint  Patient presents with  . vomiting, diarrhea, fever     (Consider location/radiation/quality/duration/timing/severity/associated sxs/prior treatment) Patient is a 21 y.o. female presenting with vomiting. The history is provided by the patient. No language interpreter was used.  Emesis  This is a new problem. The current episode started 6 to 12 hours ago. The problem occurs 5 to 10 times per day. The problem has been gradually worsening. The emesis has an appearance of stomach contents. There has been no fever. Associated symptoms include arthralgias, diarrhea and myalgias.  Pt complains of vomitting and diarrhea    Past Medical History  Diagnosis Date  . Ovarian cyst     left side    Past Surgical History  Procedure Date  . Tonsillectomy   . Wisdom tooth extraction   . Dilation and curettage of uterus   . Cesarean section 04/09/2011    Procedure: CESAREAN SECTION;  Surgeon: Loney Laurence;  Location: [facility];  Service: Gynecology;  Laterality: N/A;    Family History  Problem Relation Age of Onset  . Diabetes Mother   . Diabetes Maternal Grandmother   . Diabetes Maternal Grandfather   . Diabetes Paternal Grandmother   . Stroke Paternal Grandmother     History  Substance Use Topics  . Smoking status: Never Smoker   . Smokeless tobacco: Not on file  . Alcohol Use: No    OB History    Grav Para Term Preterm Abortions TAB SAB Ect Mult Living   4 1 0 1 3 1 1 1 0 1       Review of Systems  Gastrointestinal: Positive for vomiting and diarrhea.  Musculoskeletal: Positive for myalgias and arthralgias.  All other systems reviewed and are negative.    Allergies  Lemon juice; Orange juice; Citrus; Penicillins; Sulfa antibiotics; Amoxicillin; and Latex  Home Medications   Current Outpatient Rx  Name Route Sig Dispense  Refill  . IMODIUM A-D PO Oral Take 2 tablets by mouth daily as needed. Patient used this medication to help with diarrhea symptoms.      BP 143/67  Pulse 118  Temp 99.4 F (37.4 C) (Oral)  Resp 20  Wt 356 lb (161.481 kg)  SpO2 100%  Breastfeeding? Unknown  Physical Exam  Nursing note and vitals reviewed. Constitutional: She appears well-developed and well-nourished.  HENT:  Head: Normocephalic and atraumatic.  Right Ear: External ear normal.  Left Ear: External ear normal.  Nose: Nose normal.  Mouth/Throat: Oropharynx is clear and moist.  Eyes: Conjunctivae and EOM are normal. Pupils are equal, round, and reactive to light.  Neck: Normal range of motion. Neck supple.  Cardiovascular: Normal rate and normal heart sounds.   Pulmonary/Chest: Effort normal and breath sounds normal.  Abdominal: Soft. Bowel sounds are normal.  Musculoskeletal: Normal range of motion.  Neurological: She is alert.  Skin: Skin is warm.  Psychiatric: She has a normal mood and affect.    ED Course  Procedures (including critical care time)  Labs Reviewed  CBC - Abnormal; Notable for the following:    RBC 5.60 (*)     MCV 66.4 (*)     MCH 22.3 (*)     RDW 16.2 (*)     All other components within normal limits  DIFFERENTIAL - Abnormal; Notable for the following:  Neutrophils Relative 84 (*)     Lymphocytes Relative 10 (*)     All other components within normal limits  COMPREHENSIVE METABOLIC PANEL - Abnormal; Notable for the following:    Glucose, Bld 104 (*)     Total Protein 9.0 (*)     All other components within normal limits  URINALYSIS, ROUTINE W REFLEX MICROSCOPIC - Abnormal; Notable for the following:    APPearance CLOUDY (*)     Nitrite POSITIVE (*)     All other components within normal limits  URINE MICROSCOPIC-ADD ON - Abnormal; Notable for the following:    Squamous Epithelial / LPF FEW (*)     Bacteria, UA MANY (*)     All other components within normal limits  PREGNANCY,  URINE   No results found.   1. Vomiting and diarrhea       MDM  Pt given IV fluids and zofran.   Pt reports she feels better.  Pt given rx for zofran.   I advised her to return if symptoms last more than 24 hours        Lonia Skinner [place], [place] 12/03/11 [phone]

## 2012-11-21 ENCOUNTER — Encounter (HOSPITAL_BASED_OUTPATIENT_CLINIC_OR_DEPARTMENT_OTHER): Payer: Self-pay

## 2012-11-21 ENCOUNTER — Emergency Department (HOSPITAL_BASED_OUTPATIENT_CLINIC_OR_DEPARTMENT_OTHER)
Admission: EM | Admit: 2012-11-21 | Discharge: 2012-11-21 | Disposition: A | Discharge status: Home / Self Care | Payer: Self-pay | Attending: Emergency Medicine | Admitting: Emergency Medicine

## 2012-11-21 DIAGNOSIS — J3489 Other specified disorders of nose and nasal sinuses: Secondary | ICD-10-CM | POA: Insufficient documentation

## 2012-11-21 DIAGNOSIS — J069 Acute upper respiratory infection, unspecified: Secondary | ICD-10-CM | POA: Insufficient documentation

## 2012-11-21 DIAGNOSIS — J050 Acute obstructive laryngitis [croup]: Secondary | ICD-10-CM | POA: Insufficient documentation

## 2012-11-21 DIAGNOSIS — Z880 Allergy status to penicillin: Secondary | ICD-10-CM | POA: Insufficient documentation

## 2012-11-21 DIAGNOSIS — Z91040 Latex allergy status: Secondary | ICD-10-CM | POA: Insufficient documentation

## 2012-11-21 DIAGNOSIS — J329 Chronic sinusitis, unspecified: Secondary | ICD-10-CM | POA: Insufficient documentation

## 2012-11-21 DIAGNOSIS — J029 Acute pharyngitis, unspecified: Secondary | ICD-10-CM | POA: Insufficient documentation

## 2012-11-21 DIAGNOSIS — Z8742 Personal history of other diseases of the female genital tract: Secondary | ICD-10-CM | POA: Insufficient documentation

## 2012-11-21 DIAGNOSIS — H9209 Otalgia, unspecified ear: Secondary | ICD-10-CM | POA: Insufficient documentation

## 2012-11-21 MED ORDER — AZITHROMYCIN 250 MG PO TABS: 500.0000 mg | ORAL_TABLET | Freq: Every day | ORAL | Status: DC

## 2012-11-21 NOTE — ED Provider Notes (Signed)
History     CSN: [id number]  Arrival date & time 11/21/12  1124   First MD Initiated Contact with Patient 11/21/12 1134      Chief Complaint  Patient presents with  . Otitis Media    (Consider location/radiation/quality/duration/timing/severity/associated sxs/prior treatment) The history is provided by the patient.   22 year old female child at home with croup. Patient came down with upper respiratory type infection a few days ago. Complaining of sore throat bilateral ear pain facial pressure and congestion. No fevers. No history of strep in the past. Has a penicillin allergy.  Past Medical History  Diagnosis Date  . Ovarian cyst     left side    Past Surgical History  Procedure Laterality Date  . Tonsillectomy    . Wisdom tooth extraction    . Dilation and curettage of uterus    . Cesarean section  04/09/2011    Procedure: CESAREAN SECTION;  Surgeon: Loney Laurence;  Location: [facility];  Service: Gynecology;  Laterality: N/A;    Family History  Problem Relation Age of Onset  . Diabetes Mother   . Diabetes Maternal Grandmother   . Diabetes Maternal Grandfather   . Diabetes Paternal Grandmother   . Stroke Paternal Grandmother     History  Substance Use Topics  . Smoking status: Never Smoker   . Smokeless tobacco: Not on file  . Alcohol Use: No    OB History   Grav Para Term Preterm Abortions TAB SAB Ect Mult Living   4 1 0 1 3 1 1 1 0 1       Review of Systems  Constitutional: Negative for fever.  HENT: Positive for ear pain, congestion, sore throat and sinus pressure. Negative for facial swelling, trouble swallowing and neck pain.   Eyes: Negative for redness.  Respiratory: Negative for cough and shortness of breath.   Cardiovascular: Negative for chest pain.  Gastrointestinal: Negative for nausea, vomiting, abdominal pain and diarrhea.  Genitourinary: Negative for dysuria.  Musculoskeletal: Negative for back pain.  Skin: Negative for rash.   Hematological: Does not bruise/bleed easily.  Psychiatric/Behavioral: Negative for confusion.    Allergies  Lemon juice; Orange juice; Citrus; Penicillins; Sulfa antibiotics; Amoxicillin; and Latex  Home Medications   Current Outpatient Rx  Name  Route  Sig  Dispense  Refill  . azithromycin (ZITHROMAX Z-PAK) 250 MG tablet   Oral   Take 2 tablets (500 mg total) by mouth daily.   6 tablet   0   . Loperamide HCl (IMODIUM A-D PO)   Oral   Take 2 tablets by mouth daily as needed. Patient used this medication to help with diarrhea symptoms.           BP 143/78  Pulse 97  Temp(Src) 98 F (36.7 C) (Oral)  Resp 14  Ht 5\' 6"  (1.676 m)  Wt 253 lb (114.76 kg)  BMI 40.85 kg/m2  SpO2 99%  LMP 11/03/2012  Physical Exam  Nursing note and vitals reviewed. Constitutional: She is oriented to person, place, and time. She appears well-developed and well-nourished. No distress.  HENT:  Head: Normocephalic and atraumatic.  Right Ear: External ear normal.  Left Ear: External ear normal.  Left ear canal slightly erythematous no bulging. Good light reflex.  Eyes: Conjunctivae and EOM are normal. Pupils are equal, round, and reactive to light.  Neck: Normal range of motion. Neck supple.  Cardiovascular: Normal rate, regular rhythm and normal heart sounds.   No murmur heard. Pulmonary/Chest: Effort normal  and breath sounds normal. No respiratory distress. She has no wheezes. She has no rales.  Abdominal: Soft. Bowel sounds are normal. There is no tenderness.  Musculoskeletal: Normal range of motion.  Lymphadenopathy:    She has no cervical adenopathy.  Neurological: She is alert and oriented to person, place, and time. No cranial nerve deficit. She exhibits normal muscle tone. Coordination normal.  Skin: Skin is warm. No rash noted.    ED Course  Procedures (including critical care time)  Labs Reviewed - No data to display No results found.   1. Sinusitis   2. Upper respiratory  infection       MDM  Symptoms may be all viral upper respiratory infection based. Clinically there seems to be concern for sinusitis. Also with sore throat clinically does not seem to be suggestive of strep but will treat for sinusitis with Zithromax that would cover strep as well. Ears without evidence of otitis external or media. Patient is nontoxic no acute distress.        Shelda Jakes, MD 11/21/12 [phone]

## 2012-11-21 NOTE — ED Notes (Signed)
Pt c/o sore throat and bilateral ear pain, taking nothing for it.

## 2012-12-01 ENCOUNTER — Inpatient Hospital Stay (HOSPITAL_COMMUNITY)
Admission: AD | Admit: 2012-12-01 | Discharge: 2012-12-02 | Disposition: A | Discharge status: Home / Self Care | Payer: Self-pay | Source: Ambulatory Visit | Attending: Family Medicine | Admitting: Family Medicine

## 2012-12-01 DIAGNOSIS — Z3202 Encounter for pregnancy test, result negative: Secondary | ICD-10-CM | POA: Insufficient documentation

## 2012-12-01 DIAGNOSIS — R52 Pain, unspecified: Secondary | ICD-10-CM

## 2012-12-01 DIAGNOSIS — R1031 Right lower quadrant pain: Secondary | ICD-10-CM | POA: Insufficient documentation

## 2012-12-02 ENCOUNTER — Encounter (HOSPITAL_COMMUNITY): Payer: Self-pay | Admitting: *Deleted

## 2012-12-02 LAB — URINALYSIS, ROUTINE W REFLEX MICROSCOPIC
Glucose, UA: NEGATIVE mg/dL
Leukocytes, UA: NEGATIVE
pH: 6 (ref 5.0–8.0)

## 2012-12-02 LAB — POCT PREGNANCY, URINE: Preg Test, Ur: NEGATIVE

## 2012-12-02 LAB — URINE MICROSCOPIC-ADD ON

## 2012-12-02 MED ORDER — IBUPROFEN 800 MG PO TABS
800.0000 mg | ORAL_TABLET | Freq: Once | ORAL | Status: AC
  Administered 2012-12-02: 800 mg via ORAL
  Filled 2012-12-02: qty 1

## 2012-12-02 MED ORDER — KETOROLAC TROMETHAMINE 60 MG/2ML IM SOLN: 60.0000 mg | Freq: Once | INTRAMUSCULAR | Status: DC

## 2012-12-02 NOTE — MAU Provider Note (Signed)
Chart reviewed and agree with management and plan.  

## 2012-12-02 NOTE — MAU Provider Note (Signed)
History     CSN: [id number]  Arrival date and time: 12/01/12 2321   None     Chief Complaint  Patient presents with  . Abdominal Pain   HPI Pt is not pregnant and presents with RLQ pain like when she had an ectopic pregnancy.  Pt states that her pain is episodic and has been present for 2 days.  Pt denies nausea, vomiting, diarrhea or UTI symptoms or vaginal discharge or abnormal bleeding.  Pt states that her PMP was 3 days early and her LMP was 7 days late.  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 Pt has previous C/S  Claud Kelp, RN Registered Nurse Signed  MAU Note Service date: 12/02/2012 2:55 AM   Pt. Is having intense cramping on her rt lower abdomen. Pt. Isn't having any bleeding and she missed her period. She had a negative home pregnancy test.    Past Medical History  Diagnosis Date  . Ovarian cyst     left side    Past Surgical History  Procedure Laterality Date  . Tonsillectomy    . Wisdom tooth extraction    . Dilation and curettage of uterus    . Cesarean section  04/09/2011    Procedure: CESAREAN SECTION;  Surgeon: Loney Laurence;  Location: [facility];  Service: Gynecology;  Laterality: N/A;    Family History  Problem Relation Age of Onset  . Diabetes Mother   . Diabetes Maternal Grandmother   . Diabetes Maternal Grandfather   . Diabetes Paternal Grandmother   . Stroke Paternal Grandmother     History  Substance Use Topics  . Smoking status: Never Smoker   . Smokeless tobacco: Not on file  . Alcohol Use: No    Allergies:  Allergies  Allergen Reactions  . Lemon Juice   . Orange Juice   . Citrus   . Penicillins Itching  . Sulfa Antibiotics Itching  . Amoxicillin Rash  . Latex Itching and Rash    Prescriptions prior to admission  Medication Sig Dispense Refill  . azithromycin (ZITHROMAX Z-PAK) 250 MG tablet Take 2 tablets (500 mg total) by mouth daily.  6 tablet  0  . Loperamide HCl (IMODIUM A-D PO) Take 2 tablets by  mouth daily as needed. Patient used this medication to help with diarrhea symptoms.        Review of Systems  Constitutional: Negative for fever and chills.  Gastrointestinal: Positive for abdominal pain. Negative for nausea, vomiting, diarrhea and constipation.  Genitourinary: Negative for dysuria and urgency.   Physical Exam   Blood pressure 106/68, pulse 85, temperature 98.2 F (36.8 C), temperature source Oral, resp. rate 18, height 5' 7.5" (1.715 m), weight 162.104 kg (357 lb 6 oz), last menstrual period 11/03/2012, SpO2 100.00%.  Physical Exam  Nursing note and vitals reviewed. Constitutional: She is oriented to person, place, and time. She appears well-developed and well-nourished.  HENT:  Head: Normocephalic.  Eyes: Pupils are equal, round, and reactive to light.  Neck: Normal range of motion. Neck supple.  Cardiovascular: Normal rate.   Respiratory: Effort normal.  GI: Soft. She exhibits no distension. There is tenderness. There is no rebound.  Genitourinary: Vagina normal and uterus normal. No vaginal discharge found.  Mild cervical tenderness and mild right adnexal tenderness without appreciable enlargement  Musculoskeletal: Normal range of motion.  Neurological: She is alert and oriented to person, place, and time.  Skin: Skin is warm and dry.  Psychiatric: She has  a normal mood and affect.    MAU Course  Procedures   Results for orders placed during the hospital encounter of 12/01/12 (from the past 24 hour(s))  URINALYSIS, ROUTINE W REFLEX MICROSCOPIC     Status: Abnormal   Collection Time    12/02/12  3:03 AM      Result Value Range   Color, Urine YELLOW  YELLOW   APPearance CLEAR  CLEAR   Specific Gravity, Urine 1.025  1.005 - 1.030   pH 6.0  5.0 - 8.0   Glucose, UA NEGATIVE  NEGATIVE mg/dL   Hgb urine dipstick TRACE (*) NEGATIVE   Bilirubin Urine NEGATIVE  NEGATIVE   Ketones, ur 15 (*) NEGATIVE mg/dL   Protein, ur NEGATIVE  NEGATIVE mg/dL    Urobilinogen, UA 0.2  0.0 - 1.0 mg/dL   Nitrite NEGATIVE  NEGATIVE   Leukocytes, UA NEGATIVE  NEGATIVE  URINE MICROSCOPIC-ADD ON     Status: Abnormal   Collection Time    12/02/12  3:03 AM      Result Value Range   Squamous Epithelial / LPF FEW (*) RARE   WBC, UA 0-2  <3 WBC/hpf   RBC / HPF 0-2  <3 RBC/hpf   Bacteria, UA RARE  RARE   Urine-Other MUCOUS PRESENT    POCT PREGNANCY, URINE     Status: None   Collection Time    12/02/12  3:29 AM      Result Value Range   Preg Test, Ur NEGATIVE  NEGATIVE  WET PREP, GENITAL     Status: Abnormal   Collection Time    12/02/12  3:50 AM      Result Value Range   Yeast Wet Prep HPF POC NONE SEEN  NONE SEEN   Trich, Wet Prep NONE SEEN  NONE SEEN   Clue Cells Wet Prep HPF POC NONE SEEN  NONE SEEN   WBC, Wet Prep HPF POC FEW (*) NONE SEEN  HCG, SERUM, QUALITATIVE     Status: None   Collection Time    12/02/12  4:05 AM      Result Value Range   Preg, Serum NEGATIVE  NEGATIVE      Assessment and Plan  Abdominal pain Negative urine and serum pregnancy test F/u with provider of choice  Kimberly [name] 12/02/2012, 3:14 AM

## 2012-12-02 NOTE — MAU Note (Signed)
Pt. Is having intense cramping on her rt lower abdomen.  Pt. Isn't having any bleeding and she missed her period.  She had a negative home pregnancy test.

## 2013-01-12 ENCOUNTER — Emergency Department (HOSPITAL_BASED_OUTPATIENT_CLINIC_OR_DEPARTMENT_OTHER): Payer: Self-pay

## 2013-01-12 ENCOUNTER — Emergency Department (HOSPITAL_BASED_OUTPATIENT_CLINIC_OR_DEPARTMENT_OTHER)
Admission: EM | Admit: 2013-01-12 | Discharge: 2013-01-12 | Disposition: A | Discharge status: Home / Self Care | Payer: Self-pay | Attending: Emergency Medicine | Admitting: Emergency Medicine

## 2013-01-12 ENCOUNTER — Encounter (HOSPITAL_BASED_OUTPATIENT_CLINIC_OR_DEPARTMENT_OTHER): Payer: Self-pay | Admitting: *Deleted

## 2013-01-12 DIAGNOSIS — Z880 Allergy status to penicillin: Secondary | ICD-10-CM | POA: Insufficient documentation

## 2013-01-12 DIAGNOSIS — Z91040 Latex allergy status: Secondary | ICD-10-CM | POA: Insufficient documentation

## 2013-01-12 DIAGNOSIS — R0602 Shortness of breath: Secondary | ICD-10-CM | POA: Insufficient documentation

## 2013-01-12 DIAGNOSIS — J159 Unspecified bacterial pneumonia: Secondary | ICD-10-CM | POA: Insufficient documentation

## 2013-01-12 DIAGNOSIS — Z8742 Personal history of other diseases of the female genital tract: Secondary | ICD-10-CM | POA: Insufficient documentation

## 2013-01-12 DIAGNOSIS — R079 Chest pain, unspecified: Secondary | ICD-10-CM | POA: Insufficient documentation

## 2013-01-12 DIAGNOSIS — J3489 Other specified disorders of nose and nasal sinuses: Secondary | ICD-10-CM | POA: Insufficient documentation

## 2013-01-12 DIAGNOSIS — J189 Pneumonia, unspecified organism: Secondary | ICD-10-CM

## 2013-01-12 MED ORDER — HYDROCODONE-ACETAMINOPHEN 5-325 MG PO TABS: 2.0000 | ORAL_TABLET | ORAL | Status: DC | PRN

## 2013-01-12 MED ORDER — AZITHROMYCIN 250 MG PO TABS
500.0000 mg | ORAL_TABLET | Freq: Once | ORAL | Status: AC
  Administered 2013-01-12: 500 mg via ORAL
  Filled 2013-01-12: qty 2

## 2013-01-12 MED ORDER — AZITHROMYCIN 250 MG PO TABS: 250.0000 mg | ORAL_TABLET | Freq: Every day | ORAL | Status: DC

## 2013-01-12 NOTE — ED Provider Notes (Signed)
CSN: [id number]     Arrival date & time 01/12/13  1341 History     First MD Initiated Contact with Patient 01/12/13 1413     Chief Complaint  Patient presents with  . Cough   (Consider location/radiation/quality/duration/timing/severity/associated sxs/prior Treatment) Patient is a 22 y.o. female presenting with cough. The history is provided by the patient. No language interpreter was used.  Cough Cough characteristics:  Productive Sputum characteristics:  Nondescript Severity:  Moderate Onset quality:  Gradual Duration:  2 days Timing:  Constant Progression:  Worsening Chronicity:  New Smoker: no   Relieved by:  Nothing Worsened by:  Nothing tried Ineffective treatments:  None tried Associated symptoms: chest pain and shortness of breath    Pt complains of cough and congestion.   Pt complains of soreness to her chest Past Medical History  Diagnosis Date  . Ovarian cyst     left side   Past Surgical History  Procedure Laterality Date  . Tonsillectomy    . Wisdom tooth extraction    . Dilation and curettage of uterus    . Cesarean section  04/09/2011    Procedure: CESAREAN SECTION;  Surgeon: Loney Laurence;  Location: [facility];  Service: Gynecology;  Laterality: N/A;   Family History  Problem Relation Age of Onset  . Diabetes Mother   . Diabetes Maternal Grandmother   . Diabetes Maternal Grandfather   . Diabetes Paternal Grandmother   . Stroke Paternal Grandmother    History  Substance Use Topics  . Smoking status: Never Smoker   . Smokeless tobacco: Not on file  . Alcohol Use: No   OB History   Grav Para Term Preterm Abortions TAB SAB Ect Mult Living   4 1 0 1 3 1 1 1 0 1      Review of Systems  Respiratory: Positive for cough and shortness of breath.   Cardiovascular: Positive for chest pain.  All other systems reviewed and are negative.    Allergies  Lemon juice; Orange juice; Citrus; Penicillins; Sulfa antibiotics; Amoxicillin; and Latex  Home  Medications   Current Outpatient Rx  Name  Route  Sig  Dispense  Refill  . azithromycin (ZITHROMAX Z-PAK) 250 MG tablet   Oral   Take 2 tablets (500 mg total) by mouth daily.   6 tablet   0   . Loperamide HCl (IMODIUM A-D PO)   Oral   Take 2 tablets by mouth daily as needed. Patient used this medication to help with diarrhea symptoms.          BP 157/99  Pulse 91  Temp(Src) 98.2 F (36.8 C) (Oral)  Resp 17  Ht 5' 7.5" (1.715 m)  Wt 352 lb (159.666 kg)  BMI 54.29 kg/m2  SpO2 100%  LMP 12/29/2012 Physical Exam  Nursing note and vitals reviewed. Constitutional: She is oriented to person, place, and time. She appears well-developed and well-nourished.  HENT:  Head: Normocephalic and atraumatic.  Right Ear: External ear normal.  Left Ear: External ear normal.  Nose: Nose normal.  Mouth/Throat: Oropharynx is clear and moist.  Eyes: Conjunctivae are normal. Pupils are equal, round, and reactive to light.  Neck: Normal range of motion. Neck supple.  Cardiovascular: Normal rate and normal heart sounds.   Pulmonary/Chest: Effort normal and breath sounds normal.  Abdominal: Soft.  Musculoskeletal: Normal range of motion.  Neurological: She is alert and oriented to person, place, and time.  Skin: Skin is warm.  Psychiatric: She has a normal mood  and affect.    ED Course   Procedures (including critical care time)  Labs Reviewed - No data to display Dg Chest 2 View  01/12/2013   *RADIOLOGY REPORT*  Clinical Data: Cough  CHEST - 2 VIEW  Comparison: None.  Findings:  There is a focal area of consolidation in the left lower lobe.  The lungs are otherwise clear.  Heart size and pulmonary vascularity are normal.  No adenopathy.  No bone lesions.  IMPRESSION: Focal area of consolidation and posterior segment left lower lobe. Lungs elsewhere are clear.   Original Report Authenticated By: Bretta Bang, M.D.   1. Community acquired pneumonia     MDM   Date: 01/12/2013  Rate:  86  Rhythm: normal sinus rhythm  QRS Axis: normal  Intervals: normal  ST/T Wave abnormalities: normal  Conduction Disutrbances:none  Narrative Interpretation:   Old EKG Reviewed: none available Pt's chest xray shows left lower consilidation.    Pt given rx for zithromax and hydrocodone  Elson Areas, PA-C 01/12/13 1551  Lonia Skinner Char[place], [place] 01/12/13 1554

## 2013-01-12 NOTE — ED Provider Notes (Signed)
History/physical exam/procedure(s) were performed by non-physician practitioner and as supervising physician I was immediately available for consultation/collaboration. I have reviewed all notes and am in agreement with care and plan.   Auden Tatar S Yicel Shannon, MD 01/12/13 1601 

## 2013-01-12 NOTE — ED Notes (Signed)
Pt states she has had a cough for a couple of days and pain with inspiration no other symptoms

## 2013-03-05 ENCOUNTER — Encounter (HOSPITAL_BASED_OUTPATIENT_CLINIC_OR_DEPARTMENT_OTHER): Payer: Self-pay

## 2013-03-05 ENCOUNTER — Emergency Department (HOSPITAL_BASED_OUTPATIENT_CLINIC_OR_DEPARTMENT_OTHER)
Admission: EM | Admit: 2013-03-05 | Discharge: 2013-03-05 | Disposition: A | Discharge status: Home / Self Care | Payer: Medicaid Other | Attending: Emergency Medicine | Admitting: Emergency Medicine

## 2013-03-05 DIAGNOSIS — Z8742 Personal history of other diseases of the female genital tract: Secondary | ICD-10-CM | POA: Insufficient documentation

## 2013-03-05 DIAGNOSIS — R252 Cramp and spasm: Secondary | ICD-10-CM

## 2013-03-05 DIAGNOSIS — Z792 Long term (current) use of antibiotics: Secondary | ICD-10-CM | POA: Insufficient documentation

## 2013-03-05 DIAGNOSIS — Z880 Allergy status to penicillin: Secondary | ICD-10-CM | POA: Insufficient documentation

## 2013-03-05 DIAGNOSIS — Z91040 Latex allergy status: Secondary | ICD-10-CM | POA: Insufficient documentation

## 2013-03-05 DIAGNOSIS — IMO0001 Reserved for inherently not codable concepts without codable children: Secondary | ICD-10-CM | POA: Insufficient documentation

## 2013-03-05 LAB — BASIC METABOLIC PANEL
CO2: 27 mEq/L (ref 19–32)
Chloride: 99 mEq/L (ref 96–112)
Creatinine, Ser: 0.8 mg/dL (ref 0.50–1.10)
GFR calc Af Amer: >90 mL/min (ref >90)
Sodium: 137 mEq/L (ref 135–145)

## 2013-03-05 LAB — CBC
MCV: 68.3 fL — ABNORMAL LOW (ref 78.0–100.0)
Platelets: 294 10*3/uL (ref 150–400)
RBC: 4.67 MIL/uL (ref 3.87–5.11)
RDW: 15.7 % — ABNORMAL HIGH (ref 11.5–15.5)
WBC: 10.5 10*3/uL (ref 4.0–10.5)

## 2013-03-05 LAB — CK: Total CK: 199 U/L — ABNORMAL HIGH (ref 7–177)

## 2013-03-05 MED ORDER — CYCLOBENZAPRINE HCL 10 MG PO TABS: 10.0000 mg | ORAL_TABLET | Freq: Two times a day (BID) | ORAL | Status: DC | PRN

## 2013-03-05 NOTE — ED Notes (Signed)
Pt with steady gait in to and out of triage

## 2013-03-05 NOTE — ED Provider Notes (Signed)
CSN: [id number]     Arrival date & time 03/05/13  2034 History  This chart was scribed for Gilda Crease, * by Clydene Laming, ED Scribe. This patient was seen in room MH06/MH06 and the patient's care was started at 10:17 PM.   Chief Complaint  Patient presents with  . Leg Pain    The history is provided by the patient. No language interpreter was used.    HPI Comments: Kimberly [name] is a 22 y.o. female who presents to the Emergency Department complaining of constant leg pain in both legs onset in June of this year. Pt states the pain leads to repeated falls to the ground. Pt has a hx of orthoscopics surgery. Pt takes medications for pain without relief. She has not been seen for this issue previously. Pt denies a hx of diabetes and knees "giving out". Pt has not fallen today.  Past Medical History  Diagnosis Date  . Ovarian cyst     left side   Past Surgical History  Procedure Laterality Date  . Tonsillectomy    . Wisdom tooth extraction    . Dilation and curettage of uterus    . Cesarean section  04/09/2011    Procedure: CESAREAN SECTION;  Surgeon: Loney Laurence;  Location: [facility];  Service: Gynecology;  Laterality: N/A;  . Knee arthroscopy     Family History  Problem Relation Age of Onset  . Diabetes Mother   . Diabetes Maternal Grandmother   . Diabetes Maternal Grandfather   . Diabetes Paternal Grandmother   . Stroke Paternal Grandmother    History  Substance Use Topics  . Smoking status: Never Smoker   . Smokeless tobacco: Not on file  . Alcohol Use: No   OB History   Grav Para Term Preterm Abortions TAB SAB Ect Mult Living   4 1 0 1 3 1 1 1 0 1      Review of Systems  Constitutional: Negative for fever and chills.  Musculoskeletal: Negative for back pain and joint swelling.       Leg pain of both legs.   All other systems reviewed and are negative.    Allergies  Lemon juice; Orange juice; Citrus; Penicillins; Sulfa antibiotics; Amoxicillin;  and Latex  Home Medications   Current Outpatient Rx  Name  Route  Sig  Dispense  Refill  . azithromycin (ZITHROMAX Z-PAK) 250 MG tablet   Oral   Take 2 tablets (500 mg total) by mouth daily.   6 tablet   0   . azithromycin (ZITHROMAX) 250 MG tablet   Oral   Take 1 tablet (250 mg total) by mouth daily. Take first 2 tablets together, then 1 every day until finished.   6 tablet   0   . HYDROcodone-acetaminophen (NORCO/VICODIN) 5-325 MG per tablet   Oral   Take 2 tablets by mouth every 4 (four) hours as needed.   20 tablet   0   . Loperamide HCl (IMODIUM A-D PO)   Oral   Take 2 tablets by mouth daily as needed. Patient used this medication to help with diarrhea symptoms.          Triage Vitals:BP 148/81  Pulse 91  Temp(Src) 98.6 F (37 C) (Oral)  Resp 18  Ht 5' 7.5" (1.715 m)  Wt 353 lb (160.12 kg)  BMI 54.44 kg/m2  SpO2 100%  LMP 02/22/2013  Breastfeeding? No Physical Exam  Constitutional: She is oriented to person, place, and time. She appears  well-developed and well-nourished. No distress.  HENT:  Head: Normocephalic and atraumatic.  Right Ear: Hearing normal.  Left Ear: Hearing normal.  Nose: Nose normal.  Mouth/Throat: Oropharynx is clear and moist and mucous membranes are normal.  Eyes: Conjunctivae and EOM are normal. Pupils are equal, round, and reactive to light.  Neck: Normal range of motion. Neck supple.  Cardiovascular: Regular rhythm, S1 normal and S2 normal.  Exam reveals no gallop and no friction rub.   No murmur heard. Pulmonary/Chest: Effort normal and breath sounds normal. No respiratory distress. She exhibits no tenderness.  Abdominal: Soft. Normal appearance and bowel sounds are normal. There is no hepatosplenomegaly. There is no tenderness. There is no rebound, no guarding, no tenderness at McBurney's point and negative Murphy's sign. No hernia.  Musculoskeletal: Normal range of motion.  Neurological: She is alert and oriented to person,  place, and time. She has normal strength. No cranial nerve deficit or sensory deficit. Coordination normal. GCS eye subscore is 4. GCS verbal subscore is 5. GCS motor subscore is 6.  Skin: Skin is warm, dry and intact. No rash noted. No cyanosis.  Psychiatric: She has a normal mood and affect. Her speech is normal and behavior is normal. Thought content normal.    ED Course  Procedures (including critical care time)  DIAGNOSTIC STUDIES: Oxygen Saturation is 100% on RA, normal by my interpretation.    COORDINATION OF CARE: 10:19 PM- Discussed treatment plan with pt at bedside. Pt verbalized understanding and agreement with plan.   Labs Review Labs Reviewed - No data to display Imaging Review No results found.  MDM  Diagnosis: Muscle cramps  Patient presents to the with complaints of pain in the muscles of her thighs and around the knee area for several months. The patient reports that she intermittently has her legs give out on her because of the symptoms, causing falls. She has not fallen today. Patient's examination reveals diffuse soft tissue tenderness in the thigh and calf area bilaterally without any asymmetric swelling, venous cords. Negative Homans sign. No concern for DVT based on the patient's examination and presentation. Blood work was performed to rule out inflammatory process, likely abnormality, etc. CPK is normal, CBC, hemoglobin unremarkable. Electrolytes also unremarkable, normal renal function. Patient reassured, no specific intervention necessary at this time. [facility]   Gilda Crease, MD 03/05/13 2325

## 2013-03-05 NOTE — ED Notes (Signed)
C/o bilat leg pain since June-reports legs "keep giving out and I fall"

## 2013-04-09 ENCOUNTER — Encounter (HOSPITAL_BASED_OUTPATIENT_CLINIC_OR_DEPARTMENT_OTHER): Payer: Self-pay | Admitting: Emergency Medicine

## 2013-04-09 ENCOUNTER — Emergency Department (HOSPITAL_BASED_OUTPATIENT_CLINIC_OR_DEPARTMENT_OTHER)
Admission: EM | Admit: 2013-04-09 | Discharge: 2013-04-09 | Disposition: A | Discharge status: Home / Self Care | Payer: Medicaid Other | Attending: Emergency Medicine | Admitting: Emergency Medicine

## 2013-04-09 DIAGNOSIS — Z880 Allergy status to penicillin: Secondary | ICD-10-CM | POA: Insufficient documentation

## 2013-04-09 DIAGNOSIS — Z8742 Personal history of other diseases of the female genital tract: Secondary | ICD-10-CM | POA: Insufficient documentation

## 2013-04-09 DIAGNOSIS — Z91040 Latex allergy status: Secondary | ICD-10-CM | POA: Insufficient documentation

## 2013-04-09 DIAGNOSIS — L0291 Cutaneous abscess, unspecified: Secondary | ICD-10-CM

## 2013-04-09 DIAGNOSIS — N61 Mastitis without abscess: Secondary | ICD-10-CM | POA: Insufficient documentation

## 2013-04-09 DIAGNOSIS — J029 Acute pharyngitis, unspecified: Secondary | ICD-10-CM | POA: Insufficient documentation

## 2013-04-09 NOTE — ED Provider Notes (Signed)
CSN: [id number]     Arrival date & time 04/09/13  1613 History   First MD Initiated Contact with Patient 04/09/13 1659     Chief Complaint  Patient presents with  . Abscess   (Consider location/radiation/quality/duration/timing/severity/associated sxs/prior Treatment) HPI Comments: Patient is a 22 year old female who presents to the emergency department complaining of an abscess to her left breast that has been present for the past 2 weeks. Patient states it initially started off small and has increased in size with purulent drainage when she squeezes it. Over the past few days he developed a crust on top. She is also complaining of a sore throat x2 days. Admits to associated bilateral ear pain. Denies fever, chills, cough, congestion, abdominal pain, nausea, vomiting or diarrhea. No sick contacts. She has tried taking over-the-counter Delsym without relief.  Patient is a 22 y.o. female presenting with abscess. The history is provided by the patient.  Abscess   Past Medical History  Diagnosis Date  . Ovarian cyst     left side   Past Surgical History  Procedure Laterality Date  . Tonsillectomy    . Wisdom tooth extraction    . Dilation and curettage of uterus    . Cesarean section  04/09/2011    Procedure: CESAREAN SECTION;  Surgeon: Loney Laurence;  Location: [facility];  Service: Gynecology;  Laterality: N/A;  . Knee arthroscopy     Family History  Problem Relation Age of Onset  . Diabetes Mother   . Diabetes Maternal Grandmother   . Diabetes Maternal Grandfather   . Diabetes Paternal Grandmother   . Stroke Paternal Grandmother    History  Substance Use Topics  . Smoking status: Never Smoker   . Smokeless tobacco: Not on file  . Alcohol Use: No   OB History   Grav Para Term Preterm Abortions TAB SAB Ect Mult Living   4 1 0 1 3 1 1 1 0 1      Review of Systems  HENT: Positive for sore throat.   Skin:       Positive for abscess.  All other systems reviewed and are  negative.    Allergies  Lemon juice; Orange juice; Citrus; Penicillins; Sulfa antibiotics; Amoxicillin; and Latex  Home Medications   Current Outpatient Rx  Name  Route  Sig  Dispense  Refill  . azithromycin (ZITHROMAX Z-PAK) 250 MG tablet   Oral   Take 2 tablets (500 mg total) by mouth daily.   6 tablet   0   . azithromycin (ZITHROMAX) 250 MG tablet   Oral   Take 1 tablet (250 mg total) by mouth daily. Take first 2 tablets together, then 1 every day until finished.   6 tablet   0   . cyclobenzaprine (FLEXERIL) 10 MG tablet   Oral   Take 1 tablet (10 mg total) by mouth 2 (two) times daily as needed for muscle spasms.   20 tablet   0   . HYDROcodone-acetaminophen (NORCO/VICODIN) 5-325 MG per tablet   Oral   Take 2 tablets by mouth every 4 (four) hours as needed.   20 tablet   0   . Loperamide HCl (IMODIUM A-D PO)   Oral   Take 2 tablets by mouth daily as needed. Patient used this medication to help with diarrhea symptoms.          BP 158/87  Temp(Src) 98.4 F (36.9 C) (Oral)  Resp 20  SpO2 98%  LMP 03/23/2013 Physical Exam  Nursing note and vitals reviewed. Constitutional: She is oriented to person, place, and time. She appears well-developed and well-nourished. No distress.  HENT:  Head: Normocephalic and atraumatic.  Right Ear: Tympanic membrane and ear canal normal.  Left Ear: Tympanic membrane and ear canal normal.  Nose: Mucosal edema present.  Mouth/Throat: Posterior oropharyngeal erythema present. No oropharyngeal exudate or posterior oropharyngeal edema.  Eyes: Conjunctivae are normal.  Neck: Normal range of motion. Neck supple.  Cardiovascular: Normal rate, regular rhythm and normal heart sounds.   Pulmonary/Chest: Effort normal and breath sounds normal.  Musculoskeletal: Normal range of motion. She exhibits no edema.  Lymphadenopathy:    She has no cervical adenopathy.  Neurological: She is alert and oriented to person, place, and time.  Skin:  Skin is warm and dry. She is not diaphoretic.  1 cm diameter abscess around 10:00 on the left breast with fluctuance and small amount of purulent drainage, no induration.  Psychiatric: She has a normal mood and affect. Her behavior is normal.    ED Course  Procedures (including critical care time) INCISION AND DRAINAGE Performed by: Johnnette Gourd Consent: Verbal consent obtained. Risks and benefits: risks, benefits and alternatives were discussed Type: abscess  Body area: left breast  Anesthesia: local infiltration  Incision was made with a scalpel.  Local anesthetic: lidocaine 2% with epinephrine  Anesthetic total: 3 ml  Complexity: simple  Drainage: purulent  Drainage amount: small  Packing material: none  Patient tolerance: Patient tolerated the procedure well with no immediate complications.    Labs Review Labs Reviewed - No data to display Imaging Review No results found.  EKG Interpretation   None       MDM   1. Abscess   2. Viral pharyngitis    Patient with abscess and viral pharyngitis. Abscess drained was a small amount of purulent material expressed. She is afebrile and in no apparent distress. Regarding her sore throat, her pharynx is erythematous without edema or exudate. She is congested. Conservative measures discussed. Return precautions discussed. Patient states understanding of treatment care plan and is agreeable.     Trevor Mace, PA-C 04/09/13 1723

## 2013-04-09 NOTE — ED Notes (Signed)
Patient states she has an abscess on her left breast for the last three weeks.  States the abscess crust over and has a pus draining from the center.  States she also has a sore throat.

## 2013-04-10 NOTE — ED Provider Notes (Signed)
Medical screening examination/treatment/procedure(s) were performed by non-physician practitioner and as supervising physician I was immediately available for consultation/collaboration.  EKG Interpretation   None         Gwyneth Sprout, MD 04/10/13 [id number]

## 2013-06-30 ENCOUNTER — Encounter (HOSPITAL_COMMUNITY): Payer: Self-pay | Admitting: Emergency Medicine

## 2013-06-30 ENCOUNTER — Emergency Department (HOSPITAL_COMMUNITY)
Admission: EM | Admit: 2013-06-30 | Discharge: 2013-07-01 | Disposition: A | Discharge status: Home / Self Care | Payer: Self-pay | Attending: Emergency Medicine | Admitting: Emergency Medicine

## 2013-06-30 ENCOUNTER — Emergency Department (HOSPITAL_COMMUNITY): Payer: Self-pay

## 2013-06-30 DIAGNOSIS — Z91040 Latex allergy status: Secondary | ICD-10-CM | POA: Insufficient documentation

## 2013-06-30 DIAGNOSIS — R599 Enlarged lymph nodes, unspecified: Secondary | ICD-10-CM | POA: Insufficient documentation

## 2013-06-30 DIAGNOSIS — Z880 Allergy status to penicillin: Secondary | ICD-10-CM | POA: Insufficient documentation

## 2013-06-30 DIAGNOSIS — M79673 Pain in unspecified foot: Secondary | ICD-10-CM

## 2013-06-30 DIAGNOSIS — I10 Essential (primary) hypertension: Secondary | ICD-10-CM | POA: Insufficient documentation

## 2013-06-30 DIAGNOSIS — R591 Generalized enlarged lymph nodes: Secondary | ICD-10-CM

## 2013-06-30 DIAGNOSIS — M25579 Pain in unspecified ankle and joints of unspecified foot: Secondary | ICD-10-CM | POA: Insufficient documentation

## 2013-06-30 HISTORY — DX: Essential (primary) hypertension: I10

## 2013-06-30 NOTE — ED Notes (Signed)
Pt states she is having difficulty with her left foot  Pt states she cannot describe it but is feels like it is not working properly  Pt states she also has a knot in her neck on the right side  Pt states the knot in her neck is painful

## 2013-07-01 NOTE — ED Provider Notes (Signed)
CSN: [id number]     Arrival date & time 06/30/13  2216 History   First MD Initiated Contact with Patient 06/30/13 2306     Chief Complaint  Patient presents with  . Foot Pain  . Neck Pain   (Consider location/radiation/quality/duration/timing/severity/associated sxs/prior Treatment) HPI Comments: Patient presents emergency department with chief complaint of left foot pain. She states that his been ongoing for the past several months. Denies any known mechanism of injury. She states that nothing changed tonight, she just wanted to get it looked at. Additionally, she states that she has noticed a lump on her neck for the past week. It is mildly tender to palpation. She denies any fevers, chills, nausea, vomiting, diarrhea, or constipation. She has not tried anything to alleviate her symptoms. The pain is worsened with walking.  The history is provided by the patient. No language interpreter was used.    Past Medical History  Diagnosis Date  . Ovarian cyst     left side  . Hypertension    Past Surgical History  Procedure Laterality Date  . Tonsillectomy    . Wisdom tooth extraction    . Dilation and curettage of uterus    . Cesarean section  04/09/2011    Procedure: CESAREAN SECTION;  Surgeon: Loney LaurenceMichelle A Horvath;  Location: [facility];  Service: Gynecology;  Laterality: N/A;  . Knee arthroscopy     Family History  Problem Relation Age of Onset  . Diabetes Mother   . Diabetes Maternal Grandmother   . Diabetes Maternal Grandfather   . Diabetes Paternal Grandmother   . Stroke Paternal Grandmother    History  Substance Use Topics  . Smoking status: Never Smoker   . Smokeless tobacco: Not on file  . Alcohol Use: No   OB History   Grav Para Term Preterm Abortions TAB SAB Ect Mult Living   4 1 0 1 3 1 1 1 0 1      Review of Systems  All other systems reviewed and are negative.    Allergies  Lemon juice; Orange juice; Citrus; Penicillins; Sulfa antibiotics; Amoxicillin; and  Latex  Home Medications   Current Outpatient Rx  Name  Route  Sig  Dispense  Refill  . aspirin-acetaminophen-caffeine (EXCEDRIN MIGRAINE) 250-250-65 MG per tablet   Oral   Take 1 tablet by mouth every 6 (six) hours as needed for headache (pain).          BP 141/81  Pulse 87  Temp(Src) 98.5 F (36.9 C) (Oral)  Resp 20  SpO2 100%  LMP 06/16/2013 Physical Exam  Nursing note and vitals reviewed. Constitutional: She is oriented to person, place, and time. She appears well-developed and well-nourished.  HENT:  Head: Normocephalic and atraumatic.  Eyes: Conjunctivae and EOM are normal.  Neck: Normal range of motion.  Right-sided suboccipital lymph node mildly tender to palpation, no surrounding erythema, or cellulitis, no evidence of abscess  Cardiovascular: Normal rate.   Pulmonary/Chest: Effort normal.  Abdominal: She exhibits no distension.  Musculoskeletal: Normal range of motion.  Left foot moderately tender to palpation of the midfoot, no bony abnormality or deformity, no erythema, no evidence of cellulitis, range of motion and strength 5/5  Neurological: She is alert and oriented to person, place, and time.  Skin: Skin is dry.  Psychiatric: She has a normal mood and affect. Her behavior is normal. Judgment and thought content normal.    ED Course  Procedures (including critical care time) Labs Review Labs Reviewed - No data  to display Imaging Review Dg Foot Complete Left  07/01/2013   CLINICAL DATA:  Foot pain without known injury  EXAM: LEFT FOOT - COMPLETE 3+ VIEW  COMPARISON:  None.  FINDINGS: There is no evidence of fracture or dislocation. There is no evidence of arthropathy or other focal bone abnormality. Soft tissues are unremarkable.  IMPRESSION: Negative.   Electronically Signed   By: Tiburcio Pea M.D.   On: 07/01/2013 00:38    EKG Interpretation   None       MDM   1. Foot pain   2. Lymphadenopathy     Followup with PCP for followup of lymph  node. Followup with foot specialist for foot pain.    Roxy Horseman, PA-C 07/01/13 [phone]

## 2013-07-01 NOTE — ED Provider Notes (Signed)
Medical screening examination/treatment/procedure(s) were performed by non-physician practitioner and as supervising physician I was immediately available for consultation/collaboration.    Olivia Mackielga M Orlena Garmon, MD 07/01/13 [phone]

## 2013-07-01 NOTE — Discharge Instructions (Signed)
Please follow up with a primary care doctor for the lymph node on the back of your neck.  If you do not have a primary care doctor, see the list below.    Plantar Fasciitis Plantar fasciitis is a common condition that causes foot pain. It is soreness (inflammation) of the band of tough fibrous tissue on the bottom of the foot that runs from the heel bone (calcaneus) to the ball of the foot. The cause of this soreness may be from excessive standing, poor fitting shoes, running on hard surfaces, being overweight, having an abnormal walk, or overuse (this is common in runners) of the painful foot or feet. It is also common in aerobic exercise dancers and ballet dancers. SYMPTOMS  Most people with plantar fasciitis complain of:  Severe pain in the morning on the bottom of their foot especially when taking the first steps out of bed. This pain recedes after a few minutes of walking.  Severe pain is experienced also during walking following a long period of inactivity.  Pain is worse when walking barefoot or up stairs DIAGNOSIS   Your caregiver will diagnose this condition by examining and feeling your foot.  Special tests such as X-rays of your foot, are usually not needed. PREVENTION   Consult a sports medicine professional before beginning a new exercise program.  Walking programs offer a good workout. With walking there is a lower chance of overuse injuries common to runners. There is less impact and less jarring of the joints.  Begin all new exercise programs slowly. If problems or pain develop, decrease the amount of time or distance until you are at a comfortable level.  Wear good shoes and replace them regularly.  Stretch your foot and the heel cords at the back of the ankle (Achilles tendon) both before and after exercise.  Run or exercise on even surfaces that are not hard. For example, asphalt is better than pavement.  Do not run barefoot on hard surfaces.  If using a treadmill,  vary the incline.  Do not continue to workout if you have foot or joint problems. Seek professional help if they do not improve. HOME CARE INSTRUCTIONS   Avoid activities that cause you pain until you recover.  Use ice or cold packs on the problem or painful areas after working out.  Only take over-the-counter or prescription medicines for pain, discomfort, or fever as directed by your caregiver.  Soft shoe inserts or athletic shoes with air or gel sole cushions may be helpful.  If problems continue or become more severe, consult a sports medicine caregiver or your own health care provider. Cortisone is a potent anti-inflammatory medication that may be injected into the painful area. You can discuss this treatment with your caregiver. MAKE SURE YOU:   Understand these instructions.  Will watch your condition.  Will get help right away if you are not doing well or get worse. Document Released: 02/26/2001 Document Revised: 08/26/2011 Document Reviewed: 04/27/2008 [facility] Patient Information 2014 [place], [place].

## 2013-07-23 ENCOUNTER — Emergency Department (HOSPITAL_BASED_OUTPATIENT_CLINIC_OR_DEPARTMENT_OTHER)
Admission: EM | Admit: 2013-07-23 | Discharge: 2013-07-23 | Disposition: A | Discharge status: Home / Self Care | Payer: Self-pay | Attending: Emergency Medicine | Admitting: Emergency Medicine

## 2013-07-23 ENCOUNTER — Encounter (HOSPITAL_BASED_OUTPATIENT_CLINIC_OR_DEPARTMENT_OTHER): Payer: Self-pay | Admitting: Emergency Medicine

## 2013-07-23 DIAGNOSIS — Z8742 Personal history of other diseases of the female genital tract: Secondary | ICD-10-CM | POA: Insufficient documentation

## 2013-07-23 DIAGNOSIS — H9209 Otalgia, unspecified ear: Secondary | ICD-10-CM | POA: Insufficient documentation

## 2013-07-23 DIAGNOSIS — R0982 Postnasal drip: Secondary | ICD-10-CM | POA: Insufficient documentation

## 2013-07-23 DIAGNOSIS — Z880 Allergy status to penicillin: Secondary | ICD-10-CM | POA: Insufficient documentation

## 2013-07-23 DIAGNOSIS — J069 Acute upper respiratory infection, unspecified: Secondary | ICD-10-CM | POA: Insufficient documentation

## 2013-07-23 DIAGNOSIS — Z91040 Latex allergy status: Secondary | ICD-10-CM | POA: Insufficient documentation

## 2013-07-23 DIAGNOSIS — J029 Acute pharyngitis, unspecified: Secondary | ICD-10-CM | POA: Insufficient documentation

## 2013-07-23 DIAGNOSIS — B9789 Other viral agents as the cause of diseases classified elsewhere: Secondary | ICD-10-CM

## 2013-07-23 DIAGNOSIS — I10 Essential (primary) hypertension: Secondary | ICD-10-CM | POA: Insufficient documentation

## 2013-07-23 LAB — RAPID STREP SCREEN (MED CTR MEBANE ONLY): Streptococcus, Group A Screen (Direct): NEGATIVE

## 2013-07-23 MED ORDER — DEXAMETHASONE 4 MG PO TABS
ORAL_TABLET | ORAL | Status: AC
  Filled 2013-07-23: qty 2

## 2013-07-23 MED ORDER — DEXAMETHASONE 4 MG PO TABS
6.0000 mg | ORAL_TABLET | Freq: Once | ORAL | Status: AC
  Administered 2013-07-23: 6 mg via ORAL

## 2013-07-23 NOTE — ED Provider Notes (Signed)
CSN: [id number]     Arrival date & time 07/23/13  2109 History  This chart was scribed for Shanna Cisco, MD by Nicholos Johns, ED scribe. This patient was seen in room MH05/MH05 and the patient's care was started at 10:41 PM.   Chief Complaint  Patient presents with  . Sore Throat   Patient is a 23 y.o. female presenting with pharyngitis. The history is provided by the patient. No language interpreter was used.  Sore Throat This is a new problem. The current episode started more than 2 days ago. The problem has been gradually worsening. Pertinent negatives include no chest pain, no abdominal pain, no headaches and no shortness of breath. She has tried nothing for the symptoms.  HPI Comments: Kimberly [name] is a 23 y.o. female who presents to the Emergency Department complaining of sore throat, rhinorrhea, cough, fever, bilateral ear pain, onset 3 days ago. States she feels like her throat is on fire. When it first started, states it felt like there was a frog in it. She feels like she cannot stay hydrated, but reports eating & drinking normally with nml UOP.  Pt has had tonsils removed. Pt has sick contact at home.  Pt works at [employer]. Pt has HTN but is not on any medication.  Denies body aches, trouble swallowing.   Past Medical History  Diagnosis Date  . Ovarian cyst     left side  . Hypertension    Past Surgical History  Procedure Laterality Date  . Tonsillectomy    . Wisdom tooth extraction    . Dilation and curettage of uterus    . Cesarean section  04/09/2011    Procedure: CESAREAN SECTION;  Surgeon: Loney Laurence;  Location: [facility];  Service: Gynecology;  Laterality: N/A;  . Knee arthroscopy     Family History  Problem Relation Age of Onset  . Diabetes Mother   . Diabetes Maternal Grandmother   . Diabetes Maternal Grandfather   . Diabetes Paternal Grandmother   . Stroke Paternal Grandmother    History  Substance Use Topics  . Smoking status: Never Smoker    . Smokeless tobacco: Not on file  . Alcohol Use: No   OB History   Grav Para Term Preterm Abortions TAB SAB Ect Mult Living   4 1 0 1 3 1 1 1 0 1      Review of Systems  Constitutional: Positive for fever. Negative for chills, diaphoresis, activity change, appetite change and fatigue.  HENT: Positive for ear pain, postnasal drip, rhinorrhea and sore throat. Negative for congestion and facial swelling.   Eyes: Negative for photophobia and discharge.  Respiratory: Positive for cough. Negative for chest tightness and shortness of breath.   Cardiovascular: Negative for chest pain, palpitations and leg swelling.  Gastrointestinal: Negative for nausea, vomiting, abdominal pain and diarrhea.  Endocrine: Negative for polydipsia and polyuria.  Genitourinary: Negative for dysuria, frequency, difficulty urinating and pelvic pain.  Musculoskeletal: Negative for arthralgias, back pain, myalgias, neck pain and neck stiffness.  Skin: Negative for color change and wound.  Allergic/Immunologic: Negative for immunocompromised state.  Neurological: Negative for facial asymmetry, weakness, numbness and headaches.  Hematological: Does not bruise/bleed easily.  Psychiatric/Behavioral: Negative for confusion and agitation.    Allergies  Lemon juice; Orange juice; Citrus; Penicillins; Sulfa antibiotics; Amoxicillin; and Latex  Home Medications   Current Outpatient Rx  Name  Route  Sig  Dispense  Refill  . aspirin-acetaminophen-caffeine (EXCEDRIN MIGRAINE) 250-250-65 MG per tablet  Oral   Take 1 tablet by mouth every 6 (six) hours as needed for headache (pain).          Triage Vitals: BP 133/73  Pulse 93  Temp(Src) 98.4 F (36.9 C) (Oral)  Resp 22  Ht 5' 7.5" (1.715 m)  Wt 354 lb (160.573 kg)  BMI 54.59 kg/m2  SpO2 100%  LMP 06/30/2013 Physical Exam  Nursing note and vitals reviewed. Constitutional: She is oriented to person, place, and time. She appears well-developed and well-nourished.  No distress.  HENT:  Head: Normocephalic.  Right Ear: External ear normal.  Left Ear: External ear normal.  Mouth/Throat: Posterior oropharyngeal erythema present.  Eyes: Pupils are equal, round, and reactive to light.  Neck: Neck supple.  Cardiovascular: Normal rate, regular rhythm and normal heart sounds.   Pulmonary/Chest: Effort normal and breath sounds normal. No respiratory distress. She has no wheezes.  Abdominal: Soft. She exhibits no distension. There is no tenderness. There is no rebound and no guarding.  Musculoskeletal: She exhibits no edema and no tenderness.  Neurological: She is alert and oriented to person, place, and time.  Skin: Skin is warm and dry.  Psychiatric: She has a normal mood and affect.    ED Course  Procedures  DIAGNOSTIC STUDIES: Oxygen Saturation is 100% on room air, normal by my interpretation.    COORDINATION OF CARE: At 10:45 PM: Discussed treatment plan with patient which includes treatment of symptoms. Patient agrees.   Results for orders placed during the hospital encounter of 07/23/13  RAPID STREP SCREEN      Result Value Range   Streptococcus, Group A Screen (Direct) NEGATIVE  NEGATIVE   Labs Review Labs Reviewed  RAPID STREP SCREEN  CULTURE, GROUP A STREP   Imaging Review No results found.  EKG Interpretation   None       MDM   1. Viral URI with cough   2. Viral pharyngitis    Pt is a 23 y.o. female with Pmhx as above who presents with 2 days of sore throat.  Rapid strep negative. Pt well appearing on exam.  Do not suspect PTA or RPA.  Pt likely has viral pharyngitis. 1 dose PO decadron given.  Rec supportive treatment w/ tylenol/ibuprofen, warm fluids.  Return precautions given for new or worsening symptoms including trouble swallowing or tolerating liquids.       I personally performed the services described in this documentation, which was scribed in my presence. The recorded information has been reviewed and is  accurate.      Shanna CiscoMegan E Tyeesha Riker, MD 07/24/13 1106

## 2013-07-23 NOTE — ED Notes (Signed)
Sore throat x 3 days

## 2013-07-23 NOTE — Discharge Instructions (Signed)
Viral Pharyngitis Viral pharyngitis is a viral infection that produces redness, pain, and swelling (inflammation) of the throat. It can spread from person to person (contagious). CAUSES Viral pharyngitis is caused by inhaling a large amount of certain germs called viruses. Many different viruses cause viral pharyngitis. SYMPTOMS Symptoms of viral pharyngitis include:  Sore throat.  Tiredness.  Stuffy nose.  Low-grade fever.  Congestion.  Cough. TREATMENT Treatment includes rest, drinking plenty of fluids, and the use of over-the-counter medication (approved by your caregiver). HOME CARE INSTRUCTIONS   Drink enough fluids to keep your urine clear or pale yellow.  Eat soft, cold foods such as ice cream, frozen ice pops, or gelatin dessert.  Gargle with warm salt water (1 tsp salt per 1 qt of water).  If over age 7, throat lozenges may be used safely.  Only take over-the-counter or prescription medicines for pain, discomfort, or fever as directed by your caregiver. Do not take aspirin. To help prevent spreading viral pharyngitis to others, avoid:  Mouth-to-mouth contact with others.  Sharing utensils for eating and drinking.  Coughing around others. SEEK MEDICAL CARE IF:   You are better in a few days, then become worse.  You have a fever or pain not helped by pain medicines.  There are any other changes that concern you. Document Released: 03/13/2005 Document Revised: 08/26/2011 Document Reviewed: 08/09/2010 ExitCare Patient Information 2014 [employer].  

## 2013-07-23 NOTE — ED Notes (Signed)
MD at bedside. 

## 2013-07-25 LAB — CULTURE, GROUP A STREP

## 2013-08-14 ENCOUNTER — Emergency Department (HOSPITAL_BASED_OUTPATIENT_CLINIC_OR_DEPARTMENT_OTHER)
Admission: EM | Admit: 2013-08-14 | Discharge: 2013-08-14 | Disposition: A | Discharge status: Home / Self Care | Payer: Self-pay | Attending: Emergency Medicine | Admitting: Emergency Medicine

## 2013-08-14 ENCOUNTER — Encounter (HOSPITAL_BASED_OUTPATIENT_CLINIC_OR_DEPARTMENT_OTHER): Payer: Self-pay | Admitting: Emergency Medicine

## 2013-08-14 DIAGNOSIS — R109 Unspecified abdominal pain: Secondary | ICD-10-CM | POA: Insufficient documentation

## 2013-08-14 DIAGNOSIS — Z91040 Latex allergy status: Secondary | ICD-10-CM | POA: Insufficient documentation

## 2013-08-14 DIAGNOSIS — R197 Diarrhea, unspecified: Secondary | ICD-10-CM | POA: Insufficient documentation

## 2013-08-14 DIAGNOSIS — I10 Essential (primary) hypertension: Secondary | ICD-10-CM | POA: Insufficient documentation

## 2013-08-14 DIAGNOSIS — IMO0001 Reserved for inherently not codable concepts without codable children: Secondary | ICD-10-CM | POA: Insufficient documentation

## 2013-08-14 DIAGNOSIS — Z8742 Personal history of other diseases of the female genital tract: Secondary | ICD-10-CM | POA: Insufficient documentation

## 2013-08-14 DIAGNOSIS — Z3202 Encounter for pregnancy test, result negative: Secondary | ICD-10-CM | POA: Insufficient documentation

## 2013-08-14 DIAGNOSIS — Z880 Allergy status to penicillin: Secondary | ICD-10-CM | POA: Insufficient documentation

## 2013-08-14 DIAGNOSIS — R1110 Vomiting, unspecified: Secondary | ICD-10-CM | POA: Insufficient documentation

## 2013-08-14 LAB — CBC WITH DIFFERENTIAL/PLATELET
Basophils Absolute: 0 10*3/uL (ref 0.0–0.1)
Basophils Relative: 0 % (ref 0–1)
EOS PCT: 2 % (ref 0–5)
Eosinophils Absolute: 0.1 10*3/uL (ref 0.0–0.7)
HCT: 34.1 % — ABNORMAL LOW (ref 36.0–46.0)
Hemoglobin: 11 g/dL — ABNORMAL LOW (ref 12.0–15.0)
LYMPHS ABS: 0.9 10*3/uL (ref 0.7–4.0)
LYMPHS PCT: 11 % — AB (ref 12–46)
MCH: 22 pg — ABNORMAL LOW (ref 26.0–34.0)
MCHC: 32.3 g/dL (ref 30.0–36.0)
MCV: 68.1 fL — AB (ref 78.0–100.0)
Monocytes Absolute: 0.4 10*3/uL (ref 0.1–1.0)
Monocytes Relative: 5 % (ref 3–12)
NEUTROS ABS: 6.5 10*3/uL (ref 1.7–7.7)
Neutrophils Relative %: 83 % — ABNORMAL HIGH (ref 43–77)
PLATELETS: 315 10*3/uL (ref 150–400)
RBC: 5.01 MIL/uL (ref 3.87–5.11)
RDW: 15.6 % — ABNORMAL HIGH (ref 11.5–15.5)
WBC: 7.9 10*3/uL (ref 4.0–10.5)

## 2013-08-14 LAB — COMPREHENSIVE METABOLIC PANEL
ALBUMIN: 4 g/dL (ref 3.5–5.2)
ALK PHOS: 98 U/L (ref 39–117)
ALT: 12 U/L (ref 0–35)
AST: 14 U/L (ref 0–37)
BILIRUBIN TOTAL: 0.7 mg/dL (ref 0.3–1.2)
BUN: 9 mg/dL (ref 6–23)
CHLORIDE: 99 meq/L (ref 96–112)
CO2: 28 meq/L (ref 19–32)
CREATININE: 0.6 mg/dL (ref 0.50–1.10)
Calcium: 9.4 mg/dL (ref 8.4–10.5)
GFR calc Af Amer: >90 mL/min (ref >90)
Glucose, Bld: 85 mg/dL (ref 70–99)
POTASSIUM: 3.7 meq/L (ref 3.7–5.3)
SODIUM: 138 meq/L (ref 137–147)
Total Protein: 8.4 g/dL — ABNORMAL HIGH (ref 6.0–8.3)

## 2013-08-14 LAB — URINALYSIS, ROUTINE W REFLEX MICROSCOPIC
BILIRUBIN URINE: NEGATIVE
GLUCOSE, UA: NEGATIVE mg/dL
HGB URINE DIPSTICK: NEGATIVE
Ketones, ur: NEGATIVE mg/dL
Leukocytes, UA: NEGATIVE
Nitrite: NEGATIVE
PROTEIN: NEGATIVE mg/dL
Specific Gravity, Urine: 1.02 (ref 1.005–1.030)
UROBILINOGEN UA: 1 mg/dL (ref 0.0–1.0)
pH: 6.5 (ref 5.0–8.0)

## 2013-08-14 LAB — PREGNANCY, URINE: Preg Test, Ur: NEGATIVE

## 2013-08-14 MED ORDER — SODIUM CHLORIDE 0.9 % IV BOLUS (SEPSIS)
1000.0000 mL | Freq: Once | INTRAVENOUS | Status: AC
  Administered 2013-08-14: 1000 mL via INTRAVENOUS

## 2013-08-14 MED ORDER — DIPHENOXYLATE-ATROPINE 2.5-0.025 MG PO TABS
2.0000 | ORAL_TABLET | Freq: Once | ORAL | Status: AC
  Administered 2013-08-14: 2 via ORAL
  Filled 2013-08-14: qty 2

## 2013-08-14 MED ORDER — PROMETHAZINE HCL 25 MG PO TABS: 25.0000 mg | ORAL_TABLET | Freq: Four times a day (QID) | ORAL | Status: DC | PRN

## 2013-08-14 MED ORDER — ONDANSETRON HCL 4 MG/2ML IJ SOLN
4.0000 mg | Freq: Once | INTRAMUSCULAR | Status: AC
  Administered 2013-08-14: 4 mg via INTRAVENOUS
  Filled 2013-08-14: qty 2

## 2013-08-14 MED ORDER — ACETAMINOPHEN 325 MG PO TABS
650.0000 mg | ORAL_TABLET | Freq: Once | ORAL | Status: AC
  Administered 2013-08-14: 650 mg via ORAL
  Filled 2013-08-14: qty 2

## 2013-08-14 MED ORDER — DIPHENOXYLATE-ATROPINE 2.5-0.025 MG PO TABS: 2.0000 | ORAL_TABLET | Freq: Four times a day (QID) | ORAL | Status: DC | PRN

## 2013-08-14 NOTE — Discharge Instructions (Signed)
Diarrhea °Diarrhea is frequent loose and watery bowel movements. It can cause you to feel weak and dehydrated. Dehydration can cause you to become tired and thirsty, have a dry mouth, and have decreased urination that often is dark yellow. Diarrhea is a sign of another problem, most often an infection that will not last long. In most cases, diarrhea typically lasts 2 3 days. However, it can last longer if it is a sign of something more serious. It is important to treat your diarrhea as directed by your caregive to lessen or prevent future episodes of diarrhea. °CAUSES  °Some common causes include: °· Gastrointestinal infections caused by viruses, bacteria, or parasites. °· Food poisoning or food allergies. °· Certain medicines, such as antibiotics, chemotherapy, and laxatives. °· Artificial sweeteners and fructose. °· Digestive disorders. °HOME CARE INSTRUCTIONS °· Ensure adequate fluid intake (hydration): have 1 cup (8 oz) of fluid for each diarrhea episode. Avoid fluids that contain simple sugars or sports drinks, fruit juices, whole milk products, and sodas. Your urine should be clear or pale yellow if you are drinking enough fluids. Hydrate with an oral rehydration solution that you can purchase at pharmacies, retail stores, and online. You can prepare an oral rehydration solution at home by mixing the following ingredients together: °·   tsp table salt. °· ¾ tsp baking soda. °·  tsp salt substitute containing potassium chloride. °· 1  tablespoons sugar. °· 1 L (34 oz) of water. °· Certain foods and beverages may increase the speed at which food moves through the gastrointestinal (GI) tract. These foods and beverages should be avoided and include: °· Caffeinated and alcoholic beverages. °· High-fiber foods, such as raw fruits and vegetables, nuts, seeds, and whole grain breads and cereals. °· Foods and beverages sweetened with sugar alcohols, such as xylitol, sorbitol, and mannitol. °· Some foods may be well  tolerated and may help thicken stool including: °· Starchy foods, such as rice, toast, pasta, low-sugar cereal, oatmeal, grits, baked potatoes, crackers, and bagels. °· Bananas. °· Applesauce. °· Add probiotic-rich foods to help increase healthy bacteria in the GI tract, such as yogurt and fermented milk products. °· Wash your hands well after each diarrhea episode. °· Only take over-the-counter or prescription medicines as directed by your caregiver. °· Take a warm bath to relieve any burning or pain from frequent diarrhea episodes. °SEEK IMMEDIATE MEDICAL CARE IF:  °· You are unable to keep fluids down. °· You have persistent vomiting. °· You have blood in your stool, or your stools are black and tarry. °· You do not urinate in 6 8 hours, or there is only a small amount of very dark urine. °· You have abdominal pain that increases or localizes. °· You have weakness, dizziness, confusion, or lightheadedness. °· You have a severe headache. °· Your diarrhea gets worse or does not get better. °· You have a fever or persistent symptoms for more than 2 3 days. °· You have a fever and your symptoms suddenly get worse. °MAKE SURE YOU:  °· Understand these instructions. °· Will watch your condition. °· Will get help right away if you are not doing well or get worse. °Document Released: 05/24/2002 Document Revised: 05/20/2012 Document Reviewed: 02/09/2012 °ExitCare® Patient Information ©2014 [employer]. ° °Nausea and Vomiting °Nausea is a sick feeling that often comes before throwing up (vomiting). Vomiting is a reflex where stomach contents come out of your mouth. Vomiting can cause severe loss of body fluids (dehydration). Children and elderly adults can become   dehydrated quickly, especially if they also have diarrhea. Nausea and vomiting are symptoms of a condition or disease. It is important to find the cause of your symptoms. °CAUSES  °· Direct irritation of the stomach lining. This irritation can result from  increased acid production (gastroesophageal reflux disease), infection, food poisoning, taking certain medicines (such as nonsteroidal anti-inflammatory drugs), alcohol use, or tobacco use. °· Signals from the brain. These signals could be caused by a headache, heat exposure, an inner ear disturbance, increased pressure in the brain from injury, infection, a tumor, or a concussion, pain, emotional stimulus, or metabolic problems. °· An obstruction in the gastrointestinal tract (bowel obstruction). °· Illnesses such as diabetes, hepatitis, gallbladder problems, appendicitis, kidney problems, cancer, sepsis, atypical symptoms of a heart attack, or eating disorders. °· Medical treatments such as chemotherapy and radiation. °· Receiving medicine that makes you sleep (general anesthetic) during surgery. °DIAGNOSIS °Your caregiver may ask for tests to be done if the problems do not improve after a few days. Tests may also be done if symptoms are severe or if the reason for the nausea and vomiting is not clear. Tests may include: °· Urine tests. °· Blood tests. °· Stool tests. °· Cultures (to look for evidence of infection). °· X-rays or other imaging studies. °Test results can help your caregiver make decisions about treatment or the need for additional tests. °TREATMENT °You need to stay well hydrated. Drink frequently but in small amounts. You may wish to drink water, sports drinks, clear broth, or eat frozen ice pops or gelatin dessert to help stay hydrated. When you eat, eating slowly may help prevent nausea. There are also some antinausea medicines that may help prevent nausea. °HOME CARE INSTRUCTIONS  °· Take all medicine as directed by your caregiver. °· If you do not have an appetite, do not force yourself to eat. However, you must continue to drink fluids. °· If you have an appetite, eat a normal diet unless your caregiver tells you differently. °· Eat a variety of complex carbohydrates (rice, wheat, potatoes,  bread), lean meats, yogurt, fruits, and vegetables. °· Avoid high-fat foods because they are more difficult to digest. °· Drink enough water and fluids to keep your urine clear or pale yellow. °· If you are dehydrated, ask your caregiver for specific rehydration instructions. Signs of dehydration may include: °· Severe thirst. °· Dry lips and mouth. °· Dizziness. °· Dark urine. °· Decreasing urine frequency and amount. °· Confusion. °· Rapid breathing or pulse. °SEEK IMMEDIATE MEDICAL CARE IF:  °· You have blood or brown flecks (like coffee grounds) in your vomit. °· You have black or bloody stools. °· You have a severe headache or stiff neck. °· You are confused. °· You have severe abdominal pain. °· You have chest pain or trouble breathing. °· You do not urinate at least once every 8 hours. °· You develop cold or clammy skin. °· You continue to vomit for longer than 24 to 48 hours. °· You have a fever. °MAKE SURE YOU:  °· Understand these instructions. °· Will watch your condition. °· Will get help right away if you are not doing well or get worse. °Document Released: 06/03/2005 Document Revised: 08/26/2011 Document Reviewed: 10/31/2010 °ExitCare® Patient Information ©2014 [employer]. ° °

## 2013-08-14 NOTE — ED Provider Notes (Signed)
CSN: [id number]     Arrival date & time 08/14/13  1134 History   First MD Initiated Contact with Patient 08/14/13 1152     Chief Complaint  Patient presents with  . Emesis  . Diarrhea     (Consider location/radiation/quality/duration/timing/severity/associated sxs/prior Treatment) HPI Comments: Presents to the ER for evaluation of nausea, vomiting, diarrhea with body aches. Symptoms began overnight. She went to work this morning, was sent home because of these symptoms. She has had diffuse abdominal cramping. No focal pain. No blood in her vomit or diarrhea. No upper respiratory infection symptoms.  Patient is a 23 y.o. female presenting with vomiting and diarrhea.  Emesis Associated symptoms: abdominal pain, diarrhea and myalgias   Diarrhea Associated symptoms: abdominal pain, myalgias and vomiting     Past Medical History  Diagnosis Date  . Ovarian cyst     left side  . Hypertension    Past Surgical History  Procedure Laterality Date  . Tonsillectomy    . Wisdom tooth extraction    . Dilation and curettage of uterus    . Cesarean section  04/09/2011    Procedure: CESAREAN SECTION;  Surgeon: Loney LaurenceMichelle A Horvath;  Location: [facility];  Service: Gynecology;  Laterality: N/A;  . Knee arthroscopy     Family History  Problem Relation Age of Onset  . Diabetes Mother   . Diabetes Maternal Grandmother   . Diabetes Maternal Grandfather   . Diabetes Paternal Grandmother   . Stroke Paternal Grandmother    History  Substance Use Topics  . Smoking status: Never Smoker   . Smokeless tobacco: Not on file  . Alcohol Use: No   OB History   Grav Para Term Preterm Abortions TAB SAB Ect Mult Living   4 1 0 1 3 1 1 1 0 1      Review of Systems  Gastrointestinal: Positive for vomiting, abdominal pain and diarrhea.  Musculoskeletal: Positive for myalgias.  All other systems reviewed and are negative.      Allergies  Lemon juice; Orange juice; Citrus; Penicillins; Sulfa  antibiotics; Amoxicillin; and Latex  Home Medications   Current Outpatient Rx  Name  Route  Sig  Dispense  Refill  . aspirin-acetaminophen-caffeine (EXCEDRIN MIGRAINE) 250-250-65 MG per tablet   Oral   Take 1 tablet by mouth every 6 (six) hours as needed for headache (pain).          BP 139/67  Pulse 105  Temp(Src) 100.1 F (37.8 C) (Oral)  Resp 22  Ht 5\' 7"  (1.702 m)  Wt 353 lb (160.12 kg)  BMI 55.27 kg/m2  SpO2 100%  LMP 08/02/2013 Physical Exam  Constitutional: She is oriented to person, place, and time. She appears well-developed and well-nourished. No distress.  HENT:  Head: Normocephalic and atraumatic.  Right Ear: Hearing normal.  Left Ear: Hearing normal.  Nose: Nose normal.  Mouth/Throat: Oropharynx is clear and moist and mucous membranes are normal.  Eyes: Conjunctivae and EOM are normal. Pupils are equal, round, and reactive to light.  Neck: Normal range of motion. Neck supple.  Cardiovascular: Regular rhythm, S1 normal and S2 normal.  Exam reveals no gallop and no friction rub.   No murmur heard. Pulmonary/Chest: Effort normal and breath sounds normal. No respiratory distress. She exhibits no tenderness.  Abdominal: Soft. Normal appearance and bowel sounds are normal. There is no hepatosplenomegaly. There is no tenderness. There is no rebound, no guarding, no tenderness at McBurney's point and negative Murphy's sign. No hernia.  Musculoskeletal:  Normal range of motion.  Neurological: She is alert and oriented to person, place, and time. She has normal strength. No cranial nerve deficit or sensory deficit. Coordination normal. GCS eye subscore is 4. GCS verbal subscore is 5. GCS motor subscore is 6.  Skin: Skin is warm, dry and intact. No rash noted. No cyanosis.  Psychiatric: She has a normal mood and affect. Her speech is normal and behavior is normal. Thought content normal.    ED Course  Procedures (including critical care time) Labs Review Labs Reviewed   CBC WITH DIFFERENTIAL  COMPREHENSIVE METABOLIC PANEL  URINALYSIS, ROUTINE W REFLEX MICROSCOPIC  PREGNANCY, URINE   Imaging Review No results found.   EKG Interpretation None      MDM   Final diagnoses:  None    Patient presents to the ER for evaluation of nausea, vomiting and diarrhea since last night. She is experiencing this abdominal cramping. Exam, however, is benign. No focal tenderness, guarding or rebound.  Blood work is entirely normal. Urinalysis was also normal. Symptoms are most consistent with a viral etiology. Patient was treated with IV fluids, Zofran, Lomotil. She continues to do well here in the ER. Repeat abdominal exam is still benign. The patient will therefore be maintained on outpatient Lomotil and Phenergan, return to ER if symptoms worsen. Work note for 2 days.    Gilda Crease, MD 08/16/13 [phone]

## 2013-08-14 NOTE — ED Notes (Signed)
N/V/D, body aches since last night

## 2013-08-14 NOTE — ED Notes (Signed)
IVF will only run at about 53000mll/hr due to small bore IV.  Will wait for pt to recive entire liter prior to d/c.

## 2013-11-24 ENCOUNTER — Encounter (HOSPITAL_COMMUNITY): Payer: Self-pay

## 2013-11-24 ENCOUNTER — Inpatient Hospital Stay (HOSPITAL_COMMUNITY)
Admission: AD | Admit: 2013-11-24 | Discharge: 2013-11-25 | Disposition: A | Discharge status: Home / Self Care | Payer: Medicaid Other | Source: Ambulatory Visit | Attending: Obstetrics and Gynecology | Admitting: Obstetrics and Gynecology

## 2013-11-24 DIAGNOSIS — O209 Hemorrhage in early pregnancy, unspecified: Secondary | ICD-10-CM

## 2013-11-24 DIAGNOSIS — O26859 Spotting complicating pregnancy, unspecified trimester: Secondary | ICD-10-CM | POA: Insufficient documentation

## 2013-11-24 DIAGNOSIS — O10019 Pre-existing essential hypertension complicating pregnancy, unspecified trimester: Secondary | ICD-10-CM | POA: Diagnosis not present

## 2013-11-24 DIAGNOSIS — R109 Unspecified abdominal pain: Secondary | ICD-10-CM | POA: Insufficient documentation

## 2013-11-24 LAB — WET PREP, GENITAL
Clue Cells Wet Prep HPF POC: NONE SEEN
Trich, Wet Prep: NONE SEEN
Yeast Wet Prep HPF POC: NONE SEEN

## 2013-11-24 LAB — URINALYSIS, ROUTINE W REFLEX MICROSCOPIC
BILIRUBIN URINE: NEGATIVE
GLUCOSE, UA: NEGATIVE mg/dL
Hgb urine dipstick: NEGATIVE
KETONES UR: NEGATIVE mg/dL
LEUKOCYTES UA: NEGATIVE
NITRITE: NEGATIVE
PH: 6 (ref 5.0–8.0)
Protein, ur: NEGATIVE mg/dL
Urobilinogen, UA: 0.2 mg/dL (ref 0.0–1.0)

## 2013-11-24 LAB — CBC
HCT: 32.5 % — ABNORMAL LOW (ref 36.0–46.0)
HEMOGLOBIN: 10.3 g/dL — AB (ref 12.0–15.0)
MCH: 22.2 pg — AB (ref 26.0–34.0)
MCHC: 31.7 g/dL (ref 30.0–36.0)
MCV: 70 fL — AB (ref 78.0–100.0)
PLATELETS: 305 10*3/uL (ref 150–400)
RBC: 4.64 MIL/uL (ref 3.87–5.11)
RDW: 15.9 % — ABNORMAL HIGH (ref 11.5–15.5)
WBC: 11.2 10*3/uL — AB (ref 4.0–10.5)

## 2013-11-24 LAB — HCG, QUANTITATIVE, PREGNANCY: hCG, Beta Chain, Quant, S: 1247 m[IU]/mL — ABNORMAL HIGH (ref <5)

## 2013-11-24 LAB — POCT PREGNANCY, URINE: Preg Test, Ur: POSITIVE — AB

## 2013-11-24 NOTE — MAU Note (Signed)
+  HPT today. Has been cramping and spotting x3 days. Thought she was getting her period. LMP-May 10th.

## 2013-11-24 NOTE — MAU Provider Note (Signed)
Chief Complaint: Abdominal Cramping and Vaginal Bleeding   First Provider Initiated Contact with Patient 11/24/13 2254     SUBJECTIVE HPI: Kimberly [name] is a 23 y.o. [phone] at [redacted]w[redacted]d by LMP who presents to maternity admissions reporting spotting and abdominal cramping x24 hours.  She reports late menses, with 21 day menstrual cycles and expected menses 6 days ago.  Patient's last menstrual period was 10/24/2013.  She has hx of ectopic pregnancy and came in to be evaluated for this.  She denies vaginal itching/burning, urinary symptoms, h/a, dizziness, n/v, or fever/chills.     Past Medical History  Diagnosis Date  . Ovarian cyst     left side  . Hypertension    Past Surgical History  Procedure Laterality Date  . Tonsillectomy    . Wisdom tooth extraction    . Dilation and curettage of uterus    . Cesarean section  04/09/2011    Procedure: CESAREAN SECTION;  Surgeon: Loney Laurence;  Location: [facility];  Service: Gynecology;  Laterality: N/A;  . Knee arthroscopy     History   Social History  . Marital Status: Single    Spouse Name: N/A    Number of Children: N/A  . Years of Education: N/A   Occupational History  . Not on file.   Social History Main Topics  . Smoking status: Never Smoker   . Smokeless tobacco: Not on file  . Alcohol Use: No  . Drug Use: No  . Sexual Activity: Yes    Birth Control/ Protection: None   Other Topics Concern  . Not on file   Social History Narrative  . No narrative on file   No current facility-administered medications on file prior to encounter.   Current Outpatient Prescriptions on File Prior to Encounter  Medication Sig Dispense Refill  . promethazine (PHENERGAN) 25 MG tablet Take 1 tablet (25 mg total) by mouth every 6 (six) hours as needed for nausea or vomiting.  30 tablet  0   Allergies  Allergen Reactions  . Lemon Juice   . Orange Juice   . Citrus   . Penicillins Itching  . Sulfa Antibiotics Itching  . Amoxicillin  Rash  . Latex Itching and Rash    ROS: Pertinent items in HPI  OBJECTIVE Blood pressure 147/94, pulse 93, temperature 98.3 F (36.8 C), temperature source Oral, resp. rate 18, height 5' 6.5" (1.689 m), weight 160.846 kg (354 lb 9.6 oz), last menstrual period 10/24/2013, SpO2 100.00%. GENERAL: Well-developed, well-nourished female in no acute distress.  HEENT: Normocephalic HEART: normal rate RESP: normal effort ABDOMEN: Soft, non-tender EXTREMITIES: Nontender, no edema NEURO: Alert and oriented Pelvic exam: Cervix pink, visually closed, without lesion, scant white creamy discharge, vaginal walls and external genitalia normal Bimanual exam: Cervix 0/long/high, firm, anterior, neg CMT, uterus nontender, nonenlarged, adnexa without tenderness, enlargement, or mass  LAB RESULTS Results for orders placed during the hospital encounter of 11/24/13 (from the past 24 hour(s))  URINALYSIS, ROUTINE W REFLEX MICROSCOPIC     Status: Abnormal   Collection Time    11/24/13 10:19 PM      Result Value Ref Range   Color, Urine YELLOW  YELLOW   APPearance CLEAR  CLEAR   Specific Gravity, Urine >1.030 (*) 1.005 - 1.030   pH 6.0  5.0 - 8.0   Glucose, UA NEGATIVE  NEGATIVE mg/dL   Hgb urine dipstick NEGATIVE  NEGATIVE   Bilirubin Urine NEGATIVE  NEGATIVE   Ketones, ur NEGATIVE  NEGATIVE  mg/dL   Protein, ur NEGATIVE  NEGATIVE mg/dL   Urobilinogen, UA 0.2  0.0 - 1.0 mg/dL   Nitrite NEGATIVE  NEGATIVE   Leukocytes, UA NEGATIVE  NEGATIVE  POCT PREGNANCY, URINE     Status: Abnormal   Collection Time    11/24/13 10:41 PM      Result Value Ref Range   Preg Test, Ur POSITIVE (*) NEGATIVE  WET PREP, GENITAL     Status: Abnormal   Collection Time    11/24/13 11:00 PM      Result Value Ref Range   Yeast Wet Prep HPF POC NONE SEEN  NONE SEEN   Trich, Wet Prep NONE SEEN  NONE SEEN   Clue Cells Wet Prep HPF POC NONE SEEN  NONE SEEN   WBC, Wet Prep HPF POC FEW (*) NONE SEEN  CBC     Status: Abnormal    Collection Time    11/24/13 11:27 PM      Result Value Ref Range   WBC 11.2 (*) 4.0 - 10.5 K/uL   RBC 4.64  3.87 - 5.11 MIL/uL   Hemoglobin 10.3 (*) 12.0 - 15.0 g/dL   HCT 16.1 (*) 09.6 - 04.5 %   MCV 70.0 (*) 78.0 - 100.0 fL   MCH 22.2 (*) 26.0 - 34.0 pg   MCHC 31.7  30.0 - 36.0 g/dL   RDW 40.9 (*) 81.1 - 91.4 %   Platelets 305  150 - 400 K/uL  HCG, QUANTITATIVE, PREGNANCY     Status: Abnormal   Collection Time    11/24/13 11:27 PM      Result Value Ref Range   hCG, Beta Chain, Quant, S 1247 (*) <5 mIU/mL    IMAGING US Ob Comp Less 14 Wks  11/25/2013   CLINICAL DATA:  Spotting, right lower quadrant pain, history of ectopic.  EXAM: OBSTETRIC <14 WK [place] AND TRANSVAGINAL OB US  TECHNIQUE: Both transabdominal and transvaginal ultrasound examinations were performed for complete evaluation of the gestation as well as the maternal uterus, adnexal regions, and pelvic cul-de-sac. Transvaginal technique was performed to assess early pregnancy.  COMPARISON:  Multiple priors.  FINDINGS: Intrauterine gestational sac: Not visualized  Yolk sac:  None  Embryo:  None  Cardiac Activity: None  Heart Rate:  Not applicable bpm  Maternal uterus/adnexae: Small corpus luteum cyst in the right ovary. Otherwise normal. Normal left ovary. Trace free fluid.  IMPRESSION: No definite early intrauterine gestational sac, and no yolk sac, fetal pole, or cardiac activity yet visualized. Recommend follow-up quantitative B-HCG levels and follow-up US in 14 days to confirm and assess viability. This recommendation follows SRU consensus guidelines: Diagnostic Criteria for Nonviable Pregnancy Early in the First Trimester. Malva Limes Med 2013; 782:9562-13.   Electronically Signed   By: Davonna Belling M.D.   On: 11/25/2013 01:27   US Ob Transvaginal  11/25/2013   CLINICAL DATA:  Spotting, right lower quadrant pain, history of ectopic.  EXAM: OBSTETRIC <14 WK [place] AND TRANSVAGINAL OB US  TECHNIQUE: Both transabdominal and transvaginal  ultrasound examinations were performed for complete evaluation of the gestation as well as the maternal uterus, adnexal regions, and pelvic cul-de-sac. Transvaginal technique was performed to assess early pregnancy.  COMPARISON:  Multiple priors.  FINDINGS: Intrauterine gestational sac: Not visualized  Yolk sac:  None  Embryo:  None  Cardiac Activity: None  Heart Rate:  Not applicable bpm  Maternal uterus/adnexae: Small corpus luteum cyst in the right ovary. Otherwise normal. Normal left  ovary. Trace free fluid.  IMPRESSION: No definite early intrauterine gestational sac, and no yolk sac, fetal pole, or cardiac activity yet visualized. Recommend follow-up quantitative B-HCG levels and follow-up US in 14 days to confirm and assess viability. This recommendation follows SRU consensus guidelines: Diagnostic Criteria for Nonviable Pregnancy Early in the First Trimester. Malva Limes Engl J Med 2013; 409:8119-14; 369:1443-51.   Electronically Signed   By: Davonna BellingJohn  Curnes M.D.   On: 11/25/2013 01:27    ASSESSMENT 1. Vaginal bleeding in pregnant patient at less than [redacted] weeks gestation     PLAN Tylenol 1000 mg given in MAU for abdominal pain following pelvic exam and U/S Discharge home with bleeding and ectopic precautions Return to MAU in 48 hours for repeat quant hcg Return sooner as needed for emergencies    Medication List    STOP taking these medications       aspirin-acetaminophen-caffeine 250-250-65 MG per tablet  Commonly known as:  EXCEDRIN MIGRAINE     diphenoxylate-atropine 2.5-0.025 MG per tablet  Commonly known as:  LOMOTIL      TAKE these medications       promethazine 25 MG tablet  Commonly known as:  PHENERGAN  Take 1 tablet (25 mg total) by mouth every 6 (six) hours as needed for nausea or vomiting.       Follow-up Information   [facility] In [facility] days. (Return to MAU on Saturday, 11/27/13 in the morning for repeat labs.  Return sooner, As  needed for emergencies)    Contact information:   [address] [id number] [place]  [place] [id number] [phone]      Sharen CounterLisa Leftwich-Kirby Certified Nurse-Midwife 11/25/2013  3:01 AM

## 2013-11-25 ENCOUNTER — Inpatient Hospital Stay (HOSPITAL_COMMUNITY): Payer: Medicaid Other

## 2013-11-25 DIAGNOSIS — O209 Hemorrhage in early pregnancy, unspecified: Secondary | ICD-10-CM

## 2013-11-25 LAB — GC/CHLAMYDIA PROBE AMP
CT PROBE, AMP APTIMA: NEGATIVE
GC Probe RNA: NEGATIVE

## 2013-11-25 MED ORDER — ACETAMINOPHEN 500 MG PO TABS
1000.0000 mg | ORAL_TABLET | ORAL | Status: AC
Start: 2013-11-25 — End: 2013-11-25
  Administered 2013-11-25: 1000 mg via ORAL
  Filled 2013-11-25: qty 2

## 2013-11-25 NOTE — Discharge Instructions (Signed)
Vaginal Bleeding During Pregnancy, First Trimester A small amount of bleeding (spotting) from the vagina is relatively common in early pregnancy. It usually stops on its own. Various things may cause bleeding or spotting in early pregnancy. Some bleeding may be related to the pregnancy, and some may not. In most cases, the bleeding is normal and is not a problem. However, bleeding can also be a sign of something serious. Be sure to tell your health care provider about any vaginal bleeding right away. Some possible causes of vaginal bleeding during the first trimester include:  Infection or inflammation of the cervix.  Growths (polyps) on the cervix.  Miscarriage or threatened miscarriage.  Pregnancy tissue has developed outside of the uterus and in a fallopian tube (tubal pregnancy).  Tiny cysts have developed in the uterus instead of pregnancy tissue (molar pregnancy). HOME CARE INSTRUCTIONS  Watch your condition for any changes. The following actions may help to lessen any discomfort you are feeling:  Follow your health care provider's instructions for limiting your activity. If your health care provider orders bed rest, you may need to stay in bed and only get up to use the bathroom. However, your health care provider may allow you to continue light activity.  If needed, make plans for someone to help with your regular activities and responsibilities while you are on bed rest.  Keep track of the number of pads you use each day, how often you change pads, and how soaked (saturated) they are. Write this down.  Do not use tampons. Do not douche.  Do not have sexual intercourse or orgasms until approved by your health care provider.  If you pass any tissue from your vagina, save the tissue so you can show it to your health care provider.  Only take over-the-counter or prescription medicines as directed by your health care provider.  Do not take aspirin because it can make you  bleed.  Keep all follow-up appointments as directed by your health care provider. SEEK MEDICAL CARE IF:  You have any vaginal bleeding during any part of your pregnancy.  You have cramps or labor pains. SEEK IMMEDIATE MEDICAL CARE IF:   You have severe cramps in your back or belly (abdomen).  You have a fever, not controlled by medicine.  You pass large clots or tissue from your vagina.  Your bleeding increases.  You feel lightheaded or weak, or you have fainting episodes.  You have chills.  You are leaking fluid or have a gush of fluid from your vagina.  You pass out while having a bowel movement. MAKE SURE YOU:  Understand these instructions.  Will watch your condition.  Will get help right away if you are not doing well or get worse. Document Released: 03/13/2005 Document Revised: 03/24/2013 Document Reviewed: 02/08/2013 [facility] Patient Information 2014 [place]. Ectopic Pregnancy An ectopic pregnancy is when the fertilized egg attaches (implants) outside the uterus. Most ectopic pregnancies occur in the fallopian tube. Rarely do ectopic pregnancies occur on the ovary, intestine, pelvis, or cervix. In an ectopic pregnancy, the fertilized egg does not have the ability to develop into a normal, healthy baby.  A ruptured ectopic pregnancy is one in which the fallopian tube gets torn or bursts and results in internal bleeding. Often there is intense abdominal pain, and sometimes, vaginal bleeding. Having an ectopic pregnancy can be life threatening. If left untreated, this dangerous condition can lead to a blood transfusion, abdominal surgery, or even death. CAUSES  Damage to the  fallopian tubes is the suspected cause in most ectopic pregnancies.  RISK FACTORS Depending on your circumstances, the risk of having an ectopic pregnancy will vary. The level of risk can be divided into three categories. High Risk  You have gone through infertility treatment.  You have  had a previous ectopic pregnancy.  You have had previous tubal surgery.  You have had previous surgery to have the fallopian tubes tied (tubal ligation).  You have tubal problems or diseases.  You have been exposed to DES. DES is a medicine that was used until 1971 and had effects on babies whose mothers took the medicine.  You become pregnant while using an intrauterine device (IUD) for birth control. Moderate Risk  You have a history of infertility.  You have a history of a sexually transmitted infection (STI).  You have a history of pelvic inflammatory disease (PID).  You have scarring from endometriosis.  You have multiple sexual partners.  You smoke. Low Risk  You have had previous pelvic surgery.  You use vaginal douching.  You became sexually active before 25 years of age. SIGNS AND SYMPTOMS  An ectopic pregnancy should be suspected in anyone who has missed a period and has abdominal pain or bleeding.  You may experience normal pregnancy symptoms, such as:  Nausea.  Tiredness.  Breast tenderness.  Other symptoms may include:  Pain with intercourse.  Irregular vaginal bleeding or spotting.  Cramping or pain on one side or in the lower abdomen.  Fast heartbeat.  Passing out while having a bowel movement.  Symptoms of a ruptured ectopic pregnancy and internal bleeding may include:  Sudden, severe pain in the abdomen and pelvis.  Dizziness or fainting.  Pain in the shoulder area. DIAGNOSIS  Tests that may be performed include:  A pregnancy test.  An ultrasound test.  Testing the specific level of pregnancy hormone in the bloodstream.  Taking a sample of uterus tissue (dilation and curettage, D&C).  Surgery to perform a visual exam of the inside of the abdomen using a thin, lighted tube with a tiny camera on the end (laparoscope). TREATMENT  An injection of a medicine called methotrexate may be given. This medicine causes the pregnancy  tissue to be absorbed. It is given if:  The diagnosis is made early.  The fallopian tube has not ruptured.  You are considered to be a good candidate for the medicine. Usually, pregnancy hormone blood levels are checked after methotrexate treatment. This is to be sure the medicine is effective. It may take 4 6 weeks for the pregnancy to be absorbed (though most pregnancies will be absorbed by 3 weeks). Surgical treatment may be needed. A laparoscope may be used to remove the pregnancy tissue. If severe internal bleeding occurs, a cut (incision) may be made in the lower abdomen (laparotomy), and the ectopic pregnancy is removed. This stops the bleeding. Part of the fallopian tube, or the whole tube, may be removed as well (salpingectomy). After surgery, pregnancy hormone tests may be done to be sure there is no pregnancy tissue left. You may receive an Rho(D) immune globulin shot if you are Rh negative and the father is Rh positive, or if you do not know the Rh type of the father. This is to prevent problems with any future pregnancy. SEEK IMMEDIATE MEDICAL CARE IF:  You have any symptoms of an ectopic pregnancy. This is a medical emergency. Document Released: 07/11/2004 Document Revised: 03/24/2013 Document Reviewed: 12/31/2012 [facility] Patient Information 2014 [place], [place].

## 2013-11-26 ENCOUNTER — Inpatient Hospital Stay (HOSPITAL_COMMUNITY)
Admission: AD | Admit: 2013-11-26 | Discharge: 2013-11-26 | Disposition: A | Discharge status: Home / Self Care | Payer: Medicaid Other | Source: Ambulatory Visit | Attending: Obstetrics & Gynecology | Admitting: Obstetrics & Gynecology

## 2013-11-26 DIAGNOSIS — O10019 Pre-existing essential hypertension complicating pregnancy, unspecified trimester: Secondary | ICD-10-CM | POA: Insufficient documentation

## 2013-11-26 DIAGNOSIS — O26859 Spotting complicating pregnancy, unspecified trimester: Secondary | ICD-10-CM | POA: Diagnosis present

## 2013-11-26 DIAGNOSIS — O219 Vomiting of pregnancy, unspecified: Secondary | ICD-10-CM

## 2013-11-26 DIAGNOSIS — O3680X0 Pregnancy with inconclusive fetal viability, not applicable or unspecified: Secondary | ICD-10-CM

## 2013-11-26 DIAGNOSIS — O210 Mild hyperemesis gravidarum: Secondary | ICD-10-CM | POA: Insufficient documentation

## 2013-11-26 DIAGNOSIS — O209 Hemorrhage in early pregnancy, unspecified: Secondary | ICD-10-CM

## 2013-11-26 LAB — HCG, QUANTITATIVE, PREGNANCY: hCG, Beta Chain, Quant, S: 2245 m[IU]/mL — ABNORMAL HIGH (ref <5)

## 2013-11-26 MED ORDER — PRENATAL PLUS 27-1 MG PO TABS: 1.0000 | ORAL_TABLET | Freq: Every day | ORAL | Status: DC

## 2013-11-26 MED ORDER — PROMETHAZINE HCL 12.5 MG PO TABS: 12.5000 mg | ORAL_TABLET | Freq: Four times a day (QID) | ORAL | Status: DC | PRN

## 2013-11-26 NOTE — MAU Note (Signed)
D.Poe CNM in Triage with pt to discuss lab results and d/c plan.

## 2013-11-26 NOTE — MAU Note (Addendum)
Here for repeat lab work. Cont to have some cramping which was present at last visit. Denies bleeding. Throws up once a day

## 2013-11-26 NOTE — Discharge Instructions (Signed)
Prenatal Care Providers °[facility]    [facility] ° Phone- [phone]     Phone: [phone] °         °[facility]                      [facility] ° @[place]     Phone: [phone] ° Phone: [phone] °        [facility] °[facility]     Phone: [phone] ° Phone: [phone]   °        [facility] °[facility] [place]                hone: [phone] ° Phone: [phone] °        [facility] °Dr. Bernard Marshall      Phone: [phone] ° Phone: [phone] °        [facility] °[facility]                Phone: [phone] ° Women’s [facility]  ° Phone:[phone]    [facility]) °         Phone: [phone] °[facility] °  Phone: [phone] - First Trimester °During sexual intercourse, millions of sperm go into the vagina. Only 1 sperm will penetrate and fertilize the female egg while it is in the Fallopian tube. One week later, the fertilized egg implants into the wall of the uterus. An embryo begins to develop into a baby. At 6 to 8 weeks, the eyes and face are formed and the heartbeat can be seen on ultrasound. At the end of 12 weeks (first trimester), all the baby's organs are formed. Now that you are pregnant, you will want to do everything you can to have a healthy baby. Two of the most important things are to get good prenatal care and follow your caregiver's instructions. Prenatal care is all the medical care you receive before the baby's birth. It is given to prevent, find, and treat problems during the pregnancy and childbirth. °PRENATAL EXAMS °· During prenatal visits, your weight, blood pressure, and urine are checked. This is done to make sure you are healthy and progressing normally during the pregnancy. °· A pregnant woman should gain 25 to 35 pounds during the pregnancy. However, if you are overweight or  underweight, your caregiver will advise you regarding your weight. °· Your caregiver will ask and answer questions for you. °· Blood work, cervical cultures, other necessary tests, and a Pap test are done during your prenatal exams. These tests are done to check on your health and the probable health of your baby. Tests are strongly recommended and done for HIV with your permission. This is the virus that causes AIDS. These tests are done because medicines can be given to help prevent your baby from being born with this infection should you have been infected without knowing it. Blood work is also used to find out your blood type, previous infections, and follow your blood levels (hemoglobin). °· Low hemoglobin (anemia) is common during pregnancy. Iron and vitamins are given to help prevent this. Later in the pregnancy, blood tests for diabetes will be done along with any other tests if any problems develop. °· You may need other tests to make sure you and the baby are doing well. °CHANGES DURING THE FIRST TRIMESTER  °Your body   goes through many changes during pregnancy. They vary from person to person. Talk to your caregiver about changes you notice and are concerned about. Changes can include: °· Your menstrual period stops. °· The egg and sperm carry the genes that determine what you look like. Genes from you and your partner are forming a baby. The female genes determine whether the baby is a boy or a girl. °· Your body increases in girth and you may feel bloated. °· Feeling sick to your stomach (nauseous) and throwing up (vomiting). If the vomiting is uncontrollable, call your caregiver. °· Your breasts will begin to enlarge and become tender. °· Your nipples may stick out more and become darker. °· The need to urinate more. Painful urination may mean you have a bladder infection. °· Tiring easily. °· Loss of appetite. °· Cravings for certain kinds of food. °· At first, you may gain or lose a couple of  pounds. °· You may have changes in your emotions from day to day (excited to be pregnant or concerned something may go wrong with the pregnancy and baby). °· You may have more vivid and strange dreams. °HOME CARE INSTRUCTIONS  °· It is very important to avoid all smoking, alcohol and non-prescribed drugs during your pregnancy. These affect the formation and growth of the baby. Avoid chemicals while pregnant to ensure the delivery of a healthy infant. °· Start your prenatal visits by the 12th week of pregnancy. They are usually scheduled monthly at first, then more often in the last 2 months before delivery. Keep your caregiver's appointments. Follow your caregiver's instructions regarding medicine use, blood and lab tests, exercise, and diet. °· During pregnancy, you are providing food for you and your baby. Eat regular, well-balanced meals. Choose foods such as meat, fish, milk and other low fat dairy products, vegetables, fruits, and whole-grain breads and cereals. Your caregiver will tell you of the ideal weight gain. °· You can help morning sickness by keeping soda crackers at the bedside. Eat a couple before arising in the morning. You may want to use the crackers without salt on them. °· Eating 4 to 5 small meals rather than 3 large meals a day also may help the nausea and vomiting. °· Drinking liquids between meals instead of during meals also seems to help nausea and vomiting. °· A physical sexual relationship may be continued throughout pregnancy if there are no other problems. Problems may be early (premature) leaking of amniotic fluid from the membranes, vaginal bleeding, or belly (abdominal) pain. °· Exercise regularly if there are no restrictions. Check with your caregiver or physical therapist if you are unsure of the safety of some of your exercises. Greater weight gain will occur in the last 2 trimesters of pregnancy. Exercising will help: °· Control your weight. °· Keep you in shape. °· Prepare you  for labor and delivery. °· Help you lose your pregnancy weight after you deliver your baby. °· Wear a good support or jogging bra for breast tenderness during pregnancy. This may help if worn during sleep too. °· Ask when prenatal classes are available. Begin classes when they are offered. °· Do not use hot tubs, steam rooms, or saunas. °· Wear your seat belt when driving. This protects you and your baby if you are in an accident. °· Avoid raw meat, uncooked cheese, cat litter boxes, and soil used by cats throughout the pregnancy. These carry germs that can cause birth defects in the baby. °· The first trimester is   a good time to visit your dentist for your dental health. Getting your teeth cleaned is okay. Use a softer toothbrush and brush gently during pregnancy. °· Ask for help if you have financial, counseling, or nutritional needs during pregnancy. Your caregiver will be able to offer counseling for these needs as well as refer you for other special needs. °· Do not take any medicines or herbs unless told by your caregiver. °· Inform your caregiver if there is any mental or physical domestic violence. °· Make a list of emergency phone numbers of family, friends, hospital, and police and fire departments. °· Write down your questions. Take them to your prenatal visit. °· Do not douche. °· Do not cross your legs. °· If you have to stand for long periods of time, rotate you feet or take small steps in a circle. °· You may have more vaginal secretions that may require a sanitary pad. Do not use tampons or scented sanitary pads. °MEDICINES AND DRUG USE IN PREGNANCY °· Take prenatal vitamins as directed. The vitamin should contain 1 milligram of folic acid. Keep all vitamins out of reach of children. Only a couple vitamins or tablets containing iron may be fatal to a baby or young child when ingested. °· Avoid use of all medicines, including herbs, over-the-counter medicines, not prescribed or suggested by your  caregiver. Only take over-the-counter or prescription medicines for pain, discomfort, or fever as directed by your caregiver. Do not use aspirin, ibuprofen, or naproxen unless directed by your caregiver. °· Let your caregiver also know about herbs you may be using. °· Alcohol is related to a number of birth defects. This includes fetal alcohol syndrome. All alcohol, in any form, should be avoided completely. Smoking will cause low birth rate and premature babies. °· Street or illegal drugs are very harmful to the baby. They are absolutely forbidden. A baby born to an addicted mother will be addicted at birth. The baby will go through the same withdrawal an adult does. °· Let your caregiver know about any medicines that you have to take and for what reason you take them. °SEEK MEDICAL CARE IF:  °You have any concerns or worries during your pregnancy. It is better to call with your questions if you feel they cannot wait, rather than worry about them. °SEEK IMMEDIATE MEDICAL CARE IF:  °· An unexplained oral temperature above 102° F (38.9° C) develops, or as your caregiver suggests. °· You have leaking of fluid from the vagina (birth canal). If leaking membranes are suspected, take your temperature and inform your caregiver of this when you call. °· There is vaginal spotting or bleeding. Notify your caregiver of the amount and how many pads are used. °· You develop a bad smelling vaginal discharge with a change in the color. °· You continue to feel sick to your stomach (nauseated) and have no relief from remedies suggested. You vomit blood or coffee ground-like materials. °· You lose more than 2 pounds of weight in 1 week. °· You gain more than 2 pounds of weight in 1 week and you notice swelling of your face, hands, feet, or legs. °· You gain 5 pounds or more in 1 week (even if you do not have swelling of your hands, face, legs, or feet). °· You get exposed to German measles and have never had them. °· You are exposed  to fifth disease or chickenpox. °· You develop belly (abdominal) pain. Round ligament discomfort is a common non-cancerous (benign) cause of   abdominal pain in pregnancy. Your caregiver still must evaluate this. °· You develop headache, fever, diarrhea, pain with urination, or shortness of breath. °· You fall or are in a car accident or have any kind of trauma. °· There is mental or physical violence in your home. °Document Released: 05/28/2001 Document Revised: 02/26/2012 Document Reviewed: 11/29/2008 °ExitCare® Patient Information ©2014 [employer]. ° °

## 2013-11-26 NOTE — MAU Provider Note (Signed)
Chief Complaint: No chief complaint on file.    SUBJECTIVE HPI: Kimberly [name] is a 23 y.o. [id number] at 684w5d by LMP with hx 21 d cycle intervals here for follow up quantitative beta hCG. She presented here 48 hours ago with scant spotting and cramping. Her quantitative beta hCG was 1247. Still with mild cramping and N/V. No vaginal bleeding.  Hx ectopic.   Past Medical History  Diagnosis Date  . Ovarian cyst     left side  . Hypertension    OB History  Gravida Para Term Preterm AB SAB TAB Ectopic Multiple Living  5 1 0 1 3 1 1 1 0 1     # Outcome Date GA Lbr Len/2nd Weight Sex Delivery Anes PTL Lv  5 CUR           4 PRE 04/09/11 727w5d  0.86 kg (1 lb 14.3 oz) F LTCS Spinal  Y  3 ECT           2 SAB           1 TAB              Past Surgical History  Procedure Laterality Date  . Tonsillectomy    . Wisdom tooth extraction    . Dilation and curettage of uterus    . Cesarean section  04/09/2011    Procedure: CESAREAN SECTION;  Surgeon: Loney LaurenceMichelle A Horvath;  Location: [facility];  Service: Gynecology;  Laterality: N/A;  . Knee arthroscopy     History   Social History  . Marital Status: Single    Spouse Name: N/A    Number of Children: N/A  . Years of Education: N/A   Occupational History  . Not on file.   Social History Main Topics  . Smoking status: Never Smoker   . Smokeless tobacco: Not on file  . Alcohol Use: No  . Drug Use: No  . Sexual Activity: Yes    Birth Control/ Protection: None   Other Topics Concern  . Not on file   Social History Narrative  . No narrative on file   No current facility-administered medications on file prior to encounter.   Current Outpatient Prescriptions on File Prior to Encounter  Medication Sig Dispense Refill  . promethazine (PHENERGAN) 25 MG tablet Take 1 tablet (25 mg total) by mouth every 6 (six) hours as needed for nausea or vomiting.  30 tablet  0   Allergies  Allergen Reactions  . Lemon Juice   . Orange Juice   .  Citrus   . Penicillins Itching  . Sulfa Antibiotics Itching  . Amoxicillin Rash  . Latex Itching and Rash    ROS: Pertinent items in HPI  OBJECTIVE Last menstrual period 10/24/2013. GENERAL: Well-developed, well-nourished female in no acute distress.  HEENT: Normocephalic HEART: normal rate RESP: normal effort ABDOMEN: Soft, non-tender EXTREMITIES: Nontender, no edema NEURO: Alert and oriented  LAB RESULTS Results for orders placed during the hospital encounter of 11/26/13 (from the past 24 hour(s))  HCG, QUANTITATIVE, PREGNANCY     Status: Abnormal   Collection Time    11/26/13 10:00 PM      Result Value Ref Range   hCG, Beta Chain, Quant, S 2245 (*) <5 mIU/mL    IMAGING   [place] Ob Transvaginal  11/25/2013   CLINICAL DATA:  Spotting, right lower quadrant pain, history of ectopic.  EXAM: OBSTETRIC <14 WK US AND TRANSVAGINAL OB US  TECHNIQUE: Both transabdominal and transvaginal ultrasound examinations  were performed for complete evaluation of the gestation as well as the maternal uterus, adnexal regions, and pelvic cul-de-sac. Transvaginal technique was performed to assess early pregnancy.  COMPARISON:  Multiple priors.  FINDINGS: Intrauterine gestational sac: Not visualized  Yolk sac:  None  Embryo:  None  Cardiac Activity: None  Heart Rate:  Not applicable bpm  Maternal uterus/adnexae: Small corpus luteum cyst in the right ovary. Otherwise normal. Normal left ovary. Trace free fluid.  IMPRESSION: No definite early intrauterine gestational sac, and no yolk sac, fetal pole, or cardiac activity yet visualized. Recommend follow-up quantitative B-HCG levels and follow-up US in 14 days to confirm and assess viability. This recommendation follows SRU consensus guidelines: Diagnostic Criteria for Nonviable Pregnancy Early in the First Trimester. Malva Limes Engl J Med 2013; 657:8469-62; 369:1443-51.   Electronically Signed   By: Davonna BellingJohn  Curnes M.D.   On: 11/25/2013 01:27    MAU COURSE  ASSESSMENT 1. Bleeding in  early pregnancy   2. Pregnancy, location unknown   3. Nausea/vomiting in pregnancy   Appropriate rise in quantitative beta hCG  PLAN Discharge home with ectopic precautions    Medication List         prenatal vitamin w/FE, FA 27-1 MG Tabs tablet  Take 1 tablet by mouth daily.     promethazine 12.5 MG tablet  Commonly known as:  PHENERGAN  Take 1 tablet (12.5 mg total) by mouth every 6 (six) hours as needed for nausea or vomiting.     promethazine 12.5 MG tablet  Commonly known as:  PHENERGAN  Take 1 tablet (12.5 mg total) by mouth every 6 (six) hours as needed for nausea or vomiting.       Follow-up Information   Please follow up. (Someone from Ultrasound will call you with appt. See list of Prenatal Care providers and make appointment)        Danae Orleanseirdre C Poe, CNM 11/26/2013  9:58 PM

## 2013-11-27 NOTE — MAU Provider Note (Signed)
`````  Attestation of Attending Supervision of Advanced Practitioner: Evaluation and management procedures were performed by the PA/NP/CNM/OB Fellow under my supervision/collaboration. Chart reviewed and agree with management and plan.  Imelda Dandridge V 11/27/2013 10:01 AM

## 2013-11-27 NOTE — MAU Provider Note (Signed)
Attestation of Attending Supervision of Advanced Practitioner (PA/CNM/NP): Evaluation and management procedures were performed by the Advanced Practitioner under my supervision and collaboration.  I have reviewed the Advanced Practitioner's note and chart, and I agree with the management and plan.  Araceli Coufal, MD, FACOG Attending Obstetrician & Gynecologist Faculty Practice, [facility]  

## 2013-11-29 ENCOUNTER — Inpatient Hospital Stay (HOSPITAL_COMMUNITY)
Admission: AD | Admit: 2013-11-29 | Discharge: 2013-11-29 | Disposition: A | Discharge status: Home / Self Care | Payer: Medicaid Other | Source: Ambulatory Visit | Attending: Obstetrics & Gynecology | Admitting: Obstetrics & Gynecology

## 2013-11-29 ENCOUNTER — Encounter (HOSPITAL_COMMUNITY): Payer: Self-pay | Admitting: *Deleted

## 2013-11-29 ENCOUNTER — Inpatient Hospital Stay (HOSPITAL_COMMUNITY): Payer: Medicaid Other

## 2013-11-29 DIAGNOSIS — O209 Hemorrhage in early pregnancy, unspecified: Secondary | ICD-10-CM

## 2013-11-29 DIAGNOSIS — O3680X0 Pregnancy with inconclusive fetal viability, not applicable or unspecified: Secondary | ICD-10-CM

## 2013-11-29 DIAGNOSIS — O10019 Pre-existing essential hypertension complicating pregnancy, unspecified trimester: Secondary | ICD-10-CM | POA: Insufficient documentation

## 2013-11-29 DIAGNOSIS — O26859 Spotting complicating pregnancy, unspecified trimester: Secondary | ICD-10-CM | POA: Insufficient documentation

## 2013-11-29 DIAGNOSIS — R109 Unspecified abdominal pain: Secondary | ICD-10-CM | POA: Diagnosis not present

## 2013-11-29 HISTORY — DX: Obesity, unspecified: E66.9

## 2013-11-29 MED ORDER — PRENATAL PLUS 27-1 MG PO TABS: 1.0000 | ORAL_TABLET | Freq: Every day | ORAL | Status: DC

## 2013-11-29 NOTE — MAU Note (Signed)
Bleeding had stopped, started again this morning.  Changed about 3 times today, no clots.  Cramping in lower abd.

## 2013-11-29 NOTE — MAU Provider Note (Signed)
Attestation of Attending Supervision of Advanced Practitioner (CNM/NP): Evaluation and management procedures were performed by the Advanced Practitioner under my supervision and collaboration.  I have reviewed the Advanced Practitioner's note and chart, and I agree with the management and plan.  HARRAWAY-SMITH, Raylon Lamson 9:17 PM

## 2013-11-29 NOTE — MAU Provider Note (Signed)
Chief Complaint: Vaginal Bleeding and Abdominal Cramping   First Provider Initiated Contact with Patient 11/29/13 1841     SUBJECTIVE HPI: Rogers BlockerKendra N Carachure is a 23 y.o. Z6X0960G5P0131 at [redacted]w[redacted]d (2955w1d by short cycle LMP) who  presents with worsening suprapubic abdominal cramps (rated 9/10) and recurrence of bleeding today. Used 3 pads since this am, none saturated. She presented here 09/24/13 with scant spotting and cramping. Her quantitative beta hCG was 1247. She returned 2 days later on 09/26/12 for follow-up beta-hCG which rose appropriately to 2245. Her spotting had stopped and cramping was milder.  Hx ectopic x1.   Past Medical History  Diagnosis Date  . Ovarian cyst     left side  . Hypertension   . Obesity    OB History  Gravida Para Term Preterm AB SAB TAB Ectopic Multiple Living  5 1 0 1 3 1 1 1 0 1     # Outcome Date GA Lbr Len/2nd Weight Sex Delivery Anes PTL Lv  5 CUR           4 PRE 04/09/11 352w5d  0.86 kg (1 lb 14.3 oz) F LTCS Spinal  Y  3 ECT           2 SAB           1 TAB              Past Surgical History  Procedure Laterality Date  . Tonsillectomy    . Wisdom tooth extraction    . Dilation and curettage of uterus    . Cesarean section  04/09/2011    Procedure: CESAREAN SECTION;  Surgeon: Loney LaurenceMichelle A Horvath;  Location: [facility];  Service: Gynecology;  Laterality: N/A;  . Knee arthroscopy     History   Social History  . Marital Status: Single    Spouse Name: N/A    Number of Children: N/A  . Years of Education: N/A   Occupational History  . Not on file.   Social History Main Topics  . Smoking status: Never Smoker   . Smokeless tobacco: Not on file  . Alcohol Use: No  . Drug Use: No  . Sexual Activity: Not Currently    Birth Control/ Protection: None     Comment: last sex one week ago   Other Topics Concern  . Not on file   Social History Narrative  . No narrative on file   No current facility-administered medications on file prior to encounter.    Current Outpatient Prescriptions on File Prior to Encounter  Medication Sig Dispense Refill  . promethazine (PHENERGAN) 12.5 MG tablet Take 1 tablet (12.5 mg total) by mouth every 6 (six) hours as needed for nausea or vomiting.  30 tablet  0   Allergies  Allergen Reactions  . Lemon Juice Itching and Other (See Comments)    Tongue swelling  . Orange Juice Itching and Other (See Comments)    Tongue swelling  . Citrus Itching and Other (See Comments)    Tongue swelling  . Penicillins Itching  . Sulfa Antibiotics Itching  . Amoxicillin Rash  . Latex Itching and Rash    ROS: Pertinent items in HPI  OBJECTIVE Blood pressure 133/71, pulse 94, temperature 98.5 F (36.9 C), temperature source Oral, resp. rate 20, last menstrual period 10/24/2013. GENERAL: Well-developed, well-nourished female in no acute distress.  HEENT: Normocephalic HEART: normal rate RESP: normal effort ABDOMEN: Soft, non-tender EXTREMITIES: Nontender, no edema NEURO: Alert and oriented SPECULUM EXAM: NEFG, physiologic discharge,  scant brown blood noted, cervix clean  BIMANUAL: cervix thick/closed; uterus normal size, no adnexal tenderness or masses  LAB RESULTS No results found for this or any previous visit (from the past 24 hour(s)).  IMAGING US Ob Comp Less 14 Wks  11/25/2013   CLINICAL DATA:  Spotting, right lower quadrant pain, history of ectopic.  EXAM: OBSTETRIC <14 WK [place] AND TRANSVAGINAL OB US  TECHNIQUE: Both transabdominal and transvaginal ultrasound examinations were performed for complete evaluation of the gestation as well as the maternal uterus, adnexal regions, and pelvic cul-de-sac. Transvaginal technique was performed to assess early pregnancy.  COMPARISON:  Multiple priors.  FINDINGS: Intrauterine gestational sac: Not visualized  Yolk sac:  None  Embryo:  None  Cardiac Activity: None  Heart Rate:  Not applicable bpm  Maternal uterus/adnexae: Small corpus luteum cyst in the right ovary.  Otherwise normal. Normal left ovary. Trace free fluid.  IMPRESSION: No definite early intrauterine gestational sac, and no yolk sac, fetal pole, or cardiac activity yet visualized. Recommend follow-up quantitative B-HCG levels and follow-up US in 14 days to confirm and assess viability. This recommendation follows SRU consensus guidelines: Diagnostic Criteria for Nonviable Pregnancy Early in the First Trimester. Malva Limes Med 2013; 782:9562-13.   Electronically Signed   By: Davonna Belling M.D.   On: 11/25/2013 01:27  US Ob Transvaginal  11/29/2013   CLINICAL DATA:  Abdominal cramping  EXAM: TRANSVAGINAL OB ULTRASOUND  TECHNIQUE: Transvaginal ultrasound was performed for complete evaluation of the gestation as well as the maternal uterus, adnexal regions, and pelvic cul-de-sac.  COMPARISON:  None.  FINDINGS: Intrauterine gestational sac: Visualized with irregular contour.  Yolk sac:  Not present  Embryo:  Not present  Cardiac Activity: Not present  MSD: 8.5  mm   5 w   3  d         [place] EDC: 07/29/2014  Maternal uterus/adnexae: The left ovary is not visualized. The right ovary is unremarkable. There is no adnexal mass. There is a trace amount of pelvic free fluid. There is a small subchorionic hemorrhage measuring 1.7 x 0.7 x 0.7 cm.  IMPRESSION: 1. Intrauterine gestational sac without a yolk sac or fetal pole. This may reflect an early pregnancy versus blighted ovum. There is a small subchorionic hemorrhage. Probable early intrauterine gestational sac, but no yolk sac, fetal pole, or cardiac activity yet visualized. Recommend follow-up quantitative B-HCG levels and follow-up US in 14 days to confirm and assess viability. This recommendation follows SRU consensus guidelines: Diagnostic Criteria for Nonviable Pregnancy Early in the First Trimester. Malva Limes Med 2013; 086:5784-69.   Electronically Signed   By: Elige Ko   On: 11/29/2013 20:39    MAU COURSE Consulted Dr. Erin Fulling will check  progesterone Explained above [place] result  ASSESSMENT 1. Pregnancy, location unknown   2. Bleeding in early pregnancy   Small SCH Hx ectopic  PLAN Discharge home with ectopic precautions Acetaminophen for cramps    Medication List         prenatal vitamin w/FE, FA 27-1 MG Tabs tablet  Take 1 tablet by mouth daily.     promethazine 12.5 MG tablet  Commonly known as:  PHENERGAN  Take 1 tablet (12.5 mg total) by mouth every 6 (six) hours as needed for nausea or vomiting.         Follow-up Information   Follow up with [facility] [place] [facility]. (Keep [place] appointment as scheduled)    Specialty:  Radiology  Contact information:   [address] [id number] [facility] [place] [id number] [phone]      Danae OrleansDeirdre C Poe, CNM 11/29/2013  6:42 PM

## 2013-11-30 LAB — PROGESTERONE: PROGESTERONE: 17.4 ng/mL

## 2013-12-11 ENCOUNTER — Encounter (HOSPITAL_BASED_OUTPATIENT_CLINIC_OR_DEPARTMENT_OTHER): Payer: Self-pay | Admitting: Emergency Medicine

## 2013-12-11 ENCOUNTER — Emergency Department (HOSPITAL_BASED_OUTPATIENT_CLINIC_OR_DEPARTMENT_OTHER)
Admission: EM | Admit: 2013-12-11 | Discharge: 2013-12-11 | Disposition: A | Discharge status: Home / Self Care | Payer: Medicaid Other | Source: Home / Self Care | Attending: Emergency Medicine | Admitting: Emergency Medicine

## 2013-12-11 ENCOUNTER — Emergency Department (HOSPITAL_BASED_OUTPATIENT_CLINIC_OR_DEPARTMENT_OTHER)
Admission: EM | Admit: 2013-12-11 | Discharge: 2013-12-11 | Discharge status: Against Medical Advice | Payer: Medicaid Other | Attending: Emergency Medicine | Admitting: Emergency Medicine

## 2013-12-11 DIAGNOSIS — O9989 Other specified diseases and conditions complicating pregnancy, childbirth and the puerperium: Secondary | ICD-10-CM

## 2013-12-11 DIAGNOSIS — E669 Obesity, unspecified: Secondary | ICD-10-CM | POA: Diagnosis not present

## 2013-12-11 DIAGNOSIS — N939 Abnormal uterine and vaginal bleeding, unspecified: Secondary | ICD-10-CM

## 2013-12-11 DIAGNOSIS — Z91040 Latex allergy status: Secondary | ICD-10-CM

## 2013-12-11 DIAGNOSIS — IMO0002 Reserved for concepts with insufficient information to code with codable children: Secondary | ICD-10-CM | POA: Insufficient documentation

## 2013-12-11 DIAGNOSIS — O169 Unspecified maternal hypertension, unspecified trimester: Secondary | ICD-10-CM | POA: Insufficient documentation

## 2013-12-11 DIAGNOSIS — L218 Other seborrheic dermatitis: Secondary | ICD-10-CM | POA: Diagnosis not present

## 2013-12-11 DIAGNOSIS — Z3490 Encounter for supervision of normal pregnancy, unspecified, unspecified trimester: Secondary | ICD-10-CM

## 2013-12-11 DIAGNOSIS — L219 Seborrheic dermatitis, unspecified: Secondary | ICD-10-CM

## 2013-12-11 DIAGNOSIS — Z79899 Other long term (current) drug therapy: Secondary | ICD-10-CM

## 2013-12-11 DIAGNOSIS — O99210 Obesity complicating pregnancy, unspecified trimester: Secondary | ICD-10-CM

## 2013-12-11 DIAGNOSIS — R21 Rash and other nonspecific skin eruption: Secondary | ICD-10-CM

## 2013-12-11 DIAGNOSIS — O209 Hemorrhage in early pregnancy, unspecified: Secondary | ICD-10-CM | POA: Insufficient documentation

## 2013-12-11 DIAGNOSIS — Z8742 Personal history of other diseases of the female genital tract: Secondary | ICD-10-CM | POA: Diagnosis not present

## 2013-12-11 DIAGNOSIS — Z880 Allergy status to penicillin: Secondary | ICD-10-CM | POA: Insufficient documentation

## 2013-12-11 HISTORY — DX: Encounter for supervision of normal pregnancy, unspecified, unspecified trimester: Z34.90

## 2013-12-11 NOTE — ED Notes (Signed)
Pt states vaginal bleeding for a week along with being [redacted] weeks pregnant. Pt states she was here for a rash earlier and also wants to be seen for that as well.

## 2013-12-11 NOTE — ED Notes (Addendum)
Pt states that she has to be at work at 14:00. Pt states that she has to go to work and cannot call out anymore. EDP aware that patient wants to leave and spoke with patient about staying and having pregnancy with vaginal bleeding assessed. Pt states that she cannot stay but will come back tonight after her shift and finding someone to watch her daughter. Pt aware of the benefits of staying including further information about care and risks to leaving AMA including death. EDP at bedside discussing AMA and RN at bedside to witness patient refusal for medical care.

## 2013-12-11 NOTE — ED Provider Notes (Signed)
CSN: [id number]     Arrival date & time 12/11/13  1205 History  This chart was scribed for Glynn OctaveStephen Rancour, MD by Shari HeritageAisha Amuda, ED Scribe. The patient was seen in room MH08/MH08. Patient's care was started at 12:40 PM.    Chief Complaint  Patient presents with  . Pruritis    The history is provided by the patient. No language interpreter was used.   HPI Comments: Kimberly [name] is a 23 y.o. female who is 6-[redacted] weeks pregnant and presents to the Emergency Department complaining of constant, pruritis to her face, scalp, and posterior ears for the past 3 days. She does not use any makeup on her face. She states that she has not used any new products or eaten new foods recently. She has taken Benadryl at home with minimal relief. She denies shortness of breath, CP, dysphagia or any other symptoms. Patient also states that she had had an ultrasound of her current pregnancy on 11/29/13 - per medical record review, an intrauterine gestational sac without a yolk sac or fetal pole with small subchorionic and no fetal cardiac activity was seen. She has another ultrasound schedule for Monday. She has had some vaginal spotting for the last week and states that her current OB is aware of this, and that patient was instructed her providers know if bleeding worsens. Patient has a history of D&C, ectopic pregnancy, Cesarean section. She further reports that one of her children was born premature. She states that she also developed preeclampsia during a previous pregnancy. She denies vaginal discharge, abdominal pain, chest pain, shortness of breath, back pain or other symptoma at this time.  [id number].  Past Medical History  Diagnosis Date  . Ovarian cyst     left side  . Hypertension   . Obesity   . Pregnant    Past Surgical History  Procedure Laterality Date  . Tonsillectomy    . Wisdom tooth extraction    . Dilation and curettage of uterus    . Cesarean section  04/09/2011    Procedure: CESAREAN SECTION;   Surgeon: Loney LaurenceMichelle A Horvath;  Location: [facility];  Service: Gynecology;  Laterality: N/A;  . Knee arthroscopy     Family History  Problem Relation Age of Onset  . Diabetes Mother   . Diabetes Maternal Grandmother   . Diabetes Maternal Grandfather   . Diabetes Paternal Grandmother   . Stroke Paternal Grandmother    History  Substance Use Topics  . Smoking status: Never Smoker   . Smokeless tobacco: Not on file  . Alcohol Use: No   OB History   Grav Para Term Preterm Abortions TAB SAB Ect Mult Living   5 1 0 1 3 1 1 1 0 1      Review of Systems A complete 10 system review of systems was obtained and all systems are negative except as noted in the HPI and PMH.   Allergies  Lemon juice; Orange juice; Citrus; Penicillins; Sulfa antibiotics; Amoxicillin; and Latex  Home Medications   Prior to Admission medications   Medication Sig Start Date End Date Taking? Authorizing Provider  prenatal vitamin w/FE, FA (PRENATAL 1 + 1) 27-1 MG TABS tablet Take 1 tablet by mouth daily. 11/29/13   Deirdre Colin Mulders Poe, CNM   Triage Vitals: BP 122/64  Pulse 79  Temp(Src) 97.9 F (36.6 C) (Oral)  Resp 20  SpO2 100%  LMP 10/24/2013 Physical Exam  Nursing note and vitals reviewed. Constitutional: She is oriented to person, place,  and time. She appears well-developed and well-nourished. No distress.  HENT:  Head: Normocephalic and atraumatic.  Mouth/Throat: Oropharynx is clear and moist. No oropharyngeal exudate.  Eyes: Conjunctivae and EOM are normal. Pupils are equal, round, and reactive to light.  Neck: Normal range of motion. Neck supple.  No meningismus.  Cardiovascular: Normal rate, regular rhythm, normal heart sounds and intact distal pulses.   No murmur heard. Pulmonary/Chest: Effort normal and breath sounds normal. No respiratory distress.  Abdominal: Soft. There is no tenderness. There is no rebound and no guarding.  Obese abdomen.  Musculoskeletal: Normal range of motion. She exhibits no  edema and no tenderness.  Neurological: She is alert and oriented to person, place, and time. No cranial nerve deficit. She exhibits normal muscle tone. Coordination normal.  Skin: Skin is warm. Rash noted.  Scaling rash to scalp and posterior ears with excoriation.  Psychiatric: She has a normal mood and affect. Her behavior is normal.    ED Course  Procedures (including critical care time) DIAGNOSTIC STUDIES: Oxygen Saturation is 100% on room air, normal by my interpretation.    COORDINATION OF CARE: 1:01 PM- Patient presents for evaluation of pruritis, but also reveals that she is 6-[redacted] weeks pregnant. She admits to vaginal bleeding for the past week. When asked if she would like further evaluation of her pregnancy, patient states that she has to be at work at [employer] and only wants to be treated for rash. I explained that given her history of ectopic pregnancy and preeclampsia, she is at risk for complications with current pregnancy. Advised patient that if she leaves the department without further workup she will be leaving against medical advice. Patient verbalizes understanding of this and stated that she would still like to leave without US or other studies.  Labs Review Labs Reviewed - No data to display  Imaging Review No results found.   EKG Interpretation None      MDM   Final diagnoses:  Seborrheic dermatitis  Pregnancy   Patient reports itching to face and scalp for the past 3 days. Denies any new hygiene products or makeup. Has been using Benadryl with little relief. She is approximately [redacted] weeks pregnant, IUP is not confirmed. She had an ultrasound on June 15 showed a gestational sac without fetal pole. She is scheduled for another ultrasound in 2 days. She endorses she's had spotting for the past week. Denies abdominal pain.  Her rash is consistent with seborrheic dermatitis. This was discussed with on-call women's MAU NP Bonita QuinLinda who states the safest thing is topical  hydrocortisone. Patient understands this is category C and should be used sparingly.  The more concerning issues patient's pregnancy with vaginal bleeding. She does not want this evaluated. She states that she needs to go to work. She understands that she is at risk for having an ectopic pregnancy which can be life-threatening. She feels she can follow up with her obstetrician Dr. Henderson CloudHorvath on Monday. She has capacity to make her own medical decisions will leave AGAINST MEDICAL ADVICE. She understands ectopic pregnancy has not been ruled out. She's had an ectopic in the past.   I personally performed the services described in this documentation, which was scribed in my presence. The recorded information has been reviewed and is accurate.    Glynn OctaveStephen Rancour, MD 12/11/13 1452

## 2013-12-11 NOTE — Discharge Instructions (Signed)
KEEP YOUR APPOINTMENT WITH YOUR OB/GYN ON Monday FOR FURTHER EVALUATION.

## 2013-12-11 NOTE — ED Provider Notes (Signed)
CSN: [id number]     Arrival date & time 12/11/13  1618 History   First MD Initiated Contact with Patient 12/11/13 1649     Chief Complaint  Patient presents with  . Vaginal Bleeding  . Rash     (Consider location/radiation/quality/duration/timing/severity/associated sxs/prior Treatment) HPI Comments: The patient returns to the ED after being seen earlier today for a skin infection. At the previous visit she disclosed she was having vaginal bleeding and was pregnant. She has been followed for the vaginal bleeding by OB/GYN and reports having an ultrasound last week that showed an IUP and steadily increasing Hcg's. She had to leave the department earlier today after treatment for the skin infection but was encouraged to return for further evaluation of vaginal bleeding after being asked to sign out AMA. No abdominal cramping or pain.  Patient is a 23 y.o. female presenting with vaginal bleeding and rash.  Vaginal Bleeding Associated symptoms: no dysuria, no fever and no vaginal discharge   Rash Associated symptoms: no fever and no shortness of breath     Past Medical History  Diagnosis Date  . Ovarian cyst     left side  . Hypertension   . Obesity   . Pregnant    Past Surgical History  Procedure Laterality Date  . Tonsillectomy    . Wisdom tooth extraction    . Dilation and curettage of uterus    . Cesarean section  04/09/2011    Procedure: CESAREAN SECTION;  Surgeon: Loney LaurenceMichelle A Horvath;  Location: [facility];  Service: Gynecology;  Laterality: N/A;  . Knee arthroscopy     Family History  Problem Relation Age of Onset  . Diabetes Mother   . Diabetes Maternal Grandmother   . Diabetes Maternal Grandfather   . Diabetes Paternal Grandmother   . Stroke Paternal Grandmother    History  Substance Use Topics  . Smoking status: Never Smoker   . Smokeless tobacco: Not on file  . Alcohol Use: No   OB History   Grav Para Term Preterm Abortions TAB SAB Ect Mult Living   5 1 0 1 3 1  1 1  0 1     Review of Systems  Constitutional: Negative for fever.  Respiratory: Negative for shortness of breath.   Genitourinary: Positive for vaginal bleeding. Negative for dysuria and vaginal discharge.  Skin: Positive for rash.      Allergies  Lemon juice; Orange juice; Citrus; Penicillins; Sulfa antibiotics; Amoxicillin; and Latex  Home Medications   Prior to Admission medications   Medication Sig Start Date End Date Taking? Authorizing Shawny Borkowski  diphenhydrAMINE (SOMINEX) 25 MG tablet Take 25 mg by mouth at bedtime as needed for sleep.   Yes Historical Tarren Sabree, MD  hydrocortisone cream 0.5 % Apply 1 application topically 2 (two) times daily.   Yes Historical Per Beagley, MD  prenatal vitamin w/FE, FA (PRENATAL 1 + 1) 27-1 MG TABS tablet Take 1 tablet by mouth daily. 11/29/13  Yes Deirdre C Poe, CNM   BP 139/59  Pulse 81  Temp(Src) 97.8 F (36.6 C) (Oral)  Resp 18  Wt 354 lb 3 oz (160.658 kg)  SpO2 100%  LMP 10/24/2013 Physical Exam  Constitutional: She appears well-developed and well-nourished. No distress.  Abdominal: There is no tenderness.  Genitourinary:  Deferred.    ED Course  Procedures (including critical care time) Labs Review Labs Reviewed  HCG, QUANTITATIVE, PREGNANCY  CBC    Imaging Review No results found.   EKG Interpretation None  MDM   Final diagnoses:  None    1. Vaginal bleeding 2. Pregnant  After full discussion with the patient, she denies any new symptoms since having been seen by her GYN on 11/29/13. She has another appointment in 2 days for recheck and repeat ultrasound. She denies pain. Discussed we would check an HCG and Hgb but blood draw was difficult and patient declines any further attempt. She is comfortable with her symptoms and her follow up Monday (in 2 days) and requests discharge.     Arnoldo HookerShari A Upstill, PA-C 12/11/13 1925

## 2013-12-11 NOTE — ED Notes (Signed)
Patient here with facial itching x 3 days, denies new products or foods, reports taking liquid benadryl with minimal to no relief

## 2013-12-11 NOTE — ED Notes (Signed)
Pt given saltines and water. 

## 2013-12-11 NOTE — ED Notes (Signed)
Patient refused to allow blood draw except for hands, attempts X 2 unsuccessful

## 2013-12-11 NOTE — ED Notes (Signed)
Assumed care of patient from Steve, RN.  

## 2013-12-12 NOTE — ED Provider Notes (Signed)
Medical screening examination/treatment/procedure(s) were performed by non-physician practitioner and as supervising physician I was immediately available for consultation/collaboration.   EKG Interpretation None        Melanie Belfi, MD 12/12/13 0104 

## 2013-12-13 ENCOUNTER — Other Ambulatory Visit (HOSPITAL_COMMUNITY): Payer: Self-pay | Admitting: Obstetrics and Gynecology

## 2013-12-13 ENCOUNTER — Ambulatory Visit (HOSPITAL_COMMUNITY)
Admission: RE | Admit: 2013-12-13 | Discharge: 2013-12-13 | Disposition: A | Discharge status: Home / Self Care | Payer: Medicaid Other | Source: Ambulatory Visit | Attending: Obstetrics and Gynecology | Admitting: Obstetrics and Gynecology

## 2013-12-13 DIAGNOSIS — O208 Other hemorrhage in early pregnancy: Secondary | ICD-10-CM | POA: Diagnosis not present

## 2013-12-13 DIAGNOSIS — Z3689 Encounter for other specified antenatal screening: Secondary | ICD-10-CM | POA: Insufficient documentation

## 2013-12-13 DIAGNOSIS — O209 Hemorrhage in early pregnancy, unspecified: Secondary | ICD-10-CM

## 2014-01-15 ENCOUNTER — Inpatient Hospital Stay (HOSPITAL_COMMUNITY)
Admission: AD | Admit: 2014-01-15 | Discharge: 2014-01-15 | Disposition: A | Discharge status: Home / Self Care | Payer: Medicaid Other | Source: Ambulatory Visit | Attending: Obstetrics and Gynecology | Admitting: Obstetrics and Gynecology

## 2014-01-15 ENCOUNTER — Inpatient Hospital Stay (HOSPITAL_COMMUNITY): Payer: Medicaid Other

## 2014-01-15 ENCOUNTER — Encounter (HOSPITAL_COMMUNITY): Payer: Self-pay | Admitting: *Deleted

## 2014-01-15 DIAGNOSIS — N83209 Unspecified ovarian cyst, unspecified side: Secondary | ICD-10-CM | POA: Insufficient documentation

## 2014-01-15 DIAGNOSIS — R109 Unspecified abdominal pain: Secondary | ICD-10-CM | POA: Diagnosis not present

## 2014-01-15 DIAGNOSIS — O10019 Pre-existing essential hypertension complicating pregnancy, unspecified trimester: Secondary | ICD-10-CM | POA: Insufficient documentation

## 2014-01-15 DIAGNOSIS — O99891 Other specified diseases and conditions complicating pregnancy: Secondary | ICD-10-CM | POA: Insufficient documentation

## 2014-01-15 DIAGNOSIS — O26899 Other specified pregnancy related conditions, unspecified trimester: Secondary | ICD-10-CM

## 2014-01-15 DIAGNOSIS — O9989 Other specified diseases and conditions complicating pregnancy, childbirth and the puerperium: Principal | ICD-10-CM

## 2014-01-15 DIAGNOSIS — E669 Obesity, unspecified: Secondary | ICD-10-CM | POA: Diagnosis not present

## 2014-01-15 DIAGNOSIS — O99210 Obesity complicating pregnancy, unspecified trimester: Secondary | ICD-10-CM | POA: Diagnosis not present

## 2014-01-15 DIAGNOSIS — M549 Dorsalgia, unspecified: Secondary | ICD-10-CM | POA: Diagnosis not present

## 2014-01-15 DIAGNOSIS — O34599 Maternal care for other abnormalities of gravid uterus, unspecified trimester: Secondary | ICD-10-CM | POA: Diagnosis not present

## 2014-01-15 LAB — URINALYSIS, ROUTINE W REFLEX MICROSCOPIC
BILIRUBIN URINE: NEGATIVE
Glucose, UA: NEGATIVE mg/dL
Hgb urine dipstick: NEGATIVE
KETONES UR: NEGATIVE mg/dL
Leukocytes, UA: NEGATIVE
NITRITE: NEGATIVE
PROTEIN: NEGATIVE mg/dL
Specific Gravity, Urine: 1.02 (ref 1.005–1.030)
UROBILINOGEN UA: 0.2 mg/dL (ref 0.0–1.0)
pH: 6.5 (ref 5.0–8.0)

## 2014-01-15 NOTE — MAU Note (Signed)
Pt presents to MAU with complaints of abdominal cramping and lower back pain. Denies any vaginal bleeding

## 2014-01-15 NOTE — MAU Provider Note (Signed)
History     CSN: [id number]  Arrival date and time: 01/15/14 1253   None     No chief complaint on file.  HPI Pt is 3527w6d [id number] with hx of preterm delivery.  Pt had a small amount of spotting about 1 week ago when she wiped and has had cramping and back pain.  Pt is anxious because of history and not able to get new OB appointment yet. Pt had confirmed viable IUP 11/29/2013 with ultrasound Pt has appointment with Dr. Henderson CloudHorvath August 20 for New OB  Past Medical History  Diagnosis Date  . Ovarian cyst     left side  . Hypertension   . Obesity   . Pregnant     Past Surgical History  Procedure Laterality Date  . Tonsillectomy    . Wisdom tooth extraction    . Dilation and curettage of uterus    . Cesarean section  04/09/2011    Procedure: CESAREAN SECTION;  Surgeon: Loney LaurenceMichelle A Marcella Charlson;  Location: [facility];  Service: Gynecology;  Laterality: N/A;  . Knee arthroscopy      Family History  Problem Relation Age of Onset  . Diabetes Mother   . Diabetes Maternal Grandmother   . Diabetes Maternal Grandfather   . Diabetes Paternal Grandmother   . Stroke Paternal Grandmother     History  Substance Use Topics  . Smoking status: Never Smoker   . Smokeless tobacco: Not on file  . Alcohol Use: No    Allergies:  Allergies  Allergen Reactions  . Lemon Juice Itching and Other (See Comments)    Tongue swelling  . Orange Juice Itching and Other (See Comments)    Tongue swelling  . Citrus Itching and Other (See Comments)    Tongue swelling  . Penicillins Itching  . Sulfa Antibiotics Itching  . Amoxicillin Rash  . Latex Itching and Rash    Prescriptions prior to admission  Medication Sig Dispense Refill  . diphenhydrAMINE (SOMINEX) 25 MG tablet Take 25 mg by mouth at bedtime as needed for sleep.      . hydrocortisone cream 0.5 % Apply 1 application topically 2 (two) times daily.      . prenatal vitamin w/FE, FA (PRENATAL 1 + 1) 27-1 MG TABS tablet Take 1 tablet by mouth  daily.  30 each  0    Review of Systems  Constitutional: Negative for fever and chills.  Gastrointestinal: Positive for abdominal pain and diarrhea. Negative for nausea, vomiting and constipation.       Pt has one loose bowel movement every morning x 1  Genitourinary: Negative for dysuria and urgency.  Musculoskeletal: Positive for back pain.  Neurological: Negative for headaches.   Physical Exam   Last menstrual period 10/24/2013. BP 157/80 P 94 Resp 18 FHR not audible with doppler Pt has hx of hypertension Physical Exam  Nursing note and vitals reviewed. Constitutional: She is oriented to person, place, and time. She appears well-developed and well-nourished. No distress.  HENT:  Head: Normocephalic.  Eyes: Pupils are equal, round, and reactive to light.  Neck: Normal range of motion. Neck supple.  Respiratory: Effort normal.  GI: Soft. She exhibits no distension. There is no tenderness. There is no rebound.  Musculoskeletal: Normal range of motion.  Neurological: She is alert and oriented to person, place, and time.  Skin: Skin is warm and dry.    MAU Course  Procedures Results for orders placed during the hospital encounter of 01/15/14 (from the past 24  hour(s))  URINALYSIS, ROUTINE W REFLEX MICROSCOPIC     Status: Abnormal   Collection Time    01/15/14  1:00 PM      Result Value Ref Range   Color, Urine YELLOW  YELLOW   APPearance HAZY (*) CLEAR   Specific Gravity, Urine 1.020  1.005 - 1.030   pH 6.5  5.0 - 8.0   Glucose, UA NEGATIVE  NEGATIVE mg/dL   Hgb urine dipstick NEGATIVE  NEGATIVE   Bilirubin Urine NEGATIVE  NEGATIVE   Ketones, ur NEGATIVE  NEGATIVE mg/dL   Protein, ur NEGATIVE  NEGATIVE mg/dL   Urobilinogen, UA 0.2  0.0 - 1.0 mg/dL   Nitrite NEGATIVE  NEGATIVE   Leukocytes, UA NEGATIVE  NEGATIVE   Ultrasound [redacted]w[redacted]d living IUP  with normal findings  Assessment and Plan  Discharge home Keep record of blood pressure readings F/u with Dr. Henderson Cloud as  scheduled for new OB appointment- sooner if increase in symptoms or concerns LINEBERRY,SUSAN 01/15/2014, 1:11 PM

## 2014-01-25 ENCOUNTER — Inpatient Hospital Stay (HOSPITAL_COMMUNITY)
Admission: AD | Admit: 2014-01-25 | Discharge: 2014-01-26 | Disposition: A | Discharge status: Home / Self Care | Payer: Medicaid Other | Source: Ambulatory Visit | Attending: Obstetrics and Gynecology | Admitting: Obstetrics and Gynecology

## 2014-01-25 DIAGNOSIS — Z881 Allergy status to other antibiotic agents status: Secondary | ICD-10-CM | POA: Insufficient documentation

## 2014-01-25 DIAGNOSIS — O210 Mild hyperemesis gravidarum: Secondary | ICD-10-CM | POA: Insufficient documentation

## 2014-01-25 DIAGNOSIS — K089 Disorder of teeth and supporting structures, unspecified: Secondary | ICD-10-CM

## 2014-01-25 DIAGNOSIS — N83209 Unspecified ovarian cyst, unspecified side: Secondary | ICD-10-CM | POA: Insufficient documentation

## 2014-01-25 DIAGNOSIS — Z91040 Latex allergy status: Secondary | ICD-10-CM | POA: Insufficient documentation

## 2014-01-25 DIAGNOSIS — E669 Obesity, unspecified: Secondary | ICD-10-CM | POA: Diagnosis not present

## 2014-01-25 DIAGNOSIS — I10 Essential (primary) hypertension: Secondary | ICD-10-CM | POA: Insufficient documentation

## 2014-01-25 DIAGNOSIS — Z880 Allergy status to penicillin: Secondary | ICD-10-CM | POA: Diagnosis not present

## 2014-01-25 DIAGNOSIS — O9989 Other specified diseases and conditions complicating pregnancy, childbirth and the puerperium: Secondary | ICD-10-CM | POA: Diagnosis not present

## 2014-01-25 DIAGNOSIS — K047 Periapical abscess without sinus: Secondary | ICD-10-CM | POA: Insufficient documentation

## 2014-01-25 DIAGNOSIS — O99891 Other specified diseases and conditions complicating pregnancy: Secondary | ICD-10-CM | POA: Insufficient documentation

## 2014-01-25 LAB — URINE MICROSCOPIC-ADD ON

## 2014-01-25 LAB — URINALYSIS, ROUTINE W REFLEX MICROSCOPIC
BILIRUBIN URINE: NEGATIVE
Glucose, UA: NEGATIVE mg/dL
KETONES UR: NEGATIVE mg/dL
Leukocytes, UA: NEGATIVE
Nitrite: NEGATIVE
PROTEIN: NEGATIVE mg/dL
Specific Gravity, Urine: 1.01 (ref 1.005–1.030)
UROBILINOGEN UA: 0.2 mg/dL (ref 0.0–1.0)
pH: 6 (ref 5.0–8.0)

## 2014-01-25 NOTE — MAU Note (Signed)
Pt reports toothache x 3 days, seen at dentist but was told they could not do anything till Monday. Taking tylenol #3 and clindamycin. Also reports B/P

## 2014-01-25 NOTE — MAU Provider Note (Signed)
History     CSN: [id number]  Arrival date and time: 01/25/14 2042   First Provider Initiated Contact with Patient 01/25/14 2333      Chief Complaint  Patient presents with  . Dental Pain   HPI [name] is a 23 y.o. [id number] at 6046w3d who presents to MAU today with complaint of tooth pain and N/V. The patient states that she saw her dentist this morning and was given Clindamycin and Tylenol #3. She has taken the Tylenol #3 q 6 hours without relief. Last dose was at 1600 today. She states that N/V has been throughout the pregnancy, but was getting better until the tooth pain became severe recently. The patient is scheduled for removal of the tooth on Monday.   OB History   Grav Para Term Preterm Abortions TAB SAB Ect Mult Living   5 1 0 1 3 1 1 1 0 1       Past Medical History  Diagnosis Date  . Ovarian cyst     left side  . Hypertension   . Obesity   . Pregnant     Past Surgical History  Procedure Laterality Date  . Tonsillectomy    . Wisdom tooth extraction    . Dilation and curettage of uterus    . Cesarean section  04/09/2011    Procedure: CESAREAN SECTION;  Surgeon: Loney LaurenceMichelle A Saquan Furtick;  Location: [facility];  Service: Gynecology;  Laterality: N/A;  . Knee arthroscopy      Family History  Problem Relation Age of Onset  . Diabetes Mother   . Diabetes Maternal Grandmother   . Diabetes Maternal Grandfather   . Diabetes Paternal Grandmother   . Stroke Paternal Grandmother     History  Substance Use Topics  . Smoking status: Never Smoker   . Smokeless tobacco: Not on file  . Alcohol Use: No    Allergies:  Allergies  Allergen Reactions  . Lemon Juice Itching and Other (See Comments)    Tongue swelling  . Orange Juice Itching and Other (See Comments)    Tongue swelling  . Citrus Itching and Other (See Comments)    Tongue swelling  . Penicillins Itching  . Sulfa Antibiotics Itching  . Amoxicillin Rash  . Latex Itching and Rash    No  prescriptions prior to admission    Review of Systems  Constitutional: Positive for malaise/fatigue. Negative for fever.  HENT:       + tooth pain  Gastrointestinal: Positive for nausea and vomiting. Negative for abdominal pain.  Genitourinary:       Neg - vaginal bleeding   Physical Exam   Blood pressure 131/106, pulse 122, temperature 99 F (37.2 C), temperature source Oral, resp. rate 18, height 5\' 7"  (1.702 m), weight 350 lb (158.759 kg), last menstrual period 10/24/2013, SpO2 100.00%.  Physical Exam  Constitutional: She is oriented to person, place, and time. She appears well-developed and well-nourished. No distress.  Appears uncomfortable  HENT:  Head: Normocephalic.  Mouth/Throat: Abnormal dentition. Dental caries present. No dental abscesses.  Cardiovascular: Normal rate.   Respiratory: Effort normal.  Neurological: She is alert and oriented to person, place, and time.  Skin: Skin is warm and dry. No erythema.  Psychiatric: She has a normal mood and affect.   Results for orders placed during the hospital encounter of 01/25/14 (from the past 24 hour(s))  URINALYSIS, ROUTINE W REFLEX MICROSCOPIC     Status: Abnormal   Collection Time    01/25/14  9:50 PM      Result Value Ref Range   Color, Urine YELLOW  YELLOW   APPearance CLEAR  CLEAR   Specific Gravity, Urine 1.010  1.005 - 1.030   pH 6.0  5.0 - 8.0   Glucose, UA NEGATIVE  NEGATIVE mg/dL   Hgb urine dipstick SMALL (*) NEGATIVE   Bilirubin Urine NEGATIVE  NEGATIVE   Ketones, ur NEGATIVE  NEGATIVE mg/dL   Protein, ur NEGATIVE  NEGATIVE mg/dL   Urobilinogen, UA 0.2  0.0 - 1.0 mg/dL   Nitrite NEGATIVE  NEGATIVE   Leukocytes, UA NEGATIVE  NEGATIVE  URINE MICROSCOPIC-ADD ON     Status: Abnormal   Collection Time    01/25/14  9:50 PM      Result Value Ref Range   Squamous Epithelial / LPF FEW (*) RARE   WBC, UA 0-2  <3 WBC/hpf   RBC / HPF 7-10  <3 RBC/hpf   Bacteria, UA FEW (*) RARE     MAU Course   Procedures None  MDM FHR - 167 bpm with doppler UA today shows no signs of dehydration Patient is not actively vomiting in MAU today Discussed patient with Dr. Henderson Cloud. Give Rx for Phenergan so that patient can tolerate her antibiotics and pain medications.  Patient left prior to receiving Rx for Phenergan or discharge instructions  Assessment and Plan  A: SIUP Dental pain  P: Patient left without discharge instructions or Rx for Phenergan Encouraged to follow-up with dentist prior to leaving  Freddi Starr, PA-C  01/26/2014, 4:30 AM

## 2014-01-26 ENCOUNTER — Encounter (HOSPITAL_COMMUNITY): Payer: Self-pay | Admitting: Emergency Medicine

## 2014-01-26 ENCOUNTER — Other Ambulatory Visit: Payer: Self-pay

## 2014-01-26 ENCOUNTER — Emergency Department (HOSPITAL_COMMUNITY)
Admission: EM | Admit: 2014-01-26 | Discharge: 2014-01-26 | Disposition: A | Discharge status: Home / Self Care | Payer: Medicaid Other | Attending: Emergency Medicine | Admitting: Emergency Medicine

## 2014-01-26 DIAGNOSIS — K047 Periapical abscess without sinus: Secondary | ICD-10-CM

## 2014-01-26 LAB — OB RESULTS CONSOLE HIV ANTIBODY (ROUTINE TESTING): HIV: NONREACTIVE

## 2014-01-26 LAB — OB RESULTS CONSOLE HEPATITIS B SURFACE ANTIGEN: Hepatitis B Surface Ag: NEGATIVE

## 2014-01-26 LAB — OB RESULTS CONSOLE GC/CHLAMYDIA
CHLAMYDIA, DNA PROBE: NEGATIVE
GC PROBE AMP, GENITAL: NEGATIVE

## 2014-01-26 LAB — OB RESULTS CONSOLE RUBELLA ANTIBODY, IGM: Rubella: IMMUNE

## 2014-01-26 MED ORDER — ACETAMINOPHEN-CODEINE #3 300-30 MG PO TABS
1.0000 | ORAL_TABLET | Freq: Once | ORAL | Status: AC
  Administered 2014-01-26: 1 via ORAL
  Filled 2014-01-26: qty 1

## 2014-01-26 MED ORDER — ONDANSETRON HCL 4 MG/2ML IJ SOLN
4.0000 mg | Freq: Once | INTRAMUSCULAR | Status: AC
  Administered 2014-01-26: 4 mg via INTRAVENOUS
  Filled 2014-01-26: qty 2

## 2014-01-26 MED ORDER — CLINDAMYCIN PHOSPHATE 600 MG/50ML IV SOLN
600.0000 mg | Freq: Once | INTRAVENOUS | Status: AC
  Administered 2014-01-26: 600 mg via INTRAVENOUS
  Filled 2014-01-26: qty 50

## 2014-01-26 MED ORDER — OXYCODONE-ACETAMINOPHEN 5-325 MG PO TABS
1.0000 | ORAL_TABLET | Freq: Once | ORAL | Status: AC
  Administered 2014-01-26: 1 via ORAL
  Filled 2014-01-26: qty 1

## 2014-01-26 MED ORDER — SODIUM CHLORIDE 0.9 % IV BOLUS (SEPSIS)
1000.0000 mL | Freq: Once | INTRAVENOUS | Status: AC
  Administered 2014-01-26: 1000 mL via INTRAVENOUS

## 2014-01-26 NOTE — MAU Note (Signed)
Patient instructed she needs to wait for discharge instructions. Patient states she can't wait any longer she is in pain. Patient left without signing or AVS.

## 2014-01-26 NOTE — ED Provider Notes (Signed)
CSN: [id number]     Arrival date & time 01/26/14  1316 History   First MD Initiated Contact with Patient 01/26/14 1626     Chief Complaint  Patient presents with  . Dental Pain   (Consider location/radiation/quality/duration/timing/severity/associated sxs/prior Treatment) HPI Comments: Patients who is [redacted] weeks pregnant presents with complaint of left mandibular dental abscess for 2-3 days. Patient has been on clindamycin for this for the past 24 hours and has had approximately 5 doses of medication. She is also taking Tylenol #3 but states that she is not getting any relief from this. No significant neck swelling or shortness of breath. No fever. Patient states that she has had frequent vomiting since this began. No abdominal pain. No urinary symptoms. The onset of this condition was acute. The course is worsening. Aggravating factors: chewing. Alleviating factors: none.    Patient is a 23 y.o. female presenting with tooth pain. The history is provided by the patient.  Dental Pain Associated symptoms: facial swelling   Associated symptoms: no fever, no headaches and no neck pain     Past Medical History  Diagnosis Date  . Ovarian cyst     left side  . Hypertension   . Obesity   . Pregnant    Past Surgical History  Procedure Laterality Date  . Tonsillectomy    . Wisdom tooth extraction    . Dilation and curettage of uterus    . Cesarean section  04/09/2011    Procedure: CESAREAN SECTION;  Surgeon: Loney LaurenceMichelle A Horvath;  Location: [facility];  Service: Gynecology;  Laterality: N/A;  . Knee arthroscopy     Family History  Problem Relation Age of Onset  . Diabetes Mother   . Diabetes Maternal Grandmother   . Diabetes Maternal Grandfather   . Diabetes Paternal Grandmother   . Stroke Paternal Grandmother    History  Substance Use Topics  . Smoking status: Never Smoker   . Smokeless tobacco: Not on file  . Alcohol Use: No   OB History   Grav Para Term Preterm Abortions TAB SAB Ect  Mult Living   5 1 0 1 3 1 1 1 0 1      Review of Systems  Constitutional: Negative for fever.  HENT: Positive for dental problem and facial swelling. Negative for ear pain, sore throat and trouble swallowing.   Respiratory: Negative for shortness of breath and stridor.   Musculoskeletal: Negative for neck pain.  Skin: Negative for color change.  Neurological: Negative for headaches.   Allergies  Lemon juice; Orange juice; Citrus; Penicillins; Sulfa antibiotics; Amoxicillin; and Latex  Home Medications   Prior to Admission medications   Medication Sig Start Date End Date Taking? Authorizing Provider  acetaminophen-codeine (TYLENOL #3) 300-30 MG per tablet Take 1 tablet by mouth every 4 (four) hours as needed for moderate pain.   Yes Historical Provider, MD  clindamycin (CLEOCIN) 300 MG capsule Take 300 mg by mouth 3 (three) times daily.   Yes Historical Provider, MD  prenatal vitamin w/FE, FA (PRENATAL 1 + 1) 27-1 MG TABS tablet Take 1 tablet by mouth daily. 11/29/13  Yes Deirdre C Poe, CNM   BP 144/81  Pulse 113  Temp(Src) 98.5 F (36.9 C) (Oral)  Resp 16  SpO2 100%  LMP 10/24/2013  Physical Exam  Nursing note and vitals reviewed. Constitutional: She appears well-developed and well-nourished.  HENT:  Head: Normocephalic and atraumatic.  Right Ear: Tympanic membrane, external ear and ear canal normal.  Left Ear: Tympanic  membrane, external ear and ear canal normal.  Nose: Nose normal.  Mouth/Throat: Uvula is midline, oropharynx is clear and moist and mucous membranes are normal. No trismus in the jaw. Abnormal dentition. Dental caries present. No dental abscesses or uvula swelling. No tonsillar abscesses.  Patient with L mandibular tooth pain and tenderness to palpation in area of 2nd premolar. There is a small, approx 1 cm, area consistent with abscess.   Eyes: Conjunctivae are normal.  Neck: Normal range of motion. Neck supple.  No neck swelling or Ludwig's angina   Lymphadenopathy:    She has no cervical adenopathy.  Neurological: She is alert.  Skin: Skin is warm and dry.  Psychiatric: She has a normal mood and affect.    ED Course  Procedures (including critical care time) Labs Review Labs Reviewed - No data to display  Imaging Review No results found.   EKG Interpretation None      4:37 PM Patient seen and examined. Work-up initiated. Medications ordered. Will give IV fluids and antibiotics due to tachycardia and because she states she is feeling so poorly. Her exam is not all that impressive. She likely has small periapical abscess that is forming. She expects to be referred to the hospital's dental clinic however I informed her that does not exist. We do have a dental referral I can give.   Vital signs reviewed and are as follows: BP 144/81  Pulse 113  Temp(Src) 98.5 F (36.9 C) (Oral)  Resp 16  SpO2 100%  LMP 10/24/2013  6:24 PM Patient has received IV abx and IV fluids. She states she is ready to go home.   BP 147/86  Pulse 105  Temp(Src) 98.5 F (36.9 C) (Oral)  Resp 24  SpO2 100%  LMP 10/24/2013  Patient was given dental referral. She will continue the previously prescribed antibiotic and pain medication.  Patient urged to return with worsening symptoms or other concerns. Patient verbalized understanding and agrees with plan.   MDM   Final diagnoses:  Periapical abscess   Patient with toothache. No fever. Exam unconcerning for Ludwig's angina or other deep tissue infection in neck.    As there is gum swelling, erythema, and facial swelling, will continue with antibiotic and pain medicine. Urged patient to follow-up with dentist.    No dangerous or life-threatening conditions suspected or identified by history, physical exam, and by work-up. No indications for hospitalization identified.        Renne Crigler, PA-C 01/26/14 [phone]

## 2014-01-26 NOTE — Discharge Instructions (Signed)
Please read and follow all provided instructions.  Your diagnoses today include:  1. Periapical abscess    The exam and treatment you received today has been provided on an emergency basis only. This is not a substitute for complete medical or dental care.  Tests performed today include:  Vital signs. See below for your results today.   Medications prescribed:   None  Take any prescribed medications only as directed.  Home care instructions:  Follow any educational materials contained in this packet.  Continue clindamycin prescribed to you.   Follow-up instructions: Please follow-up with your dentist for further evaluation of your symptoms.   Dental Assistance: See below for dental referrals  Return instructions:   Please return to the Emergency Department if you experience worsening symptoms.  Please return if you develop a fever, you develop more swelling in your face or neck, you have trouble breathing or swallowing food.  Please return if you have any other emergent concerns.  Additional Information:  Your vital signs today were: BP 147/86   Pulse 105   Temp(Src) 98.5 F (36.9 C) (Oral)   Resp 24   SpO2 100%   LMP 10/24/2013 If your blood pressure (BP) was elevated above 135/85 this visit, please have this repeated by your doctor within one month. -------------- Dental Care: Organization         Address  Phone  Notes  [facility] [address], [place] [phone] Accepts children up to age 25 who are enrolled in [place] or [place] Health Choice; pregnant women with a Medicaid card; and children who have applied for Medicaid or [place] Health Choice, but were declined, whose parents can pay a reduced fee at time of service.  [facility] [place]  [address], [place] [phone] Accepts children up to age 25 who are enrolled in [place] or [place] Health Choice;  pregnant women with a Medicaid card; and children who have applied for Medicaid or  Health Choice, but were declined, whose parents can pay a reduced fee at time of service.  [facility]  [address], [place] [phone] Patients are seen by appointment only. Walk-ins are not accepted. [facility] will see patients 25 years of age and older. Monday - Tuesday (8am-5pm) Most Wednesdays (8:30-5pm) $30 per visit, cash only  [facility]  [address], [facility] [phone] Patients are seen by appointment only. Walk-ins are not accepted. [facility] will see patients 25 years of age and older. One Wednesday Evening (Monthly: Volunteer Based).  $30 per visit, cash only  [facility]  [phone] for adults; Children under age 404, call Graduate Pediatric Dentistry at [phone]. Children aged 674-14, please call [phone] to request a pediatric application.  Dental services are provided in all areas of dental care including fillings, crowns and bridges, complete and partial dentures, implants, gum treatment, root canals, and extractions. Preventive care is also provided. Treatment is provided to both adults and children. Patients are selected via a lottery and there is often a waiting list.   [facility] [address], [place]  [phone] [email or web address]   [facility] [address], [place], [place] [phone], Ext. [phone] Second and Fourth Thursday of each month, opens at 6:30 AM; Clinic ends at 9 AM.  Patients are seen on  a [employer]	[place] basis, and a limited number are seen during each clinic.   [facility]  [address] [place], [place] [phone]   Eligibility Requirements You must have lived in [place], [place], or [place] counties for at least the last three months.   You cannot be eligible for state or federal  sponsored [place], including [employer], [place], or [employer].   You generally cannot be eligible for healthcare insurance through your employer.    How to apply: Eligibility screenings are held every Tuesday and Wednesday afternoon from 1:00 pm until 4:00 pm. You do not need an appointment for the interview!  [facility] [address], [place], [place] [phone]   [facility]  [phone]   [facility]  [phone]   [facility]  [phone]

## 2014-01-26 NOTE — ED Notes (Addendum)
Pt reports being approx [redacted] weeks pregnant, went to ob dr today but sent here due to severe left lower dental pain and swelling, reports swelling is spreading to her left side of neck. Airway is intact, no resp distress noted at triage. Has been on clindamycin since yesterday and no relief with tylenol.

## 2014-01-27 ENCOUNTER — Encounter (HOSPITAL_COMMUNITY): Payer: Self-pay | Admitting: Obstetrics

## 2014-01-27 ENCOUNTER — Other Ambulatory Visit (HOSPITAL_COMMUNITY): Payer: Self-pay | Admitting: Obstetrics

## 2014-01-27 DIAGNOSIS — Z3689 Encounter for other specified antenatal screening: Secondary | ICD-10-CM

## 2014-01-27 DIAGNOSIS — O09299 Supervision of pregnancy with other poor reproductive or obstetric history, unspecified trimester: Secondary | ICD-10-CM

## 2014-01-27 DIAGNOSIS — E669 Obesity, unspecified: Secondary | ICD-10-CM

## 2014-01-31 NOTE — ED Provider Notes (Signed)
Medical screening examination/treatment/procedure(s) were performed by non-physician practitioner and as supervising physician I was immediately available for consultation/collaboration.    Iliani Vejar L Loran Fleet, MD 01/31/14 0742 

## 2014-02-01 ENCOUNTER — Ambulatory Visit (HOSPITAL_COMMUNITY): Payer: Medicaid Other

## 2014-02-01 ENCOUNTER — Encounter (HOSPITAL_COMMUNITY): Payer: Self-pay

## 2014-02-08 ENCOUNTER — Encounter (HOSPITAL_COMMUNITY): Payer: Self-pay

## 2014-02-08 ENCOUNTER — Ambulatory Visit (HOSPITAL_COMMUNITY)
Admission: RE | Admit: 2014-02-08 | Discharge: 2014-02-08 | Disposition: A | Discharge status: Home / Self Care | Payer: Medicaid Other | Source: Ambulatory Visit | Attending: Obstetrics | Admitting: Obstetrics

## 2014-02-08 VITALS — BP 130/60 | HR 87 | Wt 355.0 lb

## 2014-02-08 DIAGNOSIS — O09299 Supervision of pregnancy with other poor reproductive or obstetric history, unspecified trimester: Secondary | ICD-10-CM | POA: Diagnosis not present

## 2014-02-08 DIAGNOSIS — Z833 Family history of diabetes mellitus: Secondary | ICD-10-CM | POA: Insufficient documentation

## 2014-02-08 DIAGNOSIS — O365990 Maternal care for other known or suspected poor fetal growth, unspecified trimester, not applicable or unspecified: Secondary | ICD-10-CM | POA: Insufficient documentation

## 2014-02-08 DIAGNOSIS — O09292 Supervision of pregnancy with other poor reproductive or obstetric history, second trimester: Secondary | ICD-10-CM

## 2014-02-08 DIAGNOSIS — Z823 Family history of stroke: Secondary | ICD-10-CM | POA: Insufficient documentation

## 2014-02-08 NOTE — Progress Notes (Signed)
MATERNAL FETAL MEDICINE CONSULT  Patient Name: Kimberly [name] Medical Record Number:  [id number] Date of Birth: [birth year] Requesting Physician Name:  Marlow Baars, MD Date of Service: 02/08/2014  Chief Complaint Prior history of early onset severe preeclampsia and fetal growth restriction  History of Present Illness Kimberly [name] was seen today secondary to prior history of early onset severe preeclampsia and fetal growth restriction at the request of Marlow Baars, MD.  The patient is a 23 y.o. [id number],QQ [redacted]w[redacted]d with an EDD of 07/31/2014, by Last Menstrual Period dating method.  In her last pregnancy she developed severe preeclampsia at approximately 24 weeks of gestation.  She was managed expectantly as an inpatient and was found to have fetal growth restriction.  She was delivered at 29 weeks due to non-reassuring fetal status.  She did not require blood pressure medication in between pregnancies, and her BP's have been within the normal range so far this pregnancy.  She had an antiphospholipid workup after her last delivery and it was negative.  She currently denies headache, visual changes, RUQ pain, or swelling.    Review of Systems Pertinent items are noted in HPI.  Patient History OB History  Gravida Para Term Preterm AB SAB TAB Ectopic Multiple Living     # Outcome Date GA Lbr Len/2nd Weight Sex Delivery Anes PTL Lv  5 CUR           4 PRE 04/09/11 [redacted]w[redacted]d  1 lb 14.3 oz (0.86 kg) F LTCS Spinal  Y  3 ECT           2 SAB           1 TAB               Past Medical History  Diagnosis Date  . Ovarian cyst     left side  . Hypertension   . Obesity   . Pregnant     Past Surgical History  Procedure Laterality Date  . Tonsillectomy    . Wisdom tooth extraction    . Dilation and curettage of uterus    . Cesarean section  04/09/2011    Procedure: CESAREAN SECTION;  Surgeon: Loney Laurence;  Location: [facility];  Service: Gynecology;  Laterality: N/A;  .  Knee arthroscopy      History   Social History  . Marital Status: Single    Spouse Name: N/A    Number of Children: N/A  . Years of Education: N/A   Social History Main Topics  . Smoking status: Never Smoker   . Smokeless tobacco: None  . Alcohol Use: No  . Drug Use: No  . Sexual Activity: Not Currently    Birth Control/ Protection: None     Comment: last sex one week ago   Other Topics Concern  . None   Social History Narrative  . None    Family History  Problem Relation Age of Onset  . Diabetes Mother   . Diabetes Maternal Grandmother   . Diabetes Maternal Grandfather   . Diabetes Paternal Grandmother   . Stroke Paternal Grandmother    In addition, the patient has no family history of mental retardation, birth defects, or genetic diseases.  Physical Examination Filed Vitals:   02/08/14 0827  BP: 130/60  Pulse: 87   General appearance - alert, well appearing, and in no distress Abdomen - soft, nontender, nondistended, no masses or organomegaly Extremities - no  pedal edema noted  Assessment and Recommendations 1.  Prior history of early onset severe preeclampsia and fetal growth restriction.  Given the severity and very early onset (24 weeks) the risk of recurrence is rather high in Ms. Weikel's case.  She is already taking a baby aspirin which is the only proven therapy to reduce the risk of recurrent preeclampsia.  Thus, no further treatment is required.  Although Ms. Dillen reports normal BP's between pregnancies, I suspect she has some underlying hypertension and secondary vascular disease.  As such a CBC, AST, ALT, 24 hour urine collection, and serum creatinine should be performed to determine her baseline creatinine clearance and proteinuria.  Also given the very early onset of the fetal growth restriction Ms. Parrella should have serial growth scans every 4-6 weeks after her anatomic survey at approximately 18 weeks.  Once or twice weekly fetal surveillance  should be started at 32 weeks, or early if fetal growth restriction develops prior to that..  I spent 20 minutes with Ms. Chicoine today of which 50% was face-to-face counseling.  Thank you for referring Ms. Rensch to the [facility].  Please do not hesitate to contact us with questions.   Rema Fendt, MD

## 2014-02-11 LAB — OB RESULTS CONSOLE RPR: RPR: NONREACTIVE

## 2014-02-17 ENCOUNTER — Other Ambulatory Visit (HOSPITAL_COMMUNITY): Payer: Medicaid Other

## 2014-02-18 ENCOUNTER — Encounter (HOSPITAL_COMMUNITY): Payer: Self-pay

## 2014-02-18 ENCOUNTER — Ambulatory Visit (HOSPITAL_COMMUNITY): Payer: MEDICAID

## 2014-02-18 ENCOUNTER — Ambulatory Visit (HOSPITAL_COMMUNITY): Payer: Self-pay

## 2014-02-20 ENCOUNTER — Inpatient Hospital Stay (HOSPITAL_COMMUNITY)
Admission: AD | Admit: 2014-02-20 | Discharge: 2014-02-20 | Disposition: A | Discharge status: Home / Self Care | Payer: Medicaid Other | Source: Ambulatory Visit | Attending: Obstetrics and Gynecology | Admitting: Obstetrics and Gynecology

## 2014-02-20 ENCOUNTER — Encounter (HOSPITAL_COMMUNITY): Payer: Self-pay

## 2014-02-20 DIAGNOSIS — O99891 Other specified diseases and conditions complicating pregnancy: Secondary | ICD-10-CM | POA: Insufficient documentation

## 2014-02-20 DIAGNOSIS — M5432 Sciatica, left side: Secondary | ICD-10-CM

## 2014-02-20 DIAGNOSIS — M545 Low back pain, unspecified: Secondary | ICD-10-CM | POA: Insufficient documentation

## 2014-02-20 DIAGNOSIS — M5430 Sciatica, unspecified side: Secondary | ICD-10-CM | POA: Diagnosis not present

## 2014-02-20 DIAGNOSIS — O9989 Other specified diseases and conditions complicating pregnancy, childbirth and the puerperium: Principal | ICD-10-CM

## 2014-02-20 LAB — URINALYSIS, ROUTINE W REFLEX MICROSCOPIC
BILIRUBIN URINE: NEGATIVE
Glucose, UA: NEGATIVE mg/dL
Hgb urine dipstick: NEGATIVE
KETONES UR: NEGATIVE mg/dL
Nitrite: NEGATIVE
PH: 7.5 (ref 5.0–8.0)
PROTEIN: NEGATIVE mg/dL
Specific Gravity, Urine: 1.015 (ref 1.005–1.030)
Urobilinogen, UA: 0.2 mg/dL (ref 0.0–1.0)

## 2014-02-20 LAB — URINE MICROSCOPIC-ADD ON

## 2014-02-20 NOTE — MAU Provider Note (Signed)
History     CSN: [id number]  Arrival date and time: 02/20/14 1346   None     Chief Complaint  Patient presents with  . back spasms    HPI This is a 23 y.o. female at [redacted]w[redacted]d who presents with c/o Left lower back pain, which is sharp in nature, and hurts more with weight-bearing. Points to sciatic joint.  Denies paresthesia or weakness. Denies other complaints.  States this has been bothering her for several days.   RN Note;  Pt presents to MAU with complaints of having back spasms in her left lower side of her back. Denies any vaginal bleeding or abdominal cramping       OB History   Grav Para Term Preterm Abortions TAB SAB Ect Mult Living        Past Medical History  Diagnosis Date  . Ovarian cyst     left side  . Hypertension   . Obesity   . Pregnant     Past Surgical History  Procedure Laterality Date  . Tonsillectomy    . Wisdom tooth extraction    . Dilation and curettage of uterus    . Cesarean section  04/09/2011    Procedure: CESAREAN SECTION;  Surgeon: Loney Laurence;  Location: [facility];  Service: Gynecology;  Laterality: N/A;  . Knee arthroscopy      Family History  Problem Relation Age of Onset  . Diabetes Mother   . Diabetes Maternal Grandmother   . Diabetes Maternal Grandfather   . Diabetes Paternal Grandmother   . Stroke Paternal Grandmother     History  Substance Use Topics  . Smoking status: Never Smoker   . Smokeless tobacco: Not on file  . Alcohol Use: No    Allergies:  Allergies  Allergen Reactions  . Lemon Juice Itching and Other (See Comments)    Tongue swelling  . Orange Juice Itching and Other (See Comments)    Tongue swelling  . Citrus Itching and Other (See Comments)    Tongue swelling  . Penicillins Itching  . Sulfa Antibiotics Itching  . Amoxicillin Rash  . Latex Itching and Rash    Prescriptions prior to admission  Medication Sig Dispense Refill  . clindamycin (CLEOCIN) 300 MG capsule  Take 300 mg by mouth 3 (three) times daily.        Review of Systems  Constitutional: Negative for fever, chills and malaise/fatigue.  Gastrointestinal: Negative for nausea, vomiting and abdominal pain.  Musculoskeletal: Positive for back pain and joint pain (left sciatic).  Neurological: Negative for focal weakness.   Physical Exam   Blood pressure 147/93, pulse 91, temperature 98.3 F (36.8 C), height  (1.702 m), weight 162.388 kg (358 lb), last menstrual period 10/24/2013.  Physical Exam  Constitutional: She is oriented to person, place, and time. She appears well-developed and well-nourished. No distress.  Cardiovascular: Normal rate.   Respiratory: Effort normal.  Musculoskeletal: Normal range of motion. She exhibits tenderness (over left sciatic joint).  Gait mostly normal with slight limp. Normal foot dorsi- and plantar flexion Normal sensation  Neurological: She is alert and oriented to person, place, and time.  Skin: Skin is warm and dry.  Psychiatric: She has a normal mood and affect.   Unable to doppler FHTs. Bedside US done to confirm FHTs 150s. Good FM noted.  MAU Course  Procedures   Assessment and Plan  A:  SIUP at [redacted]w[redacted]d  Left Sciatic pain  P;  Discussed with Dr Claiborne Billings       Recommend stretching and gentle exercise       May benefit from PT referral, will ask at office       Will try heel lift in Left shoe       Tylenol for pain  [facility] 02/20/2014, 3:39 PM

## 2014-02-20 NOTE — Discharge Instructions (Signed)

## 2014-02-20 NOTE — MAU Note (Signed)
Pt presents to MAU with complaints of having back spasms in her left lower side of her back. Denies any vaginal bleeding or abdominal cramping

## 2014-03-03 ENCOUNTER — Ambulatory Visit (HOSPITAL_COMMUNITY)
Admission: RE | Admit: 2014-03-03 | Discharge: 2014-03-03 | Disposition: A | Discharge status: Home / Self Care | Payer: Medicaid Other | Source: Ambulatory Visit | Attending: Obstetrics | Admitting: Obstetrics

## 2014-03-03 ENCOUNTER — Encounter (HOSPITAL_COMMUNITY): Payer: Self-pay

## 2014-03-03 ENCOUNTER — Ambulatory Visit (HOSPITAL_COMMUNITY): Admission: RE | Admit: 2014-03-03 | Payer: Medicaid Other | Source: Ambulatory Visit

## 2014-03-03 VITALS — BP 136/84 | HR 86 | Wt 358.8 lb

## 2014-03-03 DIAGNOSIS — O09299 Supervision of pregnancy with other poor reproductive or obstetric history, unspecified trimester: Secondary | ICD-10-CM | POA: Diagnosis not present

## 2014-03-03 DIAGNOSIS — O99212 Obesity complicating pregnancy, second trimester: Secondary | ICD-10-CM

## 2014-03-03 DIAGNOSIS — Z3689 Encounter for other specified antenatal screening: Secondary | ICD-10-CM | POA: Insufficient documentation

## 2014-03-03 DIAGNOSIS — O99210 Obesity complicating pregnancy, unspecified trimester: Principal | ICD-10-CM

## 2014-03-03 DIAGNOSIS — E669 Obesity, unspecified: Secondary | ICD-10-CM | POA: Diagnosis not present

## 2014-03-04 ENCOUNTER — Encounter (HOSPITAL_COMMUNITY): Payer: Self-pay

## 2014-03-08 ENCOUNTER — Ambulatory Visit (HOSPITAL_COMMUNITY)
Admission: RE | Admit: 2014-03-08 | Discharge: 2014-03-08 | Disposition: A | Discharge status: Home / Self Care | Payer: Medicaid Other | Source: Ambulatory Visit | Attending: Obstetrics | Admitting: Obstetrics

## 2014-03-08 ENCOUNTER — Other Ambulatory Visit: Payer: Self-pay

## 2014-03-08 ENCOUNTER — Other Ambulatory Visit (HOSPITAL_COMMUNITY): Payer: Medicaid Other

## 2014-03-08 DIAGNOSIS — Z361 Encounter for antenatal screening for raised alphafetoprotein level: Secondary | ICD-10-CM | POA: Insufficient documentation

## 2014-03-08 LAB — QUAD SCREEN FOR MFM

## 2014-03-14 ENCOUNTER — Other Ambulatory Visit: Payer: Self-pay | Admitting: Obstetrics and Gynecology

## 2014-04-02 ENCOUNTER — Emergency Department (HOSPITAL_BASED_OUTPATIENT_CLINIC_OR_DEPARTMENT_OTHER)
Admission: EM | Admit: 2014-04-02 | Discharge: 2014-04-02 | Disposition: A | Discharge status: Home / Self Care | Payer: Medicaid Other | Attending: Emergency Medicine | Admitting: Emergency Medicine

## 2014-04-02 ENCOUNTER — Encounter (HOSPITAL_BASED_OUTPATIENT_CLINIC_OR_DEPARTMENT_OTHER): Payer: Self-pay | Admitting: Emergency Medicine

## 2014-04-02 DIAGNOSIS — O99512 Diseases of the respiratory system complicating pregnancy, second trimester: Secondary | ICD-10-CM | POA: Insufficient documentation

## 2014-04-02 DIAGNOSIS — Z3A23 23 weeks gestation of pregnancy: Secondary | ICD-10-CM | POA: Diagnosis not present

## 2014-04-02 DIAGNOSIS — Z9889 Other specified postprocedural states: Secondary | ICD-10-CM | POA: Diagnosis not present

## 2014-04-02 DIAGNOSIS — Z880 Allergy status to penicillin: Secondary | ICD-10-CM | POA: Insufficient documentation

## 2014-04-02 DIAGNOSIS — Z792 Long term (current) use of antibiotics: Secondary | ICD-10-CM | POA: Insufficient documentation

## 2014-04-02 DIAGNOSIS — O10012 Pre-existing essential hypertension complicating pregnancy, second trimester: Secondary | ICD-10-CM | POA: Diagnosis not present

## 2014-04-02 DIAGNOSIS — O99212 Obesity complicating pregnancy, second trimester: Secondary | ICD-10-CM | POA: Insufficient documentation

## 2014-04-02 DIAGNOSIS — O9989 Other specified diseases and conditions complicating pregnancy, childbirth and the puerperium: Secondary | ICD-10-CM | POA: Insufficient documentation

## 2014-04-02 DIAGNOSIS — J069 Acute upper respiratory infection, unspecified: Secondary | ICD-10-CM

## 2014-04-02 DIAGNOSIS — Z79899 Other long term (current) drug therapy: Secondary | ICD-10-CM | POA: Insufficient documentation

## 2014-04-02 DIAGNOSIS — Z7982 Long term (current) use of aspirin: Secondary | ICD-10-CM | POA: Insufficient documentation

## 2014-04-02 DIAGNOSIS — H9203 Otalgia, bilateral: Secondary | ICD-10-CM | POA: Diagnosis not present

## 2014-04-02 NOTE — Discharge Instructions (Signed)
Get lots of rest and stay well hydrated. Continue nasal saline rinses. Tylenol as needed.  Cool Mist Vaporizers Vaporizers may help relieve the symptoms of a cough and cold. They add moisture to the air, which helps mucus to become thinner and less sticky. This makes it easier to breathe and cough up secretions. Cool mist vaporizers do not cause serious burns like hot mist vaporizers, which may also be called steamers or humidifiers. Vaporizers have not been proven to help with colds. You should not use a vaporizer if you are allergic to mold. HOME CARE INSTRUCTIONS  Follow the package instructions for the vaporizer.  Do not use anything other than distilled water in the vaporizer.  Do not run the vaporizer all of the time. This can cause mold or bacteria to grow in the vaporizer.  Clean the vaporizer after each time it is used.  Clean and dry the vaporizer well before storing it.  Stop using the vaporizer if worsening respiratory symptoms develop. Document Released: 02/29/2004 Document Revised: 06/08/2013 Document Reviewed: 10/21/2012 [facility]ExitCare Patient Information 2015 [place], [place]. This information is not intended to replace advice given to you by your health care provider. Make sure you discuss any questions you have with your health care provider.  Upper Respiratory Infection, Adult An upper respiratory infection (URI) is also sometimes known as the common cold. The upper respiratory tract includes the nose, sinuses, throat, trachea, and bronchi. Bronchi are the airways leading to the lungs. Most people improve within 1 week, but symptoms can last up to 2 weeks. A residual cough may last even longer.  CAUSES Many different viruses can infect the tissues lining the upper respiratory tract. The tissues become irritated and inflamed and often become very moist. Mucus production is also common. A cold is contagious. You can easily spread the virus to others by oral contact. This includes  kissing, sharing a glass, coughing, or sneezing. Touching your mouth or nose and then touching a surface, which is then touched by another person, can also spread the virus. SYMPTOMS  Symptoms typically develop 1 to 3 days after you come in contact with a cold virus. Symptoms vary from person to person. They may include:  Runny nose.  Sneezing.  Nasal congestion.  Sinus irritation.  Sore throat.  Loss of voice (laryngitis).  Cough.  Fatigue.  Muscle aches.  Loss of appetite.  Headache.  Low-grade fever. DIAGNOSIS  You might diagnose your own cold based on familiar symptoms, since most people get a cold 2 to 3 times a year. Your caregiver can confirm this based on your exam. Most importantly, your caregiver can check that your symptoms are not due to another disease such as strep throat, sinusitis, pneumonia, asthma, or epiglottitis. Blood tests, throat tests, and X-rays are not necessary to diagnose a common cold, but they may sometimes be helpful in excluding other more serious diseases. Your caregiver will decide if any further tests are required. RISKS AND COMPLICATIONS  You may be at risk for a more severe case of the common cold if you smoke cigarettes, have chronic heart disease (such as heart failure) or lung disease (such as asthma), or if you have a weakened immune system. The very young and very old are also at risk for more serious infections. Bacterial sinusitis, middle ear infections, and bacterial pneumonia can complicate the common cold. The common cold can worsen asthma and chronic obstructive pulmonary disease (COPD). Sometimes, these complications can require emergency medical care and may be life-threatening.  PREVENTION  The best way to protect against getting a cold is to practice good hygiene. Avoid oral or hand contact with people with cold symptoms. Wash your hands often if contact occurs. There is no clear evidence that vitamin C, vitamin E, echinacea, or  exercise reduces the chance of developing a cold. However, it is always recommended to get plenty of rest and practice good nutrition. TREATMENT  Treatment is directed at relieving symptoms. There is no cure. Antibiotics are not effective, because the infection is caused by a virus, not by bacteria. Treatment may include:  Increased fluid intake. Sports drinks offer valuable electrolytes, sugars, and fluids.  Breathing heated mist or steam (vaporizer or shower).  Eating chicken soup or other clear broths, and maintaining good nutrition.  Getting plenty of rest.  Using gargles or lozenges for comfort.  Controlling fevers with ibuprofen or acetaminophen as directed by your caregiver.  Increasing usage of your inhaler if you have asthma. Zinc gel and zinc lozenges, taken in the first 24 hours of the common cold, can shorten the duration and lessen the severity of symptoms. Pain medicines may help with fever, muscle aches, and throat pain. A variety of non-prescription medicines are available to treat congestion and runny nose. Your caregiver can make recommendations and may suggest nasal or lung inhalers for other symptoms.  HOME CARE INSTRUCTIONS   Only take over-the-counter or prescription medicines for pain, discomfort, or fever as directed by your caregiver.  Use a warm mist humidifier or inhale steam from a shower to increase air moisture. This may keep secretions moist and make it easier to breathe.  Drink enough water and fluids to keep your urine clear or pale yellow.  Rest as needed.  Return to work when your temperature has returned to normal or as your caregiver advises. You may need to stay home longer to avoid infecting others. You can also use a face mask and careful hand washing to prevent spread of the virus. SEEK MEDICAL CARE IF:   After the first few days, you feel you are getting worse rather than better.  You need your caregiver's advice about medicines to control  symptoms.  You develop chills, worsening shortness of breath, or brown or red sputum. These may be signs of pneumonia.  You develop yellow or brown nasal discharge or pain in the face, especially when you bend forward. These may be signs of sinusitis.  You develop a fever, swollen neck glands, pain with swallowing, or white areas in the back of your throat. These may be signs of strep throat. SEEK IMMEDIATE MEDICAL CARE IF:   You have a fever.  You develop severe or persistent headache, ear pain, sinus pain, or chest pain.  You develop wheezing, a prolonged cough, cough up blood, or have a change in your usual mucus (if you have chronic lung disease).  You develop sore muscles or a stiff neck. Document Released: 11/27/2000 Document Revised: 08/26/2011 Document Reviewed: 09/08/2013 [facility] CenterExit[facility] Patient Information 2015 [place]ExitCare, [place]LLC. This information is not intended to replace advice given to you by your health care provider. Make sure you discuss any questions you have with your health care provider.

## 2014-04-02 NOTE — ED Provider Notes (Signed)
CSN: [id number]     Arrival date & time 04/02/14  1248 History   First MD Initiated Contact with Patient 04/02/14 1338     Chief Complaint  Patient presents with  . Fever     (Consider location/radiation/quality/duration/timing/severity/associated sxs/prior Treatment) HPI Comments: This is a 23 year old obese female who is [redacted] weeks pregnant who presents to the emergency department complaining of bilateral ear pain, sinus congestion, body aches, fever and chills x4 days. Tmax 103.1 four days ago. She has been taking Tylenol, last dose was about 2 hours ago. She has also been using nasal saline. States she just "feels like crap". Her daughter was recently diagnosed with an ear infection. Denies nausea, vomiting or diarrhea.  Patient is a [age] y.o. female presenting with fever. The history is provided by the patient.  Fever Associated symptoms: chills, congestion, ear pain and myalgias     Past Medical History  Diagnosis Date  . Ovarian cyst     left side  . Hypertension   . Obesity   . Pregnant    Past Surgical History  Procedure Laterality Date  . Tonsillectomy    . Wisdom tooth extraction    . Dilation and curettage of uterus    . Cesarean section  04/09/2011    Procedure: CESAREAN SECTION;  Surgeon: Loney LaurenceMichelle A Horvath;  Location: [facility];  Service: Gynecology;  Laterality: N/A;  . Knee arthroscopy     Family History  Problem Relation Age of Onset  . Diabetes Mother   . Diabetes Maternal Grandmother   . Diabetes Maternal Grandfather   . Diabetes Paternal Grandmother   . Stroke Paternal Grandmother    History  Substance Use Topics  . Smoking status: Never Smoker   . Smokeless tobacco: Not on file  . Alcohol Use: No   OB History   Grav Para Term Preterm Abortions TAB SAB Ect Mult Living   5 1 0 1 3 1 1 1 0 1      Review of Systems  Constitutional: Positive for fever and chills.  HENT: Positive for congestion, ear pain and sinus pressure.   Musculoskeletal: Positive  for arthralgias and myalgias.  All other systems reviewed and are negative.     Allergies  Lemon juice; Orange juice; Citrus; Penicillins; Sulfa antibiotics; Amoxicillin; and Latex  Home Medications   Prior to Admission medications   Medication Sig Start Date End Date Taking? Authorizing Provider  ASPIRIN PO Take by mouth.    Historical Provider, MD  Calcium Carbonate Antacid (TUMS PO) Take by mouth.    Historical Provider, MD  clindamycin (CLEOCIN) 300 MG capsule Take 300 mg by mouth 3 (three) times daily.    Historical Provider, MD  Prenatal Vit w/Fe-Methylfol-FA (PNV PO) Take by mouth.    Historical Provider, MD   BP 127/70  Pulse 96  Temp(Src) 97.7 F (36.5 C) (Oral)  Resp 20  Ht 5\' 8"  (1.727 m)  Wt 354 lb (160.573 kg)  BMI 53.84 kg/m2  SpO2 99%  LMP 10/24/2013 Physical Exam  Nursing note and vitals reviewed. Constitutional: She is oriented to person, place, and time. She appears well-developed and well-nourished. No distress.  Morbidly obese.  HENT:  Head: Normocephalic and atraumatic.  Nasal congestion, mucosal edema, post nasal drip. Bilateral retracted TMs, no erythema or injection.  Eyes: Conjunctivae are normal.  Neck: Normal range of motion. Neck supple.  Cardiovascular: Normal rate, regular rhythm and normal heart sounds.   Pulmonary/Chest: Effort normal and breath sounds normal.  Abdominal:  Soft. Bowel sounds are normal. There is no tenderness.  Musculoskeletal: Normal range of motion. She exhibits no edema.  Neurological: She is alert and oriented to person, place, and time.  Skin: Skin is warm and dry. She is not diaphoretic.  Psychiatric: She has a normal mood and affect. Her behavior is normal.    ED Course  Procedures (including critical care time) Labs Review Labs Reviewed - No data to display  Imaging Review No results found.   EKG Interpretation None      MDM   Final diagnoses:  URI (upper respiratory infection)   Patient nontoxic  appearing and in no apparent distress. Afebrile, vital signs stable. Lungs clear. No signs of otitis media. I do not feel antibiotics are appropriate at this time as the risk will outweigh the benefit as she is pregnant. Discussed symptomatic treatment. Followup with PCP. Stable for discharge. Return precautions given. Patient states understanding of treatment care plan and is agreeable.  Kathrynn SpeedRobyn M Onie Hayashi, PA-C 04/02/14 1408

## 2014-04-02 NOTE — ED Notes (Signed)
Pt is [redacted] weeks pregnant and has been running a fever x4 days. She sts at its highest was 103. Pt also c/o sinus congestion, body aches, chills, and bilat ear pain.

## 2014-04-06 NOTE — ED Provider Notes (Signed)
Medical screening examination/treatment/procedure(s) were performed by non-physician practitioner and as supervising physician I was immediately available for consultation/collaboration.   EKG Interpretation None        Rolland PorterMark Sanav Remer, MD 04/06/14 [phone]

## 2014-04-07 ENCOUNTER — Other Ambulatory Visit (HOSPITAL_COMMUNITY): Payer: Self-pay | Admitting: Maternal and Fetal Medicine

## 2014-04-07 DIAGNOSIS — O09292 Supervision of pregnancy with other poor reproductive or obstetric history, second trimester: Secondary | ICD-10-CM

## 2014-04-14 ENCOUNTER — Ambulatory Visit (HOSPITAL_COMMUNITY)
Admission: RE | Admit: 2014-04-14 | Discharge: 2014-04-14 | Disposition: A | Discharge status: Home / Self Care | Payer: Medicaid Other | Source: Ambulatory Visit | Attending: Obstetrics | Admitting: Obstetrics

## 2014-04-14 ENCOUNTER — Other Ambulatory Visit (HOSPITAL_COMMUNITY): Payer: Self-pay | Admitting: Maternal and Fetal Medicine

## 2014-04-14 ENCOUNTER — Encounter (HOSPITAL_COMMUNITY): Payer: Self-pay

## 2014-04-14 VITALS — BP 141/78 | HR 89 | Wt 351.0 lb

## 2014-04-14 DIAGNOSIS — Z36 Encounter for antenatal screening of mother: Secondary | ICD-10-CM | POA: Insufficient documentation

## 2014-04-14 DIAGNOSIS — O09292 Supervision of pregnancy with other poor reproductive or obstetric history, second trimester: Secondary | ICD-10-CM

## 2014-04-14 DIAGNOSIS — E669 Obesity, unspecified: Secondary | ICD-10-CM | POA: Insufficient documentation

## 2014-04-14 DIAGNOSIS — Z0489 Encounter for examination and observation for other specified reasons: Secondary | ICD-10-CM

## 2014-04-14 DIAGNOSIS — IMO0002 Reserved for concepts with insufficient information to code with codable children: Secondary | ICD-10-CM

## 2014-04-14 DIAGNOSIS — Z3A24 24 weeks gestation of pregnancy: Secondary | ICD-10-CM | POA: Diagnosis not present

## 2014-04-14 DIAGNOSIS — O99212 Obesity complicating pregnancy, second trimester: Secondary | ICD-10-CM | POA: Diagnosis not present

## 2014-04-14 NOTE — ED Notes (Signed)
Pt reports increased wetness x 1 wk.

## 2014-04-18 ENCOUNTER — Encounter (HOSPITAL_COMMUNITY): Payer: Self-pay

## 2014-04-25 ENCOUNTER — Inpatient Hospital Stay (HOSPITAL_COMMUNITY)
Admission: AD | Admit: 2014-04-25 | Discharge: 2014-04-25 | Disposition: A | Discharge status: Home / Self Care | Payer: Medicaid Other | Source: Ambulatory Visit | Attending: Obstetrics | Admitting: Obstetrics

## 2014-04-25 ENCOUNTER — Encounter (HOSPITAL_COMMUNITY): Payer: Self-pay | Admitting: *Deleted

## 2014-04-25 DIAGNOSIS — O479 False labor, unspecified: Secondary | ICD-10-CM

## 2014-04-25 DIAGNOSIS — E860 Dehydration: Secondary | ICD-10-CM

## 2014-04-25 DIAGNOSIS — R1030 Lower abdominal pain, unspecified: Secondary | ICD-10-CM | POA: Insufficient documentation

## 2014-04-25 DIAGNOSIS — O9989 Other specified diseases and conditions complicating pregnancy, childbirth and the puerperium: Secondary | ICD-10-CM | POA: Diagnosis present

## 2014-04-25 DIAGNOSIS — Z3A26 26 weeks gestation of pregnancy: Secondary | ICD-10-CM | POA: Diagnosis not present

## 2014-04-25 LAB — WET PREP, GENITAL
Clue Cells Wet Prep HPF POC: NONE SEEN
TRICH WET PREP: NONE SEEN
YEAST WET PREP: NONE SEEN

## 2014-04-25 LAB — AMNISURE RUPTURE OF MEMBRANE (ROM) NOT AT ARMC: Amnisure ROM: NEGATIVE

## 2014-04-25 LAB — URINALYSIS, ROUTINE W REFLEX MICROSCOPIC
Bilirubin Urine: NEGATIVE
Glucose, UA: NEGATIVE mg/dL
Hgb urine dipstick: NEGATIVE
Ketones, ur: >80 mg/dL — AB
LEUKOCYTES UA: NEGATIVE
Nitrite: NEGATIVE
PROTEIN: NEGATIVE mg/dL
UROBILINOGEN UA: 1 mg/dL (ref 0.0–1.0)
pH: 6.5 (ref 5.0–8.0)

## 2014-04-25 MED ORDER — LACTATED RINGERS IV BOLUS (SEPSIS)
1000.0000 mL | Freq: Once | INTRAVENOUS | Status: AC
  Administered 2014-04-25: 1000 mL via INTRAVENOUS

## 2014-04-25 NOTE — MAU Provider Note (Signed)
Chief Complaint:  Abdominal Cramping and Vaginal Discharge   First Provider Initiated Contact with Patient 04/25/14 1746      HPI: Kimberly [name] is a 23 y.o. [id number] at 426w1dwho presents to maternity admissions reporting abdominal cramping and vaginal discharge.  She reports intermittent cramping starting this afternoon and becoming more uncomfortable.  She has clear discharge, not requiring a pad but soaking her underwear x 2 today.  She has hx of preterm birth but this was C/S for severe preeclampsia at 39101w5d. She reports some nausea today, keeping her from drinking more than 2-3 glasses of water.  She reports good fetal movement, denies vaginal bleeding, vaginal itching/burning, urinary symptoms, h/a, visual disturbances, epigastric pain, dizziness, vomitting, or fever/chills.     Past Medical History: Past Medical History  Diagnosis Date  . Ovarian cyst     left side  . Hypertension   . Obesity   . Pregnant     Past obstetric history: OB History  Gravida Para Term Preterm AB SAB TAB Ectopic Multiple Living  5 1 0 1 3 1 1 1 0 1     # Outcome Date GA Lbr Len/2nd Weight Sex Delivery Anes PTL Lv  5 Current           4 Preterm 04/09/11 43101w5d  0.86 kg (1 lb 14.3 oz) F CS-LTranv Spinal  Y  3 Ectopic           2 SAB           1 TAB               Past Surgical History: Past Surgical History  Procedure Laterality Date  . Tonsillectomy    . Wisdom tooth extraction    . Dilation and curettage of uterus    . Cesarean section  04/09/2011    Procedure: CESAREAN SECTION;  Surgeon: Loney LaurenceMichelle A Horvath;  Location: [facility];  Service: Gynecology;  Laterality: N/A;  . Knee arthroscopy      Family History: Family History  Problem Relation Age of Onset  . Diabetes Mother   . Diabetes Maternal Grandmother   . Diabetes Maternal Grandfather   . Diabetes Paternal Grandmother   . Stroke Paternal Grandmother     Social History: History  Substance Use Topics  . Smoking status: Never  Smoker   . Smokeless tobacco: Not on file  . Alcohol Use: No    Allergies:  Allergies  Allergen Reactions  . Lemon Juice Itching and Other (See Comments)    Tongue swelling  . Orange Juice Itching and Other (See Comments)    Tongue swelling  . Citrus Itching and Other (See Comments)    Tongue swelling  . Penicillins Itching  . Sulfa Antibiotics Itching  . Amoxicillin Rash  . Latex Itching and Rash    Meds:  No prescriptions prior to admission    ROS: Pertinent findings in history of present illness.  Physical Exam  Blood pressure 110/81, pulse 96, temperature 98.4 F (36.9 C), temperature source Oral, resp. rate 20, height 5\' 8"  (1.727 m), weight 161.481 kg (356 lb), last menstrual period 10/24/2013, SpO2 96 %. GENERAL: Well-developed, well-nourished female in no acute distress.  HEENT: normocephalic HEART: normal rate RESP: normal effort ABDOMEN: Soft, non-tender, gravid appropriate for gestational age EXTREMITIES: Nontender, no edema NEURO: alert and oriented Pelvic exam: Cervix pink, visually closed, without lesion,negative pooling of fluid, large amount thin white discharge, vaginal walls and external genitalia normal  Dilation: Closed Effacement (%): Thick Cervical  Position: Posterior Exam by:: L. Leftwhich-Kerby, CNM  FHT:  Baseline 145 , moderate variability, accelerations present, no decelerations Contractions: none on toco or to palpation   Labs:  Results for orders placed or performed during the hospital encounter of 04/25/14 (from the past 336 hour(s))  Urinalysis, Routine w reflex microscopic   Collection Time: 04/25/14  4:56 PM  Result Value Ref Range   Color, Urine YELLOW YELLOW   APPearance HAZY (A) CLEAR   Specific Gravity, Urine >1.030 (H) 1.005 - 1.030   pH 6.5 5.0 - 8.0   Glucose, UA NEGATIVE NEGATIVE mg/dL   Hgb urine dipstick NEGATIVE NEGATIVE   Bilirubin Urine NEGATIVE NEGATIVE   Ketones, ur >80 (A) NEGATIVE mg/dL   Protein, ur  NEGATIVE NEGATIVE mg/dL   Urobilinogen, UA 1.0 0.0 - 1.0 mg/dL   Nitrite NEGATIVE NEGATIVE   Leukocytes, UA NEGATIVE NEGATIVE  Wet prep, genital   Collection Time: 04/25/14  5:55 PM  Result Value Ref Range   Yeast Wet Prep HPF POC NONE SEEN NONE SEEN   Trich, Wet Prep NONE SEEN NONE SEEN   Clue Cells Wet Prep HPF POC NONE SEEN NONE SEEN   WBC, Wet Prep HPF POC FEW (A) NONE SEEN  GC/Chlamydia Probe Amp   Collection Time: 04/25/14  5:55 PM  Result Value Ref Range   CT Probe RNA NEGATIVE NEGATIVE   GC Probe RNA NEGATIVE NEGATIVE  Amnisure rupture of membrane (rom)   Collection Time: 04/25/14  6:10 PM  Result Value Ref Range   Amnisure ROM NEGATIVE     Assessment: 1. Braxton Hicks contractions   2. Mild dehydration     Plan: Consult Dr Chestine Sporelark Discharge home PTL precautions and fetal kick counts Take nausea medication as needed to increase PO fluids      Follow-up Information    Follow up with Marlow BaarsLARK, DYANNA, MD.   Specialty:  Obstetrics   Why:  Follow up this week in the office for blood pressure check and evaluation of cramping.   Contact information:   [address] [place] [id number] [phone]       [facility] [place] [facility].   Why:  As needed for emergencies   Contact information:   [address] [id number] mc [place] [id number] [phone]       Medication List    STOP taking these medications        clindamycin 300 MG capsule  Commonly known as:  CLEOCIN      TAKE these medications        ASPIRIN PO  Take by mouth.     nitrofurantoin 100 MG capsule  Commonly known as:  MACRODANTIN  Take 100 mg by mouth 2 (two) times daily.     PNV PO  Take by mouth.     TUMS PO  Take 2-4 tablets by mouth daily as needed (indigestion & heartburn.).        Sharen CounterLisa Leftwich-Kirby Certified Nurse-Midwife 05/04/2014 8:22 AM

## 2014-04-25 NOTE — MAU Note (Addendum)
Pt states she has been having lower abd cramping x 3 hours.  Denies vaginal bleeding but has had clear watery discharge twice today requiring her to change her panties.  Good fetal movement.  Pt was 04-19-14 with UTI and placed on antibiotics.

## 2014-04-25 NOTE — Discharge Instructions (Signed)
Braxton Hicks Contractions °Contractions of the uterus can occur throughout pregnancy. Contractions are not always a sign that you are in labor.  °WHAT ARE BRAXTON HICKS CONTRACTIONS?  °Contractions that occur before labor are called Braxton Hicks contractions, or false labor. Toward the end of pregnancy (32-34 weeks), these contractions can develop more often and may become more forceful. This is not true labor because these contractions do not result in opening (dilatation) and thinning of the cervix. They are sometimes difficult to tell apart from true labor because these contractions can be forceful and people have different pain tolerances. You should not feel embarrassed if you go to the hospital with false labor. Sometimes, the only way to tell if you are in true labor is for your health care provider to look for changes in the cervix. °If there are no prenatal problems or other health problems associated with the pregnancy, it is completely safe to be sent home with false labor and await the onset of true labor. °HOW CAN YOU TELL THE DIFFERENCE BETWEEN TRUE AND FALSE LABOR? °False Labor °· The contractions of false labor are usually shorter and not as hard as those of true labor.   °· The contractions are usually irregular.   °· The contractions are often felt in the front of the lower abdomen and in the groin.   °· The contractions may go away when you walk around or change positions while lying down.   °· The contractions get weaker and are shorter lasting as time goes on.   °· The contractions do not usually become progressively stronger, regular, and closer together as with true labor.   °True Labor °· Contractions in true labor last 30-70 seconds, become very regular, usually become more intense, and increase in frequency.   °· The contractions do not go away with walking.   °· The discomfort is usually felt in the top of the uterus and spreads to the lower abdomen and low back.   °· True labor can be  determined by your health care provider with an exam. This will show that the cervix is dilating and getting thinner.   °WHAT TO REMEMBER °· Keep up with your usual exercises and follow other instructions given by your health care provider.   °· Take medicines as directed by your health care provider.   °· Keep your regular prenatal appointments.   °· Eat and drink lightly if you think you are going into labor.   °· If Braxton Hicks contractions are making you uncomfortable:   °¨ Change your position from lying down or resting to walking, or from walking to resting.   °¨ Sit and rest in a tub of warm water.   °¨ Drink 2-3 glasses of water. Dehydration may cause these contractions.   °¨ Do slow and deep breathing several times an hour.   °WHEN SHOULD I SEEK IMMEDIATE MEDICAL CARE? °Seek immediate medical care if: °· Your contractions become stronger, more regular, and closer together.   °· You have fluid leaking or gushing from your vagina.   °· You have a fever.   °· You pass blood-tinged mucus.   °· You have vaginal bleeding.   °· You have continuous abdominal pain.   °· You have low back pain that you never had before.   °· You feel your baby's head pushing down and causing pelvic pressure.   °· Your baby is not moving as much as it used to.   °Document Released: 06/03/2005 Document Revised: 06/08/2013 Document Reviewed: 03/15/2013 °ExitCare® Patient Information ©2015 [employer], [employer]. This information is not intended to replace advice given to you by your health care   provider. Make sure you discuss any questions you have with your health care provider. ° °

## 2014-04-26 LAB — GC/CHLAMYDIA PROBE AMP
CT Probe RNA: NEGATIVE
GC Probe RNA: NEGATIVE

## 2014-05-16 ENCOUNTER — Other Ambulatory Visit (HOSPITAL_COMMUNITY): Payer: Self-pay | Admitting: Maternal and Fetal Medicine

## 2014-05-16 ENCOUNTER — Encounter (HOSPITAL_COMMUNITY): Payer: Self-pay

## 2014-05-16 ENCOUNTER — Ambulatory Visit (HOSPITAL_COMMUNITY)
Admission: RE | Admit: 2014-05-16 | Discharge: 2014-05-16 | Disposition: A | Discharge status: Home / Self Care | Payer: Medicaid Other | Source: Ambulatory Visit | Attending: Obstetrics | Admitting: Obstetrics

## 2014-05-16 DIAGNOSIS — O09293 Supervision of pregnancy with other poor reproductive or obstetric history, third trimester: Secondary | ICD-10-CM | POA: Insufficient documentation

## 2014-05-16 DIAGNOSIS — O99213 Obesity complicating pregnancy, third trimester: Secondary | ICD-10-CM | POA: Insufficient documentation

## 2014-05-16 DIAGNOSIS — O99210 Obesity complicating pregnancy, unspecified trimester: Secondary | ICD-10-CM

## 2014-05-16 DIAGNOSIS — O09292 Supervision of pregnancy with other poor reproductive or obstetric history, second trimester: Secondary | ICD-10-CM

## 2014-05-16 DIAGNOSIS — O3421 Maternal care for scar from previous cesarean delivery: Secondary | ICD-10-CM | POA: Insufficient documentation

## 2014-05-16 DIAGNOSIS — Z3A29 29 weeks gestation of pregnancy: Secondary | ICD-10-CM | POA: Diagnosis not present

## 2014-05-16 DIAGNOSIS — O09299 Supervision of pregnancy with other poor reproductive or obstetric history, unspecified trimester: Secondary | ICD-10-CM | POA: Insufficient documentation

## 2014-05-29 ENCOUNTER — Encounter (HOSPITAL_COMMUNITY): Payer: Self-pay

## 2014-05-29 ENCOUNTER — Inpatient Hospital Stay (HOSPITAL_COMMUNITY)
Admission: AD | Admit: 2014-05-29 | Discharge: 2014-05-29 | Disposition: A | Discharge status: Home / Self Care | Payer: Medicaid Other | Source: Ambulatory Visit | Attending: Obstetrics and Gynecology | Admitting: Obstetrics and Gynecology

## 2014-05-29 DIAGNOSIS — N898 Other specified noninflammatory disorders of vagina: Secondary | ICD-10-CM

## 2014-05-29 DIAGNOSIS — Z3A31 31 weeks gestation of pregnancy: Secondary | ICD-10-CM | POA: Insufficient documentation

## 2014-05-29 DIAGNOSIS — O9989 Other specified diseases and conditions complicating pregnancy, childbirth and the puerperium: Secondary | ICD-10-CM | POA: Insufficient documentation

## 2014-05-29 LAB — URINALYSIS, ROUTINE W REFLEX MICROSCOPIC
Bilirubin Urine: NEGATIVE
GLUCOSE, UA: NEGATIVE mg/dL
Hgb urine dipstick: NEGATIVE
Ketones, ur: 15 mg/dL — AB
Nitrite: NEGATIVE
PROTEIN: NEGATIVE mg/dL
Specific Gravity, Urine: 1.025 (ref 1.005–1.030)
Urobilinogen, UA: 0.2 mg/dL (ref 0.0–1.0)
pH: 6 (ref 5.0–8.0)

## 2014-05-29 LAB — COMPREHENSIVE METABOLIC PANEL
ALT: 11 U/L (ref 0–35)
AST: 16 U/L (ref 0–37)
Albumin: 2.6 g/dL — ABNORMAL LOW (ref 3.5–5.2)
Alkaline Phosphatase: 122 U/L — ABNORMAL HIGH (ref 39–117)
Anion gap: 14 (ref 5–15)
BUN: 5 mg/dL — ABNORMAL LOW (ref 6–23)
CO2: 21 mEq/L (ref 19–32)
Calcium: 9.1 mg/dL (ref 8.4–10.5)
Chloride: 101 mEq/L (ref 96–112)
Creatinine, Ser: 0.48 mg/dL — ABNORMAL LOW (ref 0.50–1.10)
GFR calc Af Amer: >90 mL/min (ref >90)
GFR calc non Af Amer: >90 mL/min (ref >90)
Glucose, Bld: 116 mg/dL — ABNORMAL HIGH (ref 70–99)
Potassium: 3.6 mEq/L — ABNORMAL LOW (ref 3.7–5.3)
Sodium: 136 mEq/L — ABNORMAL LOW (ref 137–147)
Total Bilirubin: 0.4 mg/dL (ref 0.3–1.2)
Total Protein: 6.3 g/dL (ref 6.0–8.3)

## 2014-05-29 LAB — URINE MICROSCOPIC-ADD ON

## 2014-05-29 LAB — PROTEIN / CREATININE RATIO, URINE
Creatinine, Urine: 248.97 mg/dL
Protein Creatinine Ratio: 0.1 (ref 0.00–0.15)
Total Protein, Urine: 24.6 mg/dL

## 2014-05-29 LAB — LACTATE DEHYDROGENASE: LDH: 198 U/L (ref 94–250)

## 2014-05-29 LAB — CBC
HCT: 32.3 % — ABNORMAL LOW (ref 36.0–46.0)
Hemoglobin: 10.4 g/dL — ABNORMAL LOW (ref 12.0–15.0)
MCH: 23.4 pg — ABNORMAL LOW (ref 26.0–34.0)
MCHC: 32.2 g/dL (ref 30.0–36.0)
MCV: 72.6 fL — ABNORMAL LOW (ref 78.0–100.0)
Platelets: 251 10*3/uL (ref 150–400)
RBC: 4.45 MIL/uL (ref 3.87–5.11)
RDW: 13.8 % (ref 11.5–15.5)
WBC: 9.4 10*3/uL (ref 4.0–10.5)

## 2014-05-29 LAB — URIC ACID: Uric Acid, Serum: 4.3 mg/dL (ref 2.4–7.0)

## 2014-05-29 NOTE — MAU Provider Note (Signed)
History     CSN: [id number]  Arrival date and time: 05/29/14 1646   First Provider Initiated Contact with Patient 05/29/14 1724      Chief Complaint  Patient presents with  . Vaginal Discharge   HPI  Kimberly [name] is a 23 y.o. [id number] at [redacted]w[redacted]d. She presents with her panties being wet since yesterday.  It appears to be clear flud, no odor noticed. No bleeding, no contractions but has low abd pressure. + fetal movement today. Has back pain, hip pain when lies flat.  OB History    Gravida Para Term Preterm AB TAB SAB Ectopic Multiple Living   5 1 0 1 3 1 1 1 0 1       Past Medical History  Diagnosis Date  . Ovarian cyst     left side  . Hypertension   . Obesity   . Pregnant   . Pregnancy induced hypertension     Past Surgical History  Procedure Laterality Date  . Tonsillectomy    . Wisdom tooth extraction    . Dilation and curettage of uterus    . Cesarean section  04/09/2011    Procedure: CESAREAN SECTION;  Surgeon: Loney Laurence;  Location: [facility];  Service: Gynecology;  Laterality: N/A;  . Knee arthroscopy      Family History  Problem Relation Age of Onset  . Diabetes Mother   . Diabetes Maternal Grandmother   . Diabetes Maternal Grandfather   . Diabetes Paternal Grandmother   . Stroke Paternal Grandmother     History  Substance Use Topics  . Smoking status: Never Smoker   . Smokeless tobacco: Not on file  . Alcohol Use: No    Allergies:  Allergies  Allergen Reactions  . Lemon Juice Itching and Other (See Comments)    Tongue swelling  . Orange Juice Itching and Other (See Comments)    Tongue swelling  . Citrus Itching and Other (See Comments)    Tongue swelling  . Penicillins Itching  . Sulfa Antibiotics Itching  . Amoxicillin Rash  . Latex Itching and Rash    Prescriptions prior to admission  Medication Sig Dispense Refill Last Dose  . ASPIRIN PO Take by mouth.   04/24/2014 at Unknown time  . Calcium Carbonate Antacid (TUMS PO) Take  2-4 tablets by mouth daily as needed (indigestion & heartburn.).    04/24/2014 at Unknown time  . nitrofurantoin (MACRODANTIN) 100 MG capsule Take 100 mg by mouth 2 (two) times daily.   04/24/2014 at Unknown time  . Prenatal Vit w/Fe-Methylfol-FA (PNV PO) Take by mouth.   Past Week at Unknown time    Review of Systems  Constitutional: Negative for fever and chills.  Gastrointestinal: Negative for nausea, vomiting, diarrhea and constipation.  Genitourinary: Negative for dysuria, urgency and frequency.   Physical Exam   Last menstrual period 10/24/2013.  Physical Exam  Nursing note and vitals reviewed. Constitutional: She is oriented to person, place, and time. She appears well-developed and well-nourished. No distress.  GI: Soft. There is no tenderness.  Genitourinary:  Pelvic exam- Ext gen- nl anatomy, skin intact Vagina- scant creamy white discharge- no pooling Cx-appears closed   Musculoskeletal: Normal range of motion.  Neurological: She is alert and oriented to person, place, and time.  Skin: Skin is warm and dry.  Psychiatric: She has a normal mood and affect. Her behavior is normal.    MAU Course  Procedures  MDM Results for orders placed or performed during the  hospital encounter of 05/29/14 (from the past 24 hour(s))  Urinalysis, Routine w reflex microscopic     Status: Abnormal   Collection Time: 05/29/14  4:57 PM  Result Value Ref Range   Color, Urine YELLOW YELLOW   APPearance HAZY (A) CLEAR   Specific Gravity, Urine 1.025 1.005 - 1.030   pH 6.0 5.0 - 8.0   Glucose, UA NEGATIVE NEGATIVE mg/dL   Hgb urine dipstick NEGATIVE NEGATIVE   Bilirubin Urine NEGATIVE NEGATIVE   Ketones, ur 15 (A) NEGATIVE mg/dL   Protein, ur NEGATIVE NEGATIVE mg/dL   Urobilinogen, UA 0.2 0.0 - 1.0 mg/dL   Nitrite NEGATIVE NEGATIVE   Leukocytes, UA TRACE (A) NEGATIVE  Urine microscopic-add on     Status: Abnormal   Collection Time: 05/29/14  4:57 PM  Result Value Ref Range    Squamous Epithelial / LPF MANY (A) RARE   WBC, UA 7-10 <3 WBC/hpf   RBC / HPF 3-6 <3 RBC/hpf   Bacteria, UA MANY (A) RARE   Urine-Other MUCOUS PRESENT   Protein / creatinine ratio, urine     Status: None   Collection Time: 05/29/14  4:57 PM  Result Value Ref Range   Creatinine, Urine 248.97 mg/dL   Total Protein, Urine 24.6 mg/dL   Protein Creatinine Ratio 0.10 0.00 - 0.15  Comprehensive metabolic panel     Status: Abnormal   Collection Time: 05/29/14  6:00 PM  Result Value Ref Range   Sodium 136 (L) 137 - 147 mEq/L   Potassium 3.6 (L) 3.7 - 5.3 mEq/L   Chloride 101 96 - 112 mEq/L   CO2 21 19 - 32 mEq/L   Glucose, Bld 116 (H) 70 - 99 mg/dL   BUN 5 (L) 6 - 23 mg/dL   Creatinine, Ser 1.610.48 (L) 0.50 - 1.10 mg/dL   Calcium 9.1 8.4 - 09.610.5 mg/dL   Total Protein 6.3 6.0 - 8.3 g/dL   Albumin 2.6 (L) 3.5 - 5.2 g/dL   AST 16 0 - 37 U/L   ALT 11 0 - 35 U/L   Alkaline Phosphatase 122 (H) 39 - 117 U/L   Total Bilirubin 0.4 0.3 - 1.2 mg/dL   GFR calc non Af Amer >90 >90 mL/min   GFR calc Af Amer >90 >90 mL/min   Anion gap 14 5 - 15  CBC     Status: Abnormal   Collection Time: 05/29/14  6:00 PM  Result Value Ref Range   WBC 9.4 4.0 - 10.5 K/uL   RBC 4.45 3.87 - 5.11 MIL/uL   Hemoglobin 10.4 (L) 12.0 - 15.0 g/dL   HCT 04.532.3 (L) 40.936.0 - 81.146.0 %   MCV 72.6 (L) 78.0 - 100.0 fL   MCH 23.4 (L) 26.0 - 34.0 pg   MCHC 32.2 30.0 - 36.0 g/dL   RDW 91.413.8 78.211.5 - 95.615.5 %   Platelets 251 150 - 400 K/uL  Uric acid     Status: None   Collection Time: 05/29/14  6:00 PM  Result Value Ref Range   Uric Acid, Serum 4.3 2.4 - 7.0 mg/dL  Lactate dehydrogenase     Status: None   Collection Time: 05/29/14  6:00 PM  Result Value Ref Range   LDH 198 94 - 250 U/L     Assessment and Plan  Leukorrhea at 31 wks PIH labs negative  Urine for C&S Precautions reviewed Keep schedule appt for prenatal care in 1 1/2 wks Consulted with Dr Loreta AveK Ross  Nicola Quesnell M. 05/29/2014,  5:35 PM

## 2014-05-31 LAB — CULTURE, OB URINE
Colony Count: 75000
Special Requests: NORMAL

## 2014-06-10 ENCOUNTER — Encounter (HOSPITAL_COMMUNITY): Payer: Self-pay

## 2014-06-10 ENCOUNTER — Inpatient Hospital Stay (HOSPITAL_COMMUNITY)
Admission: AD | Admit: 2014-06-10 | Discharge: 2014-06-11 | Disposition: A | Discharge status: Home / Self Care | Payer: Medicaid Other | Source: Ambulatory Visit | Attending: Obstetrics | Admitting: Obstetrics

## 2014-06-10 DIAGNOSIS — O09293 Supervision of pregnancy with other poor reproductive or obstetric history, third trimester: Secondary | ICD-10-CM

## 2014-06-10 DIAGNOSIS — Z3A32 32 weeks gestation of pregnancy: Secondary | ICD-10-CM | POA: Insufficient documentation

## 2014-06-10 DIAGNOSIS — O26893 Other specified pregnancy related conditions, third trimester: Secondary | ICD-10-CM

## 2014-06-10 DIAGNOSIS — O1203 Gestational edema, third trimester: Secondary | ICD-10-CM | POA: Diagnosis present

## 2014-06-10 DIAGNOSIS — O133 Gestational [pregnancy-induced] hypertension without significant proteinuria, third trimester: Secondary | ICD-10-CM

## 2014-06-10 DIAGNOSIS — R519 Headache, unspecified: Secondary | ICD-10-CM

## 2014-06-10 DIAGNOSIS — R51 Headache: Secondary | ICD-10-CM | POA: Insufficient documentation

## 2014-06-10 NOTE — MAU Note (Signed)
Swollen hands and feet this wk. Severe heartburn today. Has had elevated B/P for 3wks.

## 2014-06-11 DIAGNOSIS — O26893 Other specified pregnancy related conditions, third trimester: Secondary | ICD-10-CM

## 2014-06-11 DIAGNOSIS — R51 Headache: Secondary | ICD-10-CM

## 2014-06-11 DIAGNOSIS — O133 Gestational [pregnancy-induced] hypertension without significant proteinuria, third trimester: Secondary | ICD-10-CM

## 2014-06-11 LAB — CBC
HCT: 31.2 % — ABNORMAL LOW (ref 36.0–46.0)
Hemoglobin: 9.9 g/dL — ABNORMAL LOW (ref 12.0–15.0)
MCH: 23.4 pg — ABNORMAL LOW (ref 26.0–34.0)
MCHC: 31.7 g/dL (ref 30.0–36.0)
MCV: 73.8 fL — ABNORMAL LOW (ref 78.0–100.0)
Platelets: 230 10*3/uL (ref 150–400)
RBC: 4.23 MIL/uL (ref 3.87–5.11)
RDW: 14.1 % (ref 11.5–15.5)
WBC: 9.6 10*3/uL (ref 4.0–10.5)

## 2014-06-11 LAB — URINALYSIS, ROUTINE W REFLEX MICROSCOPIC
Bilirubin Urine: NEGATIVE
Glucose, UA: NEGATIVE mg/dL
Hgb urine dipstick: NEGATIVE
Ketones, ur: NEGATIVE mg/dL
LEUKOCYTES UA: NEGATIVE
Nitrite: NEGATIVE
PH: 6.5 (ref 5.0–8.0)
PROTEIN: NEGATIVE mg/dL
Specific Gravity, Urine: 1.02 (ref 1.005–1.030)
Urobilinogen, UA: 0.2 mg/dL (ref 0.0–1.0)

## 2014-06-11 LAB — COMPREHENSIVE METABOLIC PANEL
ALBUMIN: 2.7 g/dL — AB (ref 3.5–5.2)
ALK PHOS: 120 U/L — AB (ref 39–117)
ALT: 14 U/L (ref 0–35)
AST: 26 U/L (ref 0–37)
Anion gap: 7 (ref 5–15)
BILIRUBIN TOTAL: 0.4 mg/dL (ref 0.3–1.2)
BUN: 6 mg/dL (ref 6–23)
CO2: 23 mmol/L (ref 19–32)
Calcium: 8.8 mg/dL (ref 8.4–10.5)
Chloride: 106 mEq/L (ref 96–112)
Creatinine, Ser: 0.46 mg/dL — ABNORMAL LOW (ref 0.50–1.10)
GFR calc Af Amer: >90 mL/min (ref >90)
GFR calc non Af Amer: >90 mL/min (ref >90)
Glucose, Bld: 96 mg/dL (ref 70–99)
POTASSIUM: 4 mmol/L (ref 3.5–5.1)
Sodium: 136 mmol/L (ref 135–145)
Total Protein: 6.1 g/dL (ref 6.0–8.3)

## 2014-06-11 LAB — PROTEIN / CREATININE RATIO, URINE
Creatinine, Urine: 122 mg/dL
Protein Creatinine Ratio: 0.11 (ref 0.00–0.15)
Total Protein, Urine: 13 mg/dL

## 2014-06-11 MED ORDER — OXYCODONE-ACETAMINOPHEN 5-325 MG PO TABS: 2.0000 | ORAL_TABLET | Freq: Once | ORAL | Status: DC

## 2014-06-11 MED ORDER — BUTALBITAL-APAP-CAFFEINE 50-325-40 MG PO TABS: 1.0000 | ORAL_TABLET | Freq: Four times a day (QID) | ORAL | Status: DC | PRN

## 2014-06-11 MED ORDER — MEDICAL COMPRESSION STOCKINGS MISC: 1.0000 | Freq: Every day | Status: DC

## 2014-06-11 MED ORDER — ACETAMINOPHEN 325 MG PO TABS
650.0000 mg | ORAL_TABLET | Freq: Once | ORAL | Status: AC
  Administered 2014-06-11: 650 mg via ORAL
  Filled 2014-06-11: qty 2

## 2014-06-11 MED ORDER — FAMOTIDINE 20 MG PO TABS: 20.0000 mg | ORAL_TABLET | Freq: Two times a day (BID) | ORAL | Status: DC

## 2014-06-11 NOTE — MAU Provider Note (Signed)
Chief Complaint:  Foot Swelling and Heartburn   First Provider Initiated Contact with Patient 06/11/14 0011     HPI: Kimberly [name] is a 23 y.o. [id number] at 2168w6d w/ Hx of PTD due to severe Pre-E and IUGR w/ last pregnancy who presents to maternity admissions reporting 1. Swelling in feet x several days 2. Generalized HA today 3. Blurry vision today 4. Heartburn that has not resolved w/ Tums.  Elevated BP past three weeks per pt. Hasn't tried anything for HA. Denies contractions, leakage of fluid or vaginal bleeding. Good fetal movement.   Past Medical History: Past Medical History  Diagnosis Date  . Ovarian cyst     left side  . Hypertension   . Obesity   . Pregnant   . Pregnancy induced hypertension     Past obstetric history: OB History  Gravida Para Term Preterm AB SAB TAB Ectopic Multiple Living  5 1 0 1 3 1 1 1 0 1     # Outcome Date GA Lbr Len/2nd Weight Sex Delivery Anes PTL Lv  5 Current           4 Preterm 04/09/11 679w5d  0.86 kg (1 lb 14.3 oz) F CS-LTranv Spinal  Y  3 SAB           2 TAB           1 Ectopic               Past Surgical History: Past Surgical History  Procedure Laterality Date  . Tonsillectomy    . Wisdom tooth extraction    . Dilation and curettage of uterus    . Cesarean section  04/09/2011    Procedure: CESAREAN SECTION;  Surgeon: Loney LaurenceMichelle A Horvath;  Location: [facility];  Service: Gynecology;  Laterality: N/A;  . Knee arthroscopy       Family History: Family History  Problem Relation Age of Onset  . Diabetes Mother   . Diabetes Maternal Grandmother   . Diabetes Maternal Grandfather   . Diabetes Paternal Grandmother   . Stroke Paternal Grandmother     Social History: History  Substance Use Topics  . Smoking status: Never Smoker   . Smokeless tobacco: Not on file  . Alcohol Use: No    Allergies:  Allergies  Allergen Reactions  . Lemon Juice Itching and Other (See Comments)    Tongue swelling  . Orange Juice Itching and  Other (See Comments)    Tongue swelling  . Citrus Itching and Other (See Comments)    Tongue swelling  . Penicillins Itching  . Sulfa Antibiotics Itching  . Amoxicillin Rash  . Latex Itching and Rash    Meds:  Prescriptions prior to admission  Medication Sig Dispense Refill Last Dose  . aspirin 81 MG tablet Take 81 mg by mouth daily.   05/27/2014 at Unknown time  . Prenatal Vit-Fe Fumarate-FA (PRENATAL MULTIVITAMIN) TABS tablet Take 1 tablet by mouth daily at 12 noon.   05/27/2014 at Unknown time    ROS: Pertinent findings in history of present illness.  Physical Exam  Blood pressure 137/59, pulse 83, temperature 98.6 F (37 C), temperature source Oral, resp. rate 20, height 5\' 8"  (1.727 m), weight 164.384 kg (362 lb 6.4 oz), last menstrual period 10/24/2013.    GENERAL: Well-developed, well-nourished female in no acute distress.  HEENT: normocephalic HEART: normal rate RESP: normal effort ABDOMEN: Soft, non-tender, gravid appropriate for gestational age EXTREMITIES: Nontender, 2+ pedal edema NEURO: alert and oriented  SPECULUM EXAM: Deferred  FHT:  Baseline 140 , moderate variability, accelerations present, no decelerations Contractions: none   Labs: Results for orders placed or performed during the hospital encounter of 06/10/14 (from the past 24 hour(s))  Urinalysis, Routine w reflex microscopic     Status: None   Collection Time: 06/10/14 11:30 PM  Result Value Ref Range   Color, Urine YELLOW YELLOW   APPearance CLEAR CLEAR   Specific Gravity, Urine 1.020 1.005 - 1.030   pH 6.5 5.0 - 8.0   Glucose, UA NEGATIVE NEGATIVE mg/dL   Hgb urine dipstick NEGATIVE NEGATIVE   Bilirubin Urine NEGATIVE NEGATIVE   Ketones, ur NEGATIVE NEGATIVE mg/dL   Protein, ur NEGATIVE NEGATIVE mg/dL   Urobilinogen, UA 0.2 0.0 - 1.0 mg/dL   Nitrite NEGATIVE NEGATIVE   Leukocytes, UA NEGATIVE NEGATIVE  Protein / creatinine ratio, urine     Status: None   Collection Time: 06/10/14 11:30 PM   Result Value Ref Range   Creatinine, Urine 122.00 mg/dL   Total Protein, Urine 13 mg/dL   Protein Creatinine Ratio 0.11 0.00 - 0.15  CBC     Status: Abnormal   Collection Time: 06/11/14 12:10 AM  Result Value Ref Range   WBC 9.6 4.0 - 10.5 K/uL   RBC 4.23 3.87 - 5.11 MIL/uL   Hemoglobin 9.9 (L) 12.0 - 15.0 g/dL   HCT 40.931.2 (L) 81.136.0 - 91.446.0 %   MCV 73.8 (L) 78.0 - 100.0 fL   MCH 23.4 (L) 26.0 - 34.0 pg   MCHC 31.7 30.0 - 36.0 g/dL   RDW 78.214.1 95.611.5 - 21.315.5 %   Platelets 230 150 - 400 K/uL  Comprehensive metabolic panel     Status: Abnormal   Collection Time: 06/11/14 12:10 AM  Result Value Ref Range   Sodium 136 135 - 145 mmol/L   Potassium 4.0 3.5 - 5.1 mmol/L   Chloride 106 96 - 112 mEq/L   CO2 23 19 - 32 mmol/L   Glucose, Bld 96 70 - 99 mg/dL   BUN 6 6 - 23 mg/dL   Creatinine, Ser 0.860.46 (L) 0.50 - 1.10 mg/dL   Calcium 8.8 8.4 - 57.810.5 mg/dL   Total Protein 6.1 6.0 - 8.3 g/dL   Albumin 2.7 (L) 3.5 - 5.2 g/dL   AST 26 0 - 37 U/L   ALT 14 0 - 35 U/L   Alkaline Phosphatase 120 (H) 39 - 117 U/L   Total Bilirubin 0.4 0.3 - 1.2 mg/dL   GFR calc non Af Amer >90 >90 mL/min   GFR calc Af Amer >90 >90 mL/min   Anion gap 7 5 - 15    Imaging:  [place] Ob Follow Up  05/16/2014   OBSTETRICAL ULTRASOUND: This exam was performed within a [facility]. The OB US report was generated in the AS system, and faxed to the ordering physician.   This report is available in the [employer] PACS. See the AS Obstetric US report via the Image Link.  MAU Course:   Assessment: 1. Headache in pregnancy, antepartum, third trimester   2. Transient hypertension of pregnancy in third trimester   3. Hx of pre-eclampsia in prior pregnancy, currently pregnant, third trimester     Plan: Discharge home in stable condition per consult w/ Dr. Chestine Sporelark. Pre-E precautions. Preterm labor precautions and fetal kick counts Work note written for today. Recommending the patient elevate legs and wear  compression stockings as much as possible.     Follow-up Information  Follow up with Marlow Baars, MD.   Specialty:  Obstetrics   Why:  As scheduled or As needed if symptoms worsen   Contact information:   [address] [place] [address] [phone]       Follow up with [facility] MATERNITY ADMISSIONS.   Why:  As needed in emergencies   Contact information:   [address] [id number] mc [place] [address] [phone]       Medication List    TAKE these medications        aspirin 81 MG tablet  Take 81 mg by mouth daily.     butalbital-acetaminophen-caffeine 50-325-40 MG per tablet  Commonly known as:  FIORICET  Take 1-2 tablets by mouth every 6 (six) hours as needed for headache.     famotidine 20 MG tablet  Commonly known as:  PEPCID  Take 1 tablet (20 mg total) by mouth 2 (two) times daily.     Medical Compression Stockings Misc  1 Device by Does not apply route daily.     prenatal multivitamin Tabs tablet  Take 1 tablet by mouth daily at 12 noon.       [place], CNM 06/11/2014 2:41 AM

## 2014-06-11 NOTE — Discharge Instructions (Signed)

## 2014-06-14 ENCOUNTER — Encounter (HOSPITAL_BASED_OUTPATIENT_CLINIC_OR_DEPARTMENT_OTHER): Payer: Self-pay | Admitting: *Deleted

## 2014-06-14 ENCOUNTER — Emergency Department (HOSPITAL_BASED_OUTPATIENT_CLINIC_OR_DEPARTMENT_OTHER)
Admission: EM | Admit: 2014-06-14 | Discharge: 2014-06-14 | Discharge status: Against Medical Advice | Payer: Medicaid Other | Attending: Emergency Medicine | Admitting: Emergency Medicine

## 2014-06-14 DIAGNOSIS — O9989 Other specified diseases and conditions complicating pregnancy, childbirth and the puerperium: Secondary | ICD-10-CM | POA: Diagnosis not present

## 2014-06-14 DIAGNOSIS — M79641 Pain in right hand: Secondary | ICD-10-CM | POA: Diagnosis not present

## 2014-06-14 DIAGNOSIS — O10013 Pre-existing essential hypertension complicating pregnancy, third trimester: Secondary | ICD-10-CM | POA: Diagnosis not present

## 2014-06-14 DIAGNOSIS — Z3A33 33 weeks gestation of pregnancy: Secondary | ICD-10-CM | POA: Diagnosis not present

## 2014-06-14 DIAGNOSIS — R200 Anesthesia of skin: Secondary | ICD-10-CM | POA: Diagnosis not present

## 2014-06-14 DIAGNOSIS — M79642 Pain in left hand: Secondary | ICD-10-CM | POA: Diagnosis not present

## 2014-06-14 NOTE — ED Notes (Addendum)
33 weeks preg  Pt c/o bil hand numbness/pain  x 1 week, [facility]

## 2014-06-14 NOTE — ED Notes (Signed)
Pt states she called OB yesterday and was advised to go to [facility]-pt did not

## 2014-06-20 ENCOUNTER — Ambulatory Visit (HOSPITAL_COMMUNITY)
Admission: RE | Admit: 2014-06-20 | Discharge: 2014-06-20 | Disposition: A | Discharge status: Home / Self Care | Payer: Medicaid Other | Source: Ambulatory Visit | Attending: Obstetrics | Admitting: Obstetrics

## 2014-06-20 DIAGNOSIS — O99213 Obesity complicating pregnancy, third trimester: Secondary | ICD-10-CM | POA: Diagnosis not present

## 2014-06-20 DIAGNOSIS — O09213 Supervision of pregnancy with history of pre-term labor, third trimester: Secondary | ICD-10-CM | POA: Insufficient documentation

## 2014-06-20 DIAGNOSIS — O09293 Supervision of pregnancy with other poor reproductive or obstetric history, third trimester: Secondary | ICD-10-CM | POA: Insufficient documentation

## 2014-06-20 DIAGNOSIS — O3421 Maternal care for scar from previous cesarean delivery: Secondary | ICD-10-CM | POA: Diagnosis not present

## 2014-06-20 DIAGNOSIS — Z3A34 34 weeks gestation of pregnancy: Secondary | ICD-10-CM | POA: Insufficient documentation

## 2014-06-20 DIAGNOSIS — O09292 Supervision of pregnancy with other poor reproductive or obstetric history, second trimester: Secondary | ICD-10-CM

## 2014-06-30 ENCOUNTER — Other Ambulatory Visit: Payer: Self-pay | Admitting: Obstetrics and Gynecology

## 2014-06-30 LAB — OB RESULTS CONSOLE GBS: STREP GROUP B AG: POSITIVE

## 2014-07-10 ENCOUNTER — Encounter (HOSPITAL_COMMUNITY): Payer: Self-pay | Admitting: *Deleted

## 2014-07-10 ENCOUNTER — Inpatient Hospital Stay (HOSPITAL_COMMUNITY)
Admission: AD | Admit: 2014-07-10 | Discharge: 2014-07-11 | Disposition: A | Discharge status: Home / Self Care | Payer: Medicaid Other | Source: Ambulatory Visit | Attending: Obstetrics and Gynecology | Admitting: Obstetrics and Gynecology

## 2014-07-10 DIAGNOSIS — Z3A37 37 weeks gestation of pregnancy: Secondary | ICD-10-CM | POA: Insufficient documentation

## 2014-07-10 DIAGNOSIS — O471 False labor at or after 37 completed weeks of gestation: Secondary | ICD-10-CM | POA: Insufficient documentation

## 2014-07-10 NOTE — MAU Note (Signed)
Contractions, pinkish discharge

## 2014-07-11 DIAGNOSIS — O471 False labor at or after 37 completed weeks of gestation: Secondary | ICD-10-CM | POA: Diagnosis present

## 2014-07-11 DIAGNOSIS — Z3A37 37 weeks gestation of pregnancy: Secondary | ICD-10-CM | POA: Diagnosis not present

## 2014-07-11 NOTE — Discharge Instructions (Signed)
Braxton Hicks Contractions °Contractions of the uterus can occur throughout pregnancy. Contractions are not always a sign that you are in labor.  °WHAT ARE BRAXTON HICKS CONTRACTIONS?  °Contractions that occur before labor are called Braxton Hicks contractions, or false labor. Toward the end of pregnancy (32-34 weeks), these contractions can develop more often and may become more forceful. This is not true labor because these contractions do not result in opening (dilatation) and thinning of the cervix. They are sometimes difficult to tell apart from true labor because these contractions can be forceful and people have different pain tolerances. You should not feel embarrassed if you go to the hospital with false labor. Sometimes, the only way to tell if you are in true labor is for your health care provider to look for changes in the cervix. °If there are no prenatal problems or other health problems associated with the pregnancy, it is completely safe to be sent home with false labor and await the onset of true labor. °HOW CAN YOU TELL THE DIFFERENCE BETWEEN TRUE AND FALSE LABOR? °False Labor °· The contractions of false labor are usually shorter and not as hard as those of true labor.   °· The contractions are usually irregular.   °· The contractions are often felt in the front of the lower abdomen and in the groin.   °· The contractions may go away when you walk around or change positions while lying down.   °· The contractions get weaker and are shorter lasting as time goes on.   °· The contractions do not usually become progressively stronger, regular, and closer together as with true labor.   °True Labor °· Contractions in true labor last 30-70 seconds, become very regular, usually become more intense, and increase in frequency.   °· The contractions do not go away with walking.   °· The discomfort is usually felt in the top of the uterus and spreads to the lower abdomen and low back.   °· True labor can be  determined by your health care provider with an exam. This will show that the cervix is dilating and getting thinner.   °WHAT TO REMEMBER °· Keep up with your usual exercises and follow other instructions given by your health care provider.   °· Take medicines as directed by your health care provider.   °· Keep your regular prenatal appointments.   °· Eat and drink lightly if you think you are going into labor.   °· If Braxton Hicks contractions are making you uncomfortable:   °¨ Change your position from lying down or resting to walking, or from walking to resting.   °¨ Sit and rest in a tub of warm water.   °¨ Drink 2-3 glasses of water. Dehydration may cause these contractions.   °¨ Do slow and deep breathing several times an hour.   °WHEN SHOULD I SEEK IMMEDIATE MEDICAL CARE? °Seek immediate medical care if: °· Your contractions become stronger, more regular, and closer together.   °· You have fluid leaking or gushing from your vagina.   °· You have a fever.   °· You pass blood-tinged mucus.   °· You have vaginal bleeding.   °· You have continuous abdominal pain.   °· You have low back pain that you never had before.   °· You feel your baby's head pushing down and causing pelvic pressure.   °· Your baby is not moving as much as it used to.   °Document Released: 06/03/2005 Document Revised: 06/08/2013 Document Reviewed: 03/15/2013 °ExitCare® Patient Information ©2015 [employer], [employer]. This information is not intended to replace advice given to you by your health care   provider. Make sure you discuss any questions you have with your health care provider. ° °

## 2014-07-19 ENCOUNTER — Encounter (HOSPITAL_COMMUNITY): Payer: Self-pay

## 2014-07-19 ENCOUNTER — Other Ambulatory Visit: Payer: Self-pay | Admitting: Obstetrics and Gynecology

## 2014-07-19 ENCOUNTER — Inpatient Hospital Stay (HOSPITAL_COMMUNITY)
Admission: AD | Admit: 2014-07-19 | Discharge: 2014-07-24 | DRG: 765 | Disposition: A | Discharge status: Home / Self Care | Payer: Medicaid Other | Source: Ambulatory Visit | Attending: Obstetrics and Gynecology | Admitting: Obstetrics and Gynecology

## 2014-07-19 DIAGNOSIS — Z302 Encounter for sterilization: Secondary | ICD-10-CM | POA: Diagnosis not present

## 2014-07-19 DIAGNOSIS — O133 Gestational [pregnancy-induced] hypertension without significant proteinuria, third trimester: Secondary | ICD-10-CM | POA: Diagnosis present

## 2014-07-19 DIAGNOSIS — Z3A38 38 weeks gestation of pregnancy: Secondary | ICD-10-CM | POA: Diagnosis present

## 2014-07-19 DIAGNOSIS — N736 Female pelvic peritoneal adhesions (postinfective): Secondary | ICD-10-CM | POA: Diagnosis present

## 2014-07-19 DIAGNOSIS — K219 Gastro-esophageal reflux disease without esophagitis: Secondary | ICD-10-CM | POA: Diagnosis present

## 2014-07-19 DIAGNOSIS — O9962 Diseases of the digestive system complicating childbirth: Secondary | ICD-10-CM | POA: Diagnosis present

## 2014-07-19 DIAGNOSIS — O99214 Obesity complicating childbirth: Secondary | ICD-10-CM | POA: Diagnosis present

## 2014-07-19 DIAGNOSIS — O99210 Obesity complicating pregnancy, unspecified trimester: Secondary | ICD-10-CM

## 2014-07-19 DIAGNOSIS — O3421 Maternal care for scar from previous cesarean delivery: Principal | ICD-10-CM | POA: Diagnosis present

## 2014-07-19 DIAGNOSIS — Z6841 Body Mass Index (BMI) 40.0 and over, adult: Secondary | ICD-10-CM | POA: Diagnosis not present

## 2014-07-19 DIAGNOSIS — O139 Gestational [pregnancy-induced] hypertension without significant proteinuria, unspecified trimester: Secondary | ICD-10-CM | POA: Diagnosis present

## 2014-07-19 LAB — COMPREHENSIVE METABOLIC PANEL
ALBUMIN: 2.8 g/dL — AB (ref 3.5–5.2)
ALT: 15 U/L (ref 0–35)
AST: 23 U/L (ref 0–37)
Alkaline Phosphatase: 163 U/L — ABNORMAL HIGH (ref 39–117)
Anion gap: 6 (ref 5–15)
BILIRUBIN TOTAL: 0.7 mg/dL (ref 0.3–1.2)
BUN: 7 mg/dL (ref 6–23)
CO2: 23 mmol/L (ref 19–32)
CREATININE: 0.49 mg/dL — AB (ref 0.50–1.10)
Calcium: 8.7 mg/dL (ref 8.4–10.5)
Chloride: 105 mmol/L (ref 96–112)
GFR calc non Af Amer: >90 mL/min (ref >90)
GLUCOSE: 87 mg/dL (ref 70–99)
POTASSIUM: 3.5 mmol/L (ref 3.5–5.1)
SODIUM: 134 mmol/L — AB (ref 135–145)
Total Protein: 6.4 g/dL (ref 6.0–8.3)

## 2014-07-19 LAB — CBC
HEMATOCRIT: 32.7 % — AB (ref 36.0–46.0)
HEMOGLOBIN: 10.4 g/dL — AB (ref 12.0–15.0)
MCH: 23.4 pg — ABNORMAL LOW (ref 26.0–34.0)
MCHC: 31.8 g/dL (ref 30.0–36.0)
MCV: 73.6 fL — AB (ref 78.0–100.0)
PLATELETS: 257 10*3/uL (ref 150–400)
RBC: 4.44 MIL/uL (ref 3.87–5.11)
RDW: 14.4 % (ref 11.5–15.5)
WBC: 8.7 10*3/uL (ref 4.0–10.5)

## 2014-07-19 LAB — PROTEIN / CREATININE RATIO, URINE
Creatinine, Urine: 130 mg/dL
PROTEIN CREATININE RATIO: 0.1 (ref 0.00–0.15)
TOTAL PROTEIN, URINE: 13 mg/dL

## 2014-07-19 LAB — TYPE AND SCREEN
ABO/RH(D): A POS
ANTIBODY SCREEN: NEGATIVE

## 2014-07-19 LAB — URIC ACID: URIC ACID, SERUM: 4.9 mg/dL (ref 2.4–7.0)

## 2014-07-19 MED ORDER — CITRIC ACID-SODIUM CITRATE 334-500 MG/5ML PO SOLN: 30.0000 mL | ORAL | Status: DC | PRN

## 2014-07-19 MED ORDER — OXYCODONE-ACETAMINOPHEN 5-325 MG PO TABS: 2.0000 | ORAL_TABLET | ORAL | Status: DC | PRN

## 2014-07-19 MED ORDER — OXYCODONE-ACETAMINOPHEN 5-325 MG PO TABS: 1.0000 | ORAL_TABLET | ORAL | Status: DC | PRN

## 2014-07-19 MED ORDER — ZOLPIDEM TARTRATE 5 MG PO TABS: 5.0000 mg | ORAL_TABLET | Freq: Every evening | ORAL | Status: DC | PRN

## 2014-07-19 MED ORDER — OXYTOCIN 40 UNITS IN LACTATED RINGERS INFUSION - SIMPLE MED
1.0000 m[IU]/min | INTRAVENOUS | Status: DC
  Administered 2014-07-19: 1 m[IU]/min via INTRAVENOUS
  Filled 2014-07-19: qty 1000

## 2014-07-19 MED ORDER — ACETAMINOPHEN 325 MG PO TABS: 650.0000 mg | ORAL_TABLET | ORAL | Status: DC | PRN

## 2014-07-19 MED ORDER — OXYTOCIN BOLUS FROM INFUSION: 500.0000 mL | INTRAVENOUS | Status: DC

## 2014-07-19 MED ORDER — ONDANSETRON HCL 4 MG/2ML IJ SOLN: 4.0000 mg | Freq: Four times a day (QID) | INTRAMUSCULAR | Status: DC | PRN

## 2014-07-19 MED ORDER — FLEET ENEMA 7-19 GM/118ML RE ENEM: 1.0000 | ENEMA | RECTAL | Status: DC | PRN

## 2014-07-19 MED ORDER — CEFAZOLIN SODIUM-DEXTROSE 2-3 GM-% IV SOLR
2.0000 g | Freq: Four times a day (QID) | INTRAVENOUS | Status: DC
  Administered 2014-07-19 – 2014-07-21 (×6): 2 g via INTRAVENOUS
  Filled 2014-07-19 (×10): qty 50

## 2014-07-19 MED ORDER — LIDOCAINE HCL (PF) 1 % IJ SOLN: 30.0000 mL | INTRAMUSCULAR | Status: DC | PRN

## 2014-07-19 MED ORDER — OXYTOCIN 40 UNITS IN LACTATED RINGERS INFUSION - SIMPLE MED: 62.5000 mL/h | INTRAVENOUS | Status: DC

## 2014-07-19 MED ORDER — TERBUTALINE SULFATE 1 MG/ML IJ SOLN: 0.2500 mg | Freq: Once | INTRAMUSCULAR | Status: AC | PRN

## 2014-07-19 MED ORDER — CLINDAMYCIN PHOSPHATE 900 MG/50ML IV SOLN
900.0000 mg | Freq: Three times a day (TID) | INTRAVENOUS | Status: DC
  Administered 2014-07-19: 900 mg via INTRAVENOUS
  Filled 2014-07-19 (×2): qty 50

## 2014-07-19 MED ORDER — LACTATED RINGERS IV SOLN: 500.0000 mL | INTRAVENOUS | Status: DC | PRN

## 2014-07-19 MED ORDER — LACTATED RINGERS IV SOLN
INTRAVENOUS | Status: DC
  Administered 2014-07-19 – 2014-07-20 (×2): via INTRAVENOUS

## 2014-07-19 NOTE — Progress Notes (Signed)
Filed Vitals:   07/19/14 1600 07/19/14 1706 07/19/14 1806 07/19/14 1913  BP:  137/73 127/80 116/65  Pulse:  87 103 124  Temp:      TempSrc:      Resp:  18 20 18   Height: 5\' 8"  (1.727 m)     Weight: 165.563 kg (365 lb)       Results for orders placed or performed during the hospital encounter of 07/19/14 (from the past 24 hour(s))  CBC     Status: Abnormal   Collection Time: 07/19/14  4:25 PM  Result Value Ref Range   WBC 8.7 4.0 - 10.5 K/uL   RBC 4.44 3.87 - 5.11 MIL/uL   Hemoglobin 10.4 (L) 12.0 - 15.0 g/dL   HCT 16.132.7 (L) 09.636.0 - 04.546.0 %   MCV 73.6 (L) 78.0 - 100.0 fL   MCH 23.4 (L) 26.0 - 34.0 pg   MCHC 31.8 30.0 - 36.0 g/dL   RDW 40.914.4 81.111.5 - 91.415.5 %   Platelets 257 150 - 400 K/uL  Comprehensive metabolic panel     Status: Abnormal   Collection Time: 07/19/14  4:25 PM  Result Value Ref Range   Sodium 134 (L) 135 - 145 mmol/L   Potassium 3.5 3.5 - 5.1 mmol/L   Chloride 105 96 - 112 mmol/L   CO2 23 19 - 32 mmol/L   Glucose, Bld 87 70 - 99 mg/dL   BUN 7 6 - 23 mg/dL   Creatinine, Ser 7.820.49 (L) 0.50 - 1.10 mg/dL   Calcium 8.7 8.4 - 95.610.5 mg/dL   Total Protein 6.4 6.0 - 8.3 g/dL   Albumin 2.8 (L) 3.5 - 5.2 g/dL   AST 23 0 - 37 U/L   ALT 15 0 - 35 U/L   Alkaline Phosphatase 163 (H) 39 - 117 U/L   Total Bilirubin 0.7 0.3 - 1.2 mg/dL   GFR calc non Af Amer >90 >90 mL/min   GFR calc Af Amer >90 >90 mL/min   Anion gap 6 5 - 15  Uric acid     Status: None   Collection Time: 07/19/14  4:25 PM  Result Value Ref Range   Uric Acid, Serum 4.9 2.4 - 7.0 mg/dL  Type and screen     Status: None   Collection Time: 07/19/14  4:25 PM  Result Value Ref Range   ABO/RH(D) A POS    Antibody Screen NEG    Sample Expiration 07/22/2014   Protein / creatinine ratio, urine     Status: None   Collection Time: 07/19/14  4:33 PM  Result Value Ref Range   Creatinine, Urine 130.00 mg/dL   Total Protein, Urine 13 mg/dL   Protein Creatinine Ratio 0.10 0.00 - 0.15    FHTsd 130s, g STV, NST R, cat  1.   Toco  Occ. SVE 1/60/-2, VTX, unceratin if was able to AROM.    VBAC consent signed.  Pt desires pp BTL - call office in am for papers.  Pt given clinda initially for GBS but sensitivities were not recorded.  Pt had rash and hives with penicillin.  D/w pt possible cross reactivity of allergy with Ancef- pt understands and accepts Ancef to make sure she is covered for GBS.

## 2014-07-19 NOTE — H&P (Addendum)
24 y.o. 5540w2d  [name] comes in c/o PIH at term with severe features of preeclampsia including blurry vision and persistent HA.  Otherwise has good fetal movement and no bleeding.  No other signs of preeclampsia-   Past Medical History  Diagnosis Date  . Ovarian cyst     left side  . Hypertension   . Obesity   . Pregnant   . Pregnancy induced hypertension     Past Surgical History  Procedure Laterality Date  . Tonsillectomy    . Wisdom tooth extraction    . Dilation and curettage of uterus    . Cesarean section  04/09/2011    Procedure: CESAREAN SECTION;  Surgeon: Loney LaurenceMichelle [name] Orlandus Borowski;  Location: [facility];  Service: Gynecology;  Laterality: N/[name];  . Knee arthroscopy      OB History  Gravida Para Term Preterm AB SAB TAB Ectopic Multiple Living  5 1 0 1 3 1 1 1 0 1     # Outcome Date GA Lbr Len/2nd Weight Sex Delivery Anes PTL Lv  5 Current           4 Preterm 04/09/11 1415w5d  0.86 kg (1 lb 14.3 oz) F CS-LTranv Spinal  Y  3 SAB           2 TAB           1 Ectopic               History   Social History  . Marital Status: Single    Spouse Name: N/[name]    Number of Children: N/[name]  . Years of Education: N/[name]   Occupational History  . Not on file.   Social History Main Topics  . Smoking status: Never Smoker   . Smokeless tobacco: Not on file  . Alcohol Use: No  . Drug Use: No  . Sexual Activity: Not Currently    Birth Control/ Protection: None     Comment: last sex one week ago   Other Topics Concern  . Not on file   Social History Narrative   Lemon juice; Orange juice; Citrus; Penicillins; Sulfa antibiotics; Amoxicillin; and Latex    Prenatal Transfer Tool  Maternal Diabetes: No Genetic Screening: Normal Maternal Ultrasounds/Referrals: Normal Fetal Ultrasounds or other Referrals:  Referred to Materal Fetal Medicine  Maternal Substance Abuse:  No Significant Maternal Medications:  None Significant Maternal Lab Results: None  Other PNC: uncomplicated to this point.   Previous pregnancy complicated by severe preeclampsia and preterm IUGR.  Pt had C/S.    There were no vitals filed for this visit.   Lungs/Cor:  NAD Abdomen:  soft, gravid Ex:  no cords, erythema SVE:  1/60/-2, VTX FHTs:  Present - NST pending Toco:  pending   [name]/P   Term with PIH and severe features.  Desires VBAC.  Low dose pitocin induction.  GBS POS- PCN  Kimberly [name]

## 2014-07-20 ENCOUNTER — Inpatient Hospital Stay (HOSPITAL_COMMUNITY): Payer: Medicaid Other | Admitting: Anesthesiology

## 2014-07-20 ENCOUNTER — Encounter (HOSPITAL_COMMUNITY): Payer: Self-pay | Admitting: *Deleted

## 2014-07-20 LAB — CBC
HCT: 33.9 % — ABNORMAL LOW (ref 36.0–46.0)
Hemoglobin: 10.7 g/dL — ABNORMAL LOW (ref 12.0–15.0)
MCH: 23.2 pg — AB (ref 26.0–34.0)
MCHC: 31.6 g/dL (ref 30.0–36.0)
MCV: 73.5 fL — ABNORMAL LOW (ref 78.0–100.0)
Platelets: 254 10*3/uL (ref 150–400)
RBC: 4.61 MIL/uL (ref 3.87–5.11)
RDW: 14.5 % (ref 11.5–15.5)
WBC: 8.5 10*3/uL (ref 4.0–10.5)

## 2014-07-20 LAB — RPR: RPR Ser Ql: NONREACTIVE

## 2014-07-20 MED ORDER — FENTANYL 2.5 MCG/ML BUPIVACAINE 1/10 % EPIDURAL INFUSION (WH - ANES)
14.0000 mL/h | INTRAMUSCULAR | Status: DC | PRN
  Administered 2014-07-20 – 2014-07-21 (×4): 14 mL/h via EPIDURAL
  Filled 2014-07-20 (×4): qty 125

## 2014-07-20 MED ORDER — FENTANYL 2.5 MCG/ML BUPIVACAINE 1/10 % EPIDURAL INFUSION (WH - ANES)
INTRAMUSCULAR | Status: DC | PRN
  Administered 2014-07-20: 14 mL/h via EPIDURAL

## 2014-07-20 MED ORDER — EPHEDRINE 5 MG/ML INJ: 10.0000 mg | INTRAVENOUS | Status: DC | PRN

## 2014-07-20 MED ORDER — BUPIVACAINE HCL (PF) 0.25 % IJ SOLN
INTRAMUSCULAR | Status: DC | PRN
  Administered 2014-07-20 – 2014-07-21 (×4): 5 mL

## 2014-07-20 MED ORDER — DIPHENHYDRAMINE HCL 50 MG/ML IJ SOLN: 12.5000 mg | INTRAMUSCULAR | Status: DC | PRN

## 2014-07-20 MED ORDER — LIDOCAINE HCL (PF) 1 % IJ SOLN
INTRAMUSCULAR | Status: DC | PRN
  Administered 2014-07-20 (×2): 4 mL

## 2014-07-20 MED ORDER — PHENYLEPHRINE 40 MCG/ML (10ML) SYRINGE FOR IV PUSH (FOR BLOOD PRESSURE SUPPORT): 80.0000 ug | PREFILLED_SYRINGE | INTRAVENOUS | Status: DC | PRN

## 2014-07-20 MED ORDER — LABETALOL HCL 5 MG/ML IV SOLN
20.0000 mg | Freq: Once | INTRAVENOUS | Status: AC
  Administered 2014-07-21: 20 mg via INTRAVENOUS
  Filled 2014-07-20: qty 4

## 2014-07-20 MED ORDER — PHENYLEPHRINE 40 MCG/ML (10ML) SYRINGE FOR IV PUSH (FOR BLOOD PRESSURE SUPPORT)
80.0000 ug | PREFILLED_SYRINGE | INTRAVENOUS | Status: DC | PRN
  Filled 2014-07-20 (×2): qty 20

## 2014-07-20 MED ORDER — LACTATED RINGERS IV SOLN: 500.0000 mL | Freq: Once | INTRAVENOUS | Status: DC

## 2014-07-20 MED ORDER — OXYTOCIN 40 UNITS IN LACTATED RINGERS INFUSION - SIMPLE MED: 1.0000 m[IU]/min | INTRAVENOUS | Status: DC

## 2014-07-20 NOTE — Progress Notes (Signed)
Pt now with an epidural. She had an amniotomy with clear fluid. Cx 10/1/-3. IUPC placed. FHTs reactive. Now on 24 miu of pitocin.

## 2014-07-20 NOTE — Anesthesia Procedure Notes (Signed)
Epidural Patient location during procedure: OB Start time: 07/20/2014 12:07 PM  Preanesthetic Checklist Completed: patient identified, site marked, surgical consent, pre-op evaluation, timeout performed, IV checked, risks and benefits discussed and monitors and equipment checked  Epidural Patient position: sitting Prep: site prepped and draped and DuraPrep Patient monitoring: continuous pulse ox and blood pressure Approach: midline Location: L3-L4 Injection technique: LOR air  Needle:  Needle type: Tuohy  Needle gauge: 17 G Needle length: 9 cm and 9 Needle insertion depth: 5 cm and 10 cm Catheter type: closed end flexible Catheter size: 19 Gauge Catheter at skin depth: 15 cm Test dose: negative and Other  Assessment Events: blood not aspirated, injection not painful, no injection resistance, negative IV test and no paresthesia  Additional Notes Patient identified. Risks and benefits discussed including failed block, incomplete  Pain control, post dural puncture headache, nerve damage, paralysis, blood pressure Changes, nausea, vomiting, reactions to medications-both toxic and allergic and post Partum back pain. All questions were answered. Patient expressed understanding and wished to proceed. Sterile technique was used throughout procedure. Epidural site was Dressed with sterile barrier dressing. No paresthesias, signs of intravascular injection Or signs of intrathecal spread were encountered.  Patient was more comfortable after the epidural was dosed. Please see RN's note for documentation of vital signs and FHR which are stable.

## 2014-07-20 NOTE — Progress Notes (Signed)
Pt without c/o. She feels no ctxs. She is on 6MIU of pit. Attempts at AROM last pm and this AM were unsuccessful due to technical constraints and pt non cooperation. Cx - 10 /tight 1/ -4 vtx.  She has a strong desire for VBAC. Cannot use cytotec. PLAN/ Will increase pit

## 2014-07-20 NOTE — Anesthesia Preprocedure Evaluation (Addendum)
Anesthesia Evaluation  Patient identified by MRN, date of birth, ID band Patient awake    Reviewed: Allergy & Precautions, NPO status , Patient's Chart, lab work & pertinent test results  Airway Mallampati: III  TM Distance: >3 FB Neck ROM: Full    Dental no notable dental hx. (+) Teeth Intact   Pulmonary neg pulmonary ROS,  breath sounds clear to auscultation  Pulmonary exam normal       Cardiovascular hypertension, Pt. on medications and Pt. on home beta blockers Rhythm:Regular Rate:Normal     Neuro/Psych negative neurological ROS  negative psych ROS   GI/Hepatic Neg liver ROS, GERD-  ,  Endo/Other  Morbid obesity  Renal/GU negative Renal ROS  negative genitourinary   Musculoskeletal   Abdominal (+) + obese,   Peds  Hematology  (+) anemia ,   Anesthesia Other Findings   Reproductive/Obstetrics (+) Pregnancy Previous C/Section Pre eclampsia                             Anesthesia Physical Anesthesia Plan  ASA: III and emergent  Anesthesia Plan: Epidural   Post-op Pain Management:    Induction:   Airway Management Planned: Natural Airway  Additional Equipment:   Intra-op Plan:   Post-operative Plan:   Informed Consent: I have reviewed the patients History and Physical, chart, labs and discussed the procedure including the risks, benefits and alternatives for the proposed anesthesia with the patient or authorized representative who has indicated his/her understanding and acceptance.     Plan Discussed with: Anesthesiologist, CRNA and Surgeon  Anesthesia Plan Comments: (Patient for C/Section for failure to progress. Will use epidural for C/Section. M. Cole Eastridge,MD)       Anesthesia Quick Evaluation

## 2014-07-21 ENCOUNTER — Encounter (HOSPITAL_COMMUNITY): Payer: Self-pay | Admitting: *Deleted

## 2014-07-21 ENCOUNTER — Encounter (HOSPITAL_COMMUNITY)
Admission: AD | Disposition: A | Discharge status: Home / Self Care | Payer: Self-pay | Source: Ambulatory Visit | Attending: Obstetrics and Gynecology

## 2014-07-21 DIAGNOSIS — O99214 Obesity complicating childbirth: Secondary | ICD-10-CM | POA: Diagnosis present

## 2014-07-21 DIAGNOSIS — O99210 Obesity complicating pregnancy, unspecified trimester: Secondary | ICD-10-CM

## 2014-07-21 SURGERY — Surgical Case
Anesthesia: Epidural

## 2014-07-21 MED ORDER — METOCLOPRAMIDE HCL 5 MG/ML IJ SOLN
INTRAMUSCULAR | Status: AC
  Filled 2014-07-21: qty 2

## 2014-07-21 MED ORDER — OXYCODONE-ACETAMINOPHEN 5-325 MG PO TABS
2.0000 | ORAL_TABLET | ORAL | Status: DC | PRN
  Administered 2014-07-23 – 2014-07-24 (×3): 2 via ORAL
  Filled 2014-07-21 (×3): qty 2

## 2014-07-21 MED ORDER — KETOROLAC TROMETHAMINE 30 MG/ML IJ SOLN
INTRAMUSCULAR | Status: AC
  Filled 2014-07-21: qty 1

## 2014-07-21 MED ORDER — ONDANSETRON HCL 4 MG PO TABS: 4.0000 mg | ORAL_TABLET | ORAL | Status: DC | PRN

## 2014-07-21 MED ORDER — OXYCODONE-ACETAMINOPHEN 5-325 MG PO TABS
1.0000 | ORAL_TABLET | ORAL | Status: DC | PRN
  Administered 2014-07-22: 1 via ORAL
  Filled 2014-07-21: qty 1

## 2014-07-21 MED ORDER — DIPHENHYDRAMINE HCL 50 MG/ML IJ SOLN: 12.5000 mg | INTRAMUSCULAR | Status: DC | PRN

## 2014-07-21 MED ORDER — KETOROLAC TROMETHAMINE 30 MG/ML IJ SOLN
30.0000 mg | Freq: Once | INTRAMUSCULAR | Status: AC
  Administered 2014-07-21: 30 mg via INTRAMUSCULAR

## 2014-07-21 MED ORDER — PRENATAL MULTIVITAMIN CH
1.0000 | ORAL_TABLET | Freq: Every day | ORAL | Status: DC
  Administered 2014-07-22 – 2014-07-24 (×3): 1 via ORAL
  Filled 2014-07-21 (×3): qty 1

## 2014-07-21 MED ORDER — BUPIVACAINE HCL (PF) 0.25 % IJ SOLN
10.0000 mL | Freq: Once | INTRAMUSCULAR | Status: DC
  Filled 2014-07-21: qty 10

## 2014-07-21 MED ORDER — FENTANYL CITRATE 0.05 MG/ML IJ SOLN: 25.0000 ug | INTRAMUSCULAR | Status: DC | PRN

## 2014-07-21 MED ORDER — PHENYLEPHRINE 40 MCG/ML (10ML) SYRINGE FOR IV PUSH (FOR BLOOD PRESSURE SUPPORT)
PREFILLED_SYRINGE | INTRAVENOUS | Status: AC
  Filled 2014-07-21: qty 10

## 2014-07-21 MED ORDER — BUPIVACAINE LIPOSOME 1.3 % IJ SUSP
INTRAMUSCULAR | Status: DC | PRN
  Administered 2014-07-21: 20 mL

## 2014-07-21 MED ORDER — SENNOSIDES-DOCUSATE SODIUM 8.6-50 MG PO TABS
2.0000 | ORAL_TABLET | ORAL | Status: DC
  Administered 2014-07-22 (×2): 2 via ORAL
  Filled 2014-07-21 (×2): qty 2

## 2014-07-21 MED ORDER — ZOLPIDEM TARTRATE 5 MG PO TABS: 5.0000 mg | ORAL_TABLET | Freq: Every evening | ORAL | Status: DC | PRN

## 2014-07-21 MED ORDER — ONDANSETRON HCL 4 MG/2ML IJ SOLN: 4.0000 mg | Freq: Three times a day (TID) | INTRAMUSCULAR | Status: DC | PRN

## 2014-07-21 MED ORDER — NALOXONE HCL 1 MG/ML IJ SOLN
1.0000 ug/kg/h | INTRAVENOUS | Status: DC | PRN
  Filled 2014-07-21: qty 2

## 2014-07-21 MED ORDER — SCOPOLAMINE 1 MG/3DAYS TD PT72
MEDICATED_PATCH | TRANSDERMAL | Status: AC
  Filled 2014-07-21: qty 1

## 2014-07-21 MED ORDER — NALBUPHINE HCL 10 MG/ML IJ SOLN: 5.0000 mg | INTRAMUSCULAR | Status: DC | PRN

## 2014-07-21 MED ORDER — OXYTOCIN 40 UNITS IN LACTATED RINGERS INFUSION - SIMPLE MED: 62.5000 mL/h | INTRAVENOUS | Status: AC

## 2014-07-21 MED ORDER — MORPHINE SULFATE 0.5 MG/ML IJ SOLN
INTRAMUSCULAR | Status: AC
  Filled 2014-07-21: qty 10

## 2014-07-21 MED ORDER — SCOPOLAMINE 1 MG/3DAYS TD PT72: 1.0000 | MEDICATED_PATCH | Freq: Once | TRANSDERMAL | Status: DC

## 2014-07-21 MED ORDER — LANOLIN HYDROUS EX OINT
1.0000 | TOPICAL_OINTMENT | CUTANEOUS | Status: DC | PRN
Start: 2014-07-21 — End: 2014-07-24

## 2014-07-21 MED ORDER — DIBUCAINE 1 % RE OINT: 1.0000 "application " | TOPICAL_OINTMENT | RECTAL | Status: DC | PRN

## 2014-07-21 MED ORDER — ONDANSETRON HCL 4 MG/2ML IJ SOLN
INTRAMUSCULAR | Status: AC
  Filled 2014-07-21: qty 2

## 2014-07-21 MED ORDER — MEPERIDINE HCL 25 MG/ML IJ SOLN
INTRAMUSCULAR | Status: AC
  Filled 2014-07-21: qty 1

## 2014-07-21 MED ORDER — FAMOTIDINE IN NACL 20-0.9 MG/50ML-% IV SOLN
20.0000 mg | Freq: Once | INTRAVENOUS | Status: DC
  Filled 2014-07-21: qty 50

## 2014-07-21 MED ORDER — OXYTOCIN 10 UNIT/ML IJ SOLN
INTRAMUSCULAR | Status: AC
  Filled 2014-07-21: qty 4

## 2014-07-21 MED ORDER — OXYTOCIN 10 UNIT/ML IJ SOLN
40.0000 [IU] | INTRAVENOUS | Status: DC | PRN
  Administered 2014-07-21: 40 [IU] via INTRAVENOUS

## 2014-07-21 MED ORDER — METOCLOPRAMIDE HCL 5 MG/ML IJ SOLN
INTRAMUSCULAR | Status: DC | PRN
  Administered 2014-07-21: 10 mg via INTRAVENOUS

## 2014-07-21 MED ORDER — LACTATED RINGERS IV SOLN
INTRAVENOUS | Status: DC | PRN
  Administered 2014-07-21 (×2): via INTRAVENOUS

## 2014-07-21 MED ORDER — DIPHENHYDRAMINE HCL 25 MG PO CAPS: 25.0000 mg | ORAL_CAPSULE | ORAL | Status: DC | PRN

## 2014-07-21 MED ORDER — SIMETHICONE 80 MG PO CHEW: 80.0000 mg | CHEWABLE_TABLET | ORAL | Status: DC | PRN

## 2014-07-21 MED ORDER — NALBUPHINE HCL 10 MG/ML IJ SOLN
5.0000 mg | Freq: Once | INTRAMUSCULAR | Status: AC | PRN
Start: 2014-07-21 — End: 2014-07-21
  Administered 2014-07-21: 5 mg via INTRAVENOUS
  Filled 2014-07-21: qty 1

## 2014-07-21 MED ORDER — DEXTROSE 5 % IV SOLN
INTRAVENOUS | Status: AC
  Filled 2014-07-21: qty 3000

## 2014-07-21 MED ORDER — SODIUM BICARBONATE 8.4 % IV SOLN
INTRAVENOUS | Status: AC
  Filled 2014-07-21: qty 50

## 2014-07-21 MED ORDER — OXYTOCIN 40 UNITS IN LACTATED RINGERS INFUSION - SIMPLE MED
1.0000 m[IU]/min | INTRAVENOUS | Status: DC
  Administered 2014-07-21: 12 m[IU]/min via INTRAVENOUS

## 2014-07-21 MED ORDER — MENTHOL 3 MG MT LOZG: 1.0000 | LOZENGE | OROMUCOSAL | Status: DC | PRN

## 2014-07-21 MED ORDER — NALBUPHINE HCL 10 MG/ML IJ SOLN
5.0000 mg | Freq: Once | INTRAMUSCULAR | Status: AC | PRN
Start: 2014-07-21 — End: 2014-07-21

## 2014-07-21 MED ORDER — MORPHINE SULFATE (PF) 0.5 MG/ML IJ SOLN
INTRAMUSCULAR | Status: DC | PRN
  Administered 2014-07-21: 4 mg via EPIDURAL

## 2014-07-21 MED ORDER — SODIUM BICARBONATE 8.4 % IV SOLN
INTRAVENOUS | Status: DC | PRN
  Administered 2014-07-21 (×2): 5 mL via EPIDURAL

## 2014-07-21 MED ORDER — LIDOCAINE-EPINEPHRINE (PF) 2 %-1:200000 IJ SOLN
INTRAMUSCULAR | Status: AC
  Filled 2014-07-21: qty 20

## 2014-07-21 MED ORDER — CEFAZOLIN SODIUM 1-5 GM-% IV SOLN: INTRAVENOUS | Status: DC | PRN

## 2014-07-21 MED ORDER — LACTATED RINGERS IV SOLN
INTRAVENOUS | Status: DC
  Administered 2014-07-21: 500 mL via INTRAUTERINE

## 2014-07-21 MED ORDER — BUPIVACAINE HCL (PF) 0.25 % IJ SOLN
INTRAMUSCULAR | Status: AC
  Filled 2014-07-21: qty 10

## 2014-07-21 MED ORDER — SODIUM CHLORIDE 0.9 % IJ SOLN: 3.0000 mL | INTRAMUSCULAR | Status: DC | PRN

## 2014-07-21 MED ORDER — SIMETHICONE 80 MG PO CHEW
80.0000 mg | CHEWABLE_TABLET | ORAL | Status: DC
  Administered 2014-07-22 – 2014-07-23 (×3): 80 mg via ORAL
  Filled 2014-07-21 (×3): qty 1

## 2014-07-21 MED ORDER — NALBUPHINE HCL 10 MG/ML IJ SOLN
5.0000 mg | INTRAMUSCULAR | Status: DC | PRN
  Administered 2014-07-21 – 2014-07-22 (×2): 5 mg via INTRAVENOUS
  Filled 2014-07-21 (×2): qty 1

## 2014-07-21 MED ORDER — LACTATED RINGERS IV SOLN
INTRAVENOUS | Status: DC | PRN
  Administered 2014-07-21: 10:00:00 via INTRAVENOUS

## 2014-07-21 MED ORDER — SCOPOLAMINE 1 MG/3DAYS TD PT72
MEDICATED_PATCH | TRANSDERMAL | Status: DC | PRN
  Administered 2014-07-21: 1 via TRANSDERMAL

## 2014-07-21 MED ORDER — ONDANSETRON HCL 4 MG/2ML IJ SOLN: 4.0000 mg | INTRAMUSCULAR | Status: DC | PRN

## 2014-07-21 MED ORDER — SIMETHICONE 80 MG PO CHEW
80.0000 mg | CHEWABLE_TABLET | Freq: Three times a day (TID) | ORAL | Status: DC
  Administered 2014-07-22 – 2014-07-24 (×8): 80 mg via ORAL
  Filled 2014-07-21 (×8): qty 1

## 2014-07-21 MED ORDER — BUPIVACAINE LIPOSOME 1.3 % IJ SUSP
20.0000 mL | Freq: Once | INTRAMUSCULAR | Status: DC
  Filled 2014-07-21: qty 20

## 2014-07-21 MED ORDER — MEPERIDINE HCL 25 MG/ML IJ SOLN
INTRAMUSCULAR | Status: DC | PRN
  Administered 2014-07-21: 12.5 mg via INTRAVENOUS

## 2014-07-21 MED ORDER — DEXTROSE 5 % IV SOLN
3.0000 g | INTRAVENOUS | Status: DC | PRN
  Administered 2014-07-21: 3 g via INTRAVENOUS

## 2014-07-21 MED ORDER — DIPHENHYDRAMINE HCL 25 MG PO CAPS: 25.0000 mg | ORAL_CAPSULE | Freq: Four times a day (QID) | ORAL | Status: DC | PRN

## 2014-07-21 MED ORDER — LACTATED RINGERS IV SOLN
INTRAVENOUS | Status: DC
  Administered 2014-07-21: 18:00:00 via INTRAVENOUS

## 2014-07-21 MED ORDER — MEPERIDINE HCL 25 MG/ML IJ SOLN: 6.2500 mg | INTRAMUSCULAR | Status: DC | PRN

## 2014-07-21 MED ORDER — IBUPROFEN 600 MG PO TABS
600.0000 mg | ORAL_TABLET | Freq: Four times a day (QID) | ORAL | Status: DC
  Administered 2014-07-22 – 2014-07-24 (×11): 600 mg via ORAL
  Filled 2014-07-21 (×11): qty 1

## 2014-07-21 MED ORDER — NALOXONE HCL 0.4 MG/ML IJ SOLN: 0.4000 mg | INTRAMUSCULAR | Status: DC | PRN

## 2014-07-21 MED ORDER — GENTAMICIN SULFATE 40 MG/ML IJ SOLN
INTRAMUSCULAR | Status: DC
  Filled 2014-07-21: qty 13

## 2014-07-21 MED ORDER — BUPIVACAINE HCL 0.25 % IJ SOLN
INTRAMUSCULAR | Status: DC | PRN
  Administered 2014-07-21: 10 mL

## 2014-07-21 MED ORDER — WITCH HAZEL-GLYCERIN EX PADS: 1.0000 "application " | MEDICATED_PAD | CUTANEOUS | Status: DC | PRN

## 2014-07-21 MED ORDER — ONDANSETRON HCL 4 MG/2ML IJ SOLN
INTRAMUSCULAR | Status: DC | PRN
  Administered 2014-07-21: 4 mg via INTRAVENOUS

## 2014-07-21 MED ORDER — TETANUS-DIPHTH-ACELL PERTUSSIS 5-2.5-18.5 LF-MCG/0.5 IM SUSP
0.5000 mL | Freq: Once | INTRAMUSCULAR | Status: DC
Start: 2014-07-22 — End: 2014-07-24

## 2014-07-21 SURGICAL SUPPLY — 31 items
CLAMP CORD UMBIL (MISCELLANEOUS) IMPLANT
CLIP FILSHIE TUBAL LIGA STRL (Clip) ×2 IMPLANT
CLOTH BEACON ORANGE TIMEOUT ST (SAFETY) ×3 IMPLANT
DRAPE SHEET LG 3/4 BI-LAMINATE (DRAPES) IMPLANT
DRSG OPSITE POSTOP 4X10 (GAUZE/BANDAGES/DRESSINGS) ×3 IMPLANT
DRSG OPSITE POSTOP 4X12 (GAUZE/BANDAGES/DRESSINGS) ×2 IMPLANT
DURAPREP 26ML APPLICATOR (WOUND CARE) ×3 IMPLANT
ELECT REM PT RETURN 9FT ADLT (ELECTROSURGICAL) ×3
ELECTRODE REM PT RTRN 9FT ADLT (ELECTROSURGICAL) ×1 IMPLANT
EXTRACTOR VACUUM M CUP 4 TUBE (SUCTIONS) IMPLANT
EXTRACTOR VACUUM M CUP 4' TUBE (SUCTIONS)
GLOVE BIO SURGEON STRL SZ7 (GLOVE) ×3 IMPLANT
GOWN STRL REUS W/TWL LRG LVL3 (GOWN DISPOSABLE) ×6 IMPLANT
KIT ABG SYR 3ML LUER SLIP (SYRINGE) IMPLANT
LIQUID BAND (GAUZE/BANDAGES/DRESSINGS) ×2 IMPLANT
NDL HYPO 25X5/8 SAFETYGLIDE (NEEDLE) IMPLANT
NEEDLE HYPO 25X5/8 SAFETYGLIDE (NEEDLE) IMPLANT
NS IRRIG 1000ML POUR BTL (IV SOLUTION) ×3 IMPLANT
PACK C SECTION WH (CUSTOM PROCEDURE TRAY) ×3 IMPLANT
PAD OB MATERNITY 4.3X12.25 (PERSONAL CARE ITEMS) ×3 IMPLANT
RTRCTR C-SECT PINK 25CM LRG (MISCELLANEOUS) ×3 IMPLANT
STAPLER VISISTAT 35W (STAPLE) IMPLANT
SUT CHROMIC 1 CTX 36 (SUTURE) ×8 IMPLANT
SUT CHROMIC 2 0 CT 1 (SUTURE) ×3 IMPLANT
SUT PDS AB 0 CTX 60 (SUTURE) ×3 IMPLANT
SUT PLAIN 2 0 XLH (SUTURE) ×2 IMPLANT
SUT VIC AB 2-0 CT1 27 (SUTURE) ×3
SUT VIC AB 2-0 CT1 TAPERPNT 27 (SUTURE) ×1 IMPLANT
SUT VIC AB 4-0 KS 27 (SUTURE) ×4 IMPLANT
TOWEL OR 17X24 6PK STRL BLUE (TOWEL DISPOSABLE) ×3 IMPLANT
TRAY FOLEY CATH 14FR (SET/KITS/TRAYS/PACK) ×3 IMPLANT

## 2014-07-21 NOTE — Progress Notes (Signed)
Patient's blood pressure 150/58, pulse 84, at 2015.  Patient asymptomatic.  Per hospital protocol & orders to notify physician for BP greater than 140/90, I called and spoke by phone with Dr. Waynard ReedsKendra Ross to inform.  Per Dr. Tenny Crawoss, no new orders, to contact provider only if blood pressure greater than 160/110 with symptoms.  Wolfgang PhoenixLeigha Nicole Defino RN 07/21/14 2035

## 2014-07-21 NOTE — Anesthesia Postprocedure Evaluation (Signed)
  Anesthesia Post-op Note  Patient: Kimberly BlockerKendra N Ransford  Procedure(s) Performed: Procedure(s): CESAREAN SECTION (N/A)  Patient Location: Mother/Baby  Anesthesia Type:Epidural  Level of Consciousness: awake  Airway and Oxygen Therapy: Patient Spontanous Breathing  Post-op Pain: mild  Post-op Assessment: Patient's Cardiovascular Status Stable and Respiratory Function Stable  Post-op Vital Signs: stable  Last Vitals:  Filed Vitals:   07/21/14 1254  BP: 153/91  Pulse: 97  Temp: 37.2 C  Resp: 20    Complications: No apparent anesthesia complications

## 2014-07-21 NOTE — Transfer of Care (Signed)
Immediate Anesthesia Transfer of Care Note  Patient: Kimberly [name]  Procedure(s) Performed: Procedure(s): CESAREAN SECTION (N/A)  Patient Location: PACU  Anesthesia Type:Epidural  Level of Consciousness: awake, alert  and oriented  Airway & Oxygen Therapy: Patient Spontanous Breathing  Post-op Assessment: Report given to RN and Post -op Vital signs reviewed and stable  Post vital signs: Reviewed and stable  Last Vitals:  Filed Vitals:   07/21/14 0801  BP: 140/47  Pulse: 126  Temp:   Resp:     Complications: No apparent anesthesia complications

## 2014-07-21 NOTE — Progress Notes (Signed)
Called Dr Dareen PianoAnderson to review strip; MD on unit at this time; strip reviewed; ordered to restart pitocin at 0700 at 12 mu/min and to recheck SVE in 2 hours

## 2014-07-21 NOTE — Addendum Note (Signed)
Addendum  created 07/21/14 1317 by Renford DillsJanet L Deanne Bedgood, CRNA   Modules edited: Notes Section   Notes Section:  File: [id number]

## 2014-07-21 NOTE — Progress Notes (Signed)
Patient ID: Kimberly [name], female   DOB: 11/13/1990, 24 y.o.   MRN: [id number]  S. Pt uncomfortable and tired O:  Filed Vitals:   07/21/14 0631 07/21/14 0731 07/21/14 0737 07/21/14 0801  BP: 145/91 166/116 162/97 140/47  Pulse: 120 122 117 126  Temp:      TempSrc:      Resp: 18     Height:      Weight:      SpO2:        AOX3, NAD Obese gravid CVX 4/70/-1 edematous toco Q3-5 FHR 130s w/ intermittant sever variables w/ amnioinfusion   A/P 1) Minimal cervical change since last exam @ 0600. Discussed findings with patient and husband. Discussed with patient that overall FWB reassuring with moderate variability and accellerations between variables. Pt states she no longer wishes to proceed with trial of labor and wishes to proceed with repeat cesarean and bilateral tubal ligation. R/B/A reviewed. Will proceed.

## 2014-07-21 NOTE — Progress Notes (Signed)
Pt made no change throughout the day. She had > 200 MDUs thruoughout the day. She had an occ variable decel throughout the day,2/3. At apporx. 4 am she had an amnoinfusion started because she had deeper and consistent variable decels. The pitocin was stopped at 6:30. She was rechecked. Her cx had changed to 4 cm. Given that the FHTs have good variability and that there are accels will restart the pitocin. Ctxs are now not adequate. Will recheck the pt in 2 hours. If no change or deep decels recur will need to perform a repeat C/S. Pt aware/

## 2014-07-21 NOTE — Anesthesia Postprocedure Evaluation (Signed)
Anesthesia Post Note  Patient: Kimberly [name]  Procedure(s) Performed: Procedure(s) (LRB): CESAREAN SECTION (N/A)  Anesthesia type: Epidural  Patient location: PACU  Post pain: Pain level controlled  Post assessment: Post-op Vital signs reviewed  Last Vitals:  Filed Vitals:   07/21/14 1230  BP: 151/59  Pulse: 107  Temp:   Resp: 19    Post vital signs: Reviewed  Level of consciousness: awake  Complications: No apparent anesthesia complications

## 2014-07-21 NOTE — Op Note (Signed)
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 Waynard ReedsKendra Librado Guandique Assistant: Dr. Malva LimesMark Anderson Operative Findings: Vigorous female infant in the OP presentation with apgars of 9 & 9. Weight pending. Clear amniotic fluid. Difficult entry due to extensive adhesive disease. Normal tubes and ovaries bilaterally.  Anesthesia: Epidural and subcutaneous Exparel diluted in 0.25% marcaine with a total of 30 cc injected at the incision site prior to skin closure Specimen: Placenta for disposal EBL: Total I/O In: 2600 [I.V.:2600] Out: 1050 [Urine:300; Blood:750]   Procedure: Kimberly [name] is an 24 year old gravida 5 para 0131 at 6638 weeks and 4 days estimated gestational age who presents for cesarean section. The patient was admitted 2 days prior for an induction of labor for gestational hypertension. She has a history of a prior cesarean section at 29 weeks 4 severe preeclampsia but desired a trial of labor . She made it to 4 cm dilated. The fetus was noted to have intermittent severe variable decelerations. However otherwise the fetal tracing was reassuring. The patient desired to proceed with cesarean section instead of pursuing further trial of labor. Following the appropriate informed consent the patient was brought to the operating room where epidural anesthesia was administered and found to be adequate. She was placed in the dorsal supine position with a leftward tilt. She was prepped and draped in the normal sterile fashion. Scalpel was then used to make a Pfannenstiel skin incision which  was carried down to the underlying layers of soft tissue to the fascia. The fascia was incised in the midline and the fascial incision was extended laterally with Mayo scissors. The superior aspect of the fascial incision was grasped with Coker clamps x2, tented up and the rectus muscles dissected off sharply with the electrocautery unit area and the same procedure was repeated on the inferior aspect of the fascial incision. The rectus muscles were separated in the midline with some difficulty due to scar tissue. After lysis of adhesive disease the abdominal peritoneum was identified, tented up, entered sharply, and the incision was extended superiorly and inferiorly with good visualization of the bladder. Extensive adhesion of the omentum to the anterior peritoneum was noted and had to be taken down in order to continue with the procedure. This was accomplished with both electrocautery and sharp dissection. The Alexis retractor was then deployed. The vesicouterine peritoneum was identified, tented up, entered sharply, and the bladder flap was created digitally. Scalpel was then used to make a low transverse incision on the uterus which was extended laterally with blunt dissection. The fetal vertex was identified, delivered easily through the uterine incision followed by the body. The infant was bulb suctioned on the operative field cried vigorously, cord was clamped and cut and the infant was passed to the waiting neonatologist. Placenta was then delivered spontaneously, the uterus was cleared of all clot and debris. The uterine incision was repaired with #1 chromic in running locked fashion. The ovaries and tubes were inspected and normal. Attention was turned to the tubal ligation portion of the case. The left tube was identified and followed out to the fimbriated end. The tube was elevated with a babcock clamp and a filshie clip was  placed and noted to completely occlude the tube. The same procedure was repeated  on the right tube. The Alexis retractor was removed. The omentum and areas of dissection were inspected for hemostasis. The abdominal peritoneum was reapproximated with 2-0 Vicryl in a running fashion, the rectus muscles was reapproximated with 2-0 chromic in a running fashion. The subcutaneous tissue was closed with 2-0 plain gut. The subcutaneous tissue was injected with 30 cc of Exparel in 0.25% marcaine.  The fascia was closed with 0- looped PDS in a running fashion. The skin was closed with 4-0 vicryl in a subcuticular fashion and surgical skin glue. All sponge lap and needle counts were correct x2. Patient tolerated the procedure well and recovered in stable condition following the procedure.

## 2014-07-21 NOTE — Progress Notes (Signed)
Patient c/o being able to feel contractions again; Dr Gentry RochJudd notified and will assess patient as soon as possible

## 2014-07-21 NOTE — Lactation Note (Signed)
This note was copied from the chart of Boy Chestine SporeKendra Teague. Lactation Consultation Note  Patient Name: Boy Chestine SporeKendra Rud UJWJX'BToday's Date: 07/21/2014 Reason for consult: Initial assessment;Other (Comment) (pumping only) Mom has one older child, born premature and <2 pounds and she pumped and provided ebm to that baby for 7 months.  She is planning to pump and bottle-feed her new baby and does not want to latch baby to breast.  DEBP has been initiated by RN and mom says she is already obtainng some milk.  Mom says she knows how to hand express.  LC encouraged q3h pumping combined with hand expression and breast massage to enhance milk flow and production.  LC also reviewed milk storage guidelines for ebm (Baby and Me, page 25) and encouraged frequent STS which also stimulates mom's milk production and is good for her and her baby.Mom encouraged to feed baby 8-12 times/24 hours and with feeding cues. LC encouraged review of Baby and Me pp 9, 14 and 20-25 for STS and BF information. LC provided [employer] Resource brochure and reviewed [facility] services and list of community and web site resources.    Maternal Data Formula Feeding for Exclusion: Yes Reason for exclusion: Mother's choice to formula and breast feed on admission Has patient been taught Hand Expression?: Yes (mom says she remembers how to hand express) Does the patient have breastfeeding experience prior to this delivery?: Yes  Feeding    LATCH Score/Interventions           N/A - pumping only           Lactation Tools Discussed/Used Pump Review: Milk Storage Initiated by:: RN initiated Date initiated:: 07/21/14 STS, hand expression and breast massage to enhance milk flow and production Cue feedings  Consult Status Consult Status: Follow-up Date: 07/22/14 Follow-up type: In-patient    Warrick ParisianBryant, Nusaiba Guallpa [facility] 07/21/2014, 10:08 PM

## 2014-07-22 ENCOUNTER — Encounter (HOSPITAL_COMMUNITY): Payer: Self-pay | Admitting: Obstetrics and Gynecology

## 2014-07-22 LAB — CBC
HCT: 26 % — ABNORMAL LOW (ref 36.0–46.0)
Hemoglobin: 8.5 g/dL — ABNORMAL LOW (ref 12.0–15.0)
MCH: 23.8 pg — AB (ref 26.0–34.0)
MCHC: 32.7 g/dL (ref 30.0–36.0)
MCV: 72.8 fL — AB (ref 78.0–100.0)
PLATELETS: 212 10*3/uL (ref 150–400)
RBC: 3.57 MIL/uL — ABNORMAL LOW (ref 3.87–5.11)
RDW: 14.4 % (ref 11.5–15.5)
WBC: 14.5 10*3/uL — ABNORMAL HIGH (ref 4.0–10.5)

## 2014-07-22 LAB — BIRTH TISSUE RECOVERY COLLECTION (PLACENTA DONATION)

## 2014-07-22 LAB — CCBB MATERNAL DONOR DRAW

## 2014-07-22 NOTE — Progress Notes (Signed)
Patient is doing well.  She is tolerating PO, ambulating, voiding.  Pain is controlled.  Lochia is appropriate.    Filed Vitals:   07/21/14 1802 07/21/14 2000 07/22/14 0000 07/22/14 0344  BP: 145/85 150/88 159/92 145/74  Pulse:  84 97 91  Temp:   98.5 F (36.9 C) 98.2 F (36.8 C)  TempSrc:   Oral Oral  Resp:  22 22 22   Height:      Weight:      SpO2:  97% 95% 96%    NAD Lungs:   clear to auscultation Heart:   RRR Abdomen:  soft, obeses appropriate tenderness, incisions intact and without erythema.  Mild serosanginous drainage, left edge, not active ext:    Symmetric, 1+ edema bilaterally, + SCDs  Lab Results  Component Value Date   WBC 14.5* 07/22/2014   HGB 8.5* 07/22/2014   HCT 26.0* 07/22/2014   MCV 72.8* 07/22/2014   PLT 212 07/22/2014    --/--/A POS (02/02 1625)/RImmune  A/P    24 y.o. [id number] POD 1 s/p RCS 2/2 failed TOLAC/failed IOL for GHTN Routine post op and postpartum care.   GHTN: Bps mild range since delivery.  Asymptomatic without evidence of PP preE.  Will monitor closely. Morbid obesity: SCDs while in bed Circ as outpatient.

## 2014-07-22 NOTE — Lactation Note (Signed)
This note was copied from the chart of Kimberly Chestine SporeKendra Robak. Lactation Consultation Note  Follow up visit done.  Mom is pumping and hand expressing every 3 hours and obtaining a few mls of colostrum.  Reviewed hand expression and milk easily visible.  [facility]  Encouraged to call with concerns.  Patient Name: Kimberly Chestine SporeKendra Escue ZOXWR'UToday's Date: 07/22/2014     Maternal Data    Feeding    LATCH Score/Interventions                      Lactation Tools Discussed/Used     Consult Status      Huston FoleyMOULDEN, Jeweline Reif S 07/22/2014, 2:37 PM

## 2014-07-23 NOTE — Lactation Note (Signed)
This note was copied from the chart of Kimberly [name] Jaros. Lactation Consultation Note Mom is pumping and bottle feeding formula. Not producing anything yet to give baby. When milk comes in will give to baby in bottle. Encouraged pumping every three hours to stimulate milk for supply and demand. Mom and baby not being d/c home today. Has no questions or concerns for LC. Patient Name: Kimberly [name] Luth ZOXWR'UToday's Date: 07/23/2014 Reason for consult: Follow-up assessment   Maternal Data    Feeding Feeding Type: Bottle Fed - Formula Nipple Type: Slow - flow  LATCH Score/Interventions                      Lactation Tools Discussed/Used Tools: Bottle   Consult Status Consult Status: Follow-up Date: 07/24/14 Follow-up type: In-patient    Charyl DancerCARVER, Tyiesha Brackney G 07/23/2014, 8:53 AM

## 2014-07-23 NOTE — Progress Notes (Signed)
Patient is doing well.  She is tolerating PO, ambulating, voiding.  Pain is controlled.  Lochia is appropriate.   BPs were mildly elevated yesterday in the 140-150/80-90s but have trended down to 120-130s/70-90.  She remains asymptomatic without HA/BV/RUQ pain.    Filed Vitals:   07/22/14 0344 07/22/14 1725 07/22/14 2005 07/23/14 0617  BP: 145/74 125/94 138/93 133/48  Pulse: 91 104 95 101  Temp: 98.2 F (36.8 C) 98.6 F (37 C) 98 F (36.7 C) 98 F (36.7 C)  TempSrc: Oral Oral Oral Oral  Resp: 22 20 20 20   Height:      Weight:      SpO2: 96%       NAD Abdomen:  soft, obeses appropriate tenderness, incisions intact and without erythema.   ext:    Symmetric, 1+ edema bilaterally, + SCDs  Lab Results  Component Value Date   WBC 14.5* 07/22/2014   HGB 8.5* 07/22/2014   HCT 26.0* 07/22/2014   MCV 72.8* 07/22/2014   PLT 212 07/22/2014    --/--/A POS (02/02 1625)/RImmune  A/P    24 y.o. [id number] POD #2 s/p RCS 2/2 failed TOLAC/failed IOL for GHTN Routine post op and postpartum care.   GHTN: BPs mildly elevated yesterday, have trended down to 120-130/70-80s.  No indication for oral antihypertensive at this time.  Asymptomatic without evidence of PP preE.  Will monitor closely. Morbid obesity: SCDs while in bed Circ as outpatient.  Desires d/c tomorrow.

## 2014-07-24 MED ORDER — OXYCODONE-ACETAMINOPHEN 5-325 MG PO TABS: 1.0000 | ORAL_TABLET | ORAL | Status: DC | PRN

## 2014-07-24 NOTE — Discharge Summary (Signed)
Obstetric Discharge Summary Reason for Admission: induction of labor Prenatal Procedures: NST Intrapartum Procedures: cesarean: low cervical, transverse and tubal ligation Postpartum Procedures: none Complications-Operative and Postpartum: none HEMOGLOBIN  Date Value Ref Range Status  07/22/2014 8.5* 12.0 - 15.0 g/dL Final    Comment:    DELTA CHECK NOTED REPEATED TO VERIFY    HGB  Date Value Ref Range Status  02/06/2009 10.8* 11.6 - 15.9 g/dL Final   HCT  Date Value Ref Range Status  07/22/2014 26.0* 36.0 - 46.0 % Final  02/06/2009 33.2* 34.8 - 46.6 % Final    Discharge Diagnoses: Term Pregnancy-delivered and PIH  Discharge Information: Date: 07/24/2014 Activity: pelvic rest Diet: routine Medications: Iron and Percocet Condition: stable Instructions: refer to practice specific booklet Discharge to: home Follow-up Information    Follow up with Almon HerculesOSS,Toria H., MD In 4 weeks.   Specialty:  Obstetrics and Gynecology   Contact information:   [address] [place] [address] [phone]       Newborn Data: Live born female  Birth Weight: 6 lb 2.6 oz (2795 g) APGAR: 9, 9  Home with mother.  Sharvil Hoey A 07/24/2014, 8:47 AM

## 2014-07-24 NOTE — Lactation Note (Signed)
This note was copied from the chart of Boy Chestine SporeKendra Cassaday. Lactation Consultation Note: Follow up visit with mom before DC. Mom reports pumping is going well. Has about 30 cc's transitional milk at bedside. Reports she pumped q 3 hours during the day yesterday and twice during the night. Last pumped at 6 am. [facility]- states she will use her single pump until tomorrow. No questions at present. To call prn  Patient Name: Boy Chestine SporeKendra Schlechter ZOXWR'UToday's Date: 07/24/2014 Reason for consult: Follow-up assessment   Maternal Data Formula Feeding for Exclusion: No Does the patient have breastfeeding experience prior to this delivery?: Yes  Feeding   LATCH Score/Interventions                      Lactation Tools Discussed/Used     Consult Status Consult Status: Complete    Pamelia HoitWeeks, Islah Eve D 07/24/2014, 7:46 AM

## 2014-07-24 NOTE — Progress Notes (Signed)
  Patient is eating, ambulating, voiding.  Pain control is good.  Filed Vitals:   07/23/14 0617 07/23/14 1843 07/24/14 0500 07/24/14 0829  BP: 133/48 137/84 163/74 140/74  Pulse: 101 116 108 102  Temp: 98 F (36.7 C) 98 F (36.7 C) 98.3 F (36.8 C)   TempSrc: Oral Oral    Resp: 20 20 20    Height:      Weight:      SpO2:        lungs:   clear to auscultation cor:    RRR Abdomen:  soft, appropriate tenderness, incisions intact and without erythema or exudate ex:    no cords   Lab Results  Component Value Date   WBC 14.5* 07/22/2014   HGB 8.5* 07/22/2014   HCT 26.0* 07/22/2014   MCV 72.8* 07/22/2014   PLT 212 07/22/2014    --/--/A POS (02/02 1625)/RI  A/P    Post operative day 3.  Routine post op and postpartum care.  Expect d/c today.  Percocet for pain control.

## 2014-07-26 ENCOUNTER — Emergency Department (HOSPITAL_COMMUNITY): Payer: Medicaid Other

## 2014-07-26 ENCOUNTER — Inpatient Hospital Stay (HOSPITAL_COMMUNITY): Payer: Medicaid Other

## 2014-07-26 ENCOUNTER — Other Ambulatory Visit: Payer: Self-pay

## 2014-07-26 ENCOUNTER — Encounter (HOSPITAL_COMMUNITY): Payer: Self-pay

## 2014-07-26 ENCOUNTER — Inpatient Hospital Stay (HOSPITAL_COMMUNITY)
Admission: EM | Admit: 2014-07-26 | Discharge: 2014-07-29 | DRG: 871 | Disposition: A | Discharge status: Home / Self Care | Payer: Medicaid Other | Attending: Internal Medicine | Admitting: Internal Medicine

## 2014-07-26 DIAGNOSIS — Z79899 Other long term (current) drug therapy: Secondary | ICD-10-CM | POA: Diagnosis not present

## 2014-07-26 DIAGNOSIS — J189 Pneumonia, unspecified organism: Secondary | ICD-10-CM | POA: Insufficient documentation

## 2014-07-26 DIAGNOSIS — Z6841 Body Mass Index (BMI) 40.0 and over, adult: Secondary | ICD-10-CM | POA: Diagnosis not present

## 2014-07-26 DIAGNOSIS — Z833 Family history of diabetes mellitus: Secondary | ICD-10-CM

## 2014-07-26 DIAGNOSIS — Z91018 Allergy to other foods: Secondary | ICD-10-CM | POA: Diagnosis not present

## 2014-07-26 DIAGNOSIS — E876 Hypokalemia: Secondary | ICD-10-CM | POA: Diagnosis present

## 2014-07-26 DIAGNOSIS — R319 Hematuria, unspecified: Secondary | ICD-10-CM | POA: Diagnosis present

## 2014-07-26 DIAGNOSIS — A419 Sepsis, unspecified organism: Principal | ICD-10-CM | POA: Diagnosis present

## 2014-07-26 DIAGNOSIS — J9601 Acute respiratory failure with hypoxia: Secondary | ICD-10-CM | POA: Diagnosis present

## 2014-07-26 DIAGNOSIS — Z823 Family history of stroke: Secondary | ICD-10-CM | POA: Diagnosis not present

## 2014-07-26 DIAGNOSIS — J154 Pneumonia due to other streptococci: Secondary | ICD-10-CM | POA: Diagnosis present

## 2014-07-26 DIAGNOSIS — I10 Essential (primary) hypertension: Secondary | ICD-10-CM | POA: Diagnosis present

## 2014-07-26 DIAGNOSIS — D72829 Elevated white blood cell count, unspecified: Secondary | ICD-10-CM | POA: Diagnosis present

## 2014-07-26 DIAGNOSIS — R072 Precordial pain: Secondary | ICD-10-CM | POA: Diagnosis present

## 2014-07-26 DIAGNOSIS — Z880 Allergy status to penicillin: Secondary | ICD-10-CM | POA: Diagnosis not present

## 2014-07-26 DIAGNOSIS — J90 Pleural effusion, not elsewhere classified: Secondary | ICD-10-CM | POA: Diagnosis present

## 2014-07-26 DIAGNOSIS — Z91040 Latex allergy status: Secondary | ICD-10-CM | POA: Diagnosis not present

## 2014-07-26 DIAGNOSIS — D649 Anemia, unspecified: Secondary | ICD-10-CM | POA: Diagnosis not present

## 2014-07-26 DIAGNOSIS — Z79891 Long term (current) use of opiate analgesic: Secondary | ICD-10-CM | POA: Diagnosis not present

## 2014-07-26 DIAGNOSIS — Z881 Allergy status to other antibiotic agents status: Secondary | ICD-10-CM

## 2014-07-26 DIAGNOSIS — J918 Pleural effusion in other conditions classified elsewhere: Secondary | ICD-10-CM

## 2014-07-26 DIAGNOSIS — Y95 Nosocomial condition: Secondary | ICD-10-CM | POA: Diagnosis present

## 2014-07-26 DIAGNOSIS — I071 Rheumatic tricuspid insufficiency: Secondary | ICD-10-CM | POA: Diagnosis present

## 2014-07-26 DIAGNOSIS — R0602 Shortness of breath: Secondary | ICD-10-CM | POA: Diagnosis present

## 2014-07-26 DIAGNOSIS — J948 Other specified pleural conditions: Secondary | ICD-10-CM

## 2014-07-26 LAB — URINALYSIS, ROUTINE W REFLEX MICROSCOPIC
Bilirubin Urine: NEGATIVE
GLUCOSE, UA: NEGATIVE mg/dL
KETONES UR: NEGATIVE mg/dL
Nitrite: NEGATIVE
PROTEIN: NEGATIVE mg/dL
Specific Gravity, Urine: 1.007 (ref 1.005–1.030)
Urobilinogen, UA: 1 mg/dL (ref 0.0–1.0)
pH: 7.5 (ref 5.0–8.0)

## 2014-07-26 LAB — TYPE AND SCREEN
ABO/RH(D): A POS
ANTIBODY SCREEN: NEGATIVE

## 2014-07-26 LAB — CBC WITH DIFFERENTIAL/PLATELET
Basophils Absolute: 0 10*3/uL (ref 0.0–0.1)
Basophils Absolute: 0 10*3/uL (ref 0.0–0.1)
Basophils Relative: 0 % (ref 0–1)
Basophils Relative: 0 % (ref 0–1)
EOS PCT: 2 % (ref 0–5)
EOS PCT: 1 % (ref 0–5)
Eosinophils Absolute: 0.2 10*3/uL (ref 0.0–0.7)
Eosinophils Absolute: 0.1 10*3/uL (ref 0.0–0.7)
HCT: 29.9 % — ABNORMAL LOW (ref 36.0–46.0)
HEMATOCRIT: 31 % — AB (ref 36.0–46.0)
HEMOGLOBIN: 9.6 g/dL — AB (ref 12.0–15.0)
Hemoglobin: 9.2 g/dL — ABNORMAL LOW (ref 12.0–15.0)
LYMPHS ABS: 1.7 10*3/uL (ref 0.7–4.0)
Lymphocytes Relative: 15 % (ref 12–46)
Lymphocytes Relative: 16 % (ref 12–46)
Lymphs Abs: 1.8 10*3/uL (ref 0.7–4.0)
MCH: 22.9 pg — ABNORMAL LOW (ref 26.0–34.0)
MCH: 22.5 pg — AB (ref 26.0–34.0)
MCHC: 31 g/dL (ref 30.0–36.0)
MCHC: 30.8 g/dL (ref 30.0–36.0)
MCV: 73.8 fL — ABNORMAL LOW (ref 78.0–100.0)
MCV: 73.3 fL — ABNORMAL LOW (ref 78.0–100.0)
MONOS PCT: 7 % (ref 3–12)
Monocytes Absolute: 0.7 10*3/uL (ref 0.1–1.0)
Monocytes Absolute: 0.8 10*3/uL (ref 0.1–1.0)
Monocytes Relative: 6 % (ref 3–12)
NEUTROS PCT: 76 % (ref 43–77)
Neutro Abs: 8.7 10*3/uL — ABNORMAL HIGH (ref 1.7–7.7)
Neutro Abs: 8.6 10*3/uL — ABNORMAL HIGH (ref 1.7–7.7)
Neutrophils Relative %: 77 % (ref 43–77)
PLATELETS: 389 10*3/uL (ref 150–400)
Platelets: 359 10*3/uL (ref 150–400)
RBC: 4.2 MIL/uL (ref 3.87–5.11)
RBC: 4.08 MIL/uL (ref 3.87–5.11)
RDW: 14.5 % (ref 11.5–15.5)
RDW: 14.4 % (ref 11.5–15.5)
WBC: 11.3 10*3/uL — AB (ref 4.0–10.5)
WBC: 11.3 10*3/uL — AB (ref 4.0–10.5)

## 2014-07-26 LAB — TROPONIN I
TROPONIN I: 0.04 ng/mL — AB (ref <0.031)
Troponin I: 0.03 ng/mL (ref <0.031)
Troponin I: 0.04 ng/mL — ABNORMAL HIGH (ref <0.031)

## 2014-07-26 LAB — COMPREHENSIVE METABOLIC PANEL
ALK PHOS: 109 U/L (ref 39–117)
ALT: 22 U/L (ref 0–35)
ALT: 21 U/L (ref 0–35)
AST: 31 U/L (ref 0–37)
AST: 30 U/L (ref 0–37)
Albumin: 3 g/dL — ABNORMAL LOW (ref 3.5–5.2)
Albumin: 2.9 g/dL — ABNORMAL LOW (ref 3.5–5.2)
Alkaline Phosphatase: 112 U/L (ref 39–117)
Anion gap: 10 (ref 5–15)
Anion gap: 11 (ref 5–15)
BUN: 6 mg/dL (ref 6–23)
BUN: 5 mg/dL — ABNORMAL LOW (ref 6–23)
CHLORIDE: 105 mmol/L (ref 96–112)
CO2: 24 mmol/L (ref 19–32)
CO2: 26 mmol/L (ref 19–32)
CREATININE: 0.54 mg/dL (ref 0.50–1.10)
Calcium: 8.6 mg/dL (ref 8.4–10.5)
Calcium: 8.6 mg/dL (ref 8.4–10.5)
Chloride: 104 mmol/L (ref 96–112)
Creatinine, Ser: 0.63 mg/dL (ref 0.50–1.10)
GFR calc Af Amer: >90 mL/min (ref >90)
GLUCOSE: 116 mg/dL — AB (ref 70–99)
GLUCOSE: 97 mg/dL (ref 70–99)
POTASSIUM: 3 mmol/L — AB (ref 3.5–5.1)
Potassium: 3.2 mmol/L — ABNORMAL LOW (ref 3.5–5.1)
SODIUM: 139 mmol/L (ref 135–145)
Sodium: 141 mmol/L (ref 135–145)
Total Bilirubin: 0.5 mg/dL (ref 0.3–1.2)
Total Bilirubin: 0.9 mg/dL (ref 0.3–1.2)
Total Protein: 7.1 g/dL (ref 6.0–8.3)
Total Protein: 6.7 g/dL (ref 6.0–8.3)

## 2014-07-26 LAB — MAGNESIUM: MAGNESIUM: 1.8 mg/dL (ref 1.5–2.5)

## 2014-07-26 LAB — PROTIME-INR
INR: 1.11 (ref 0.00–1.49)
PROTHROMBIN TIME: 14.4 s (ref 11.6–15.2)

## 2014-07-26 LAB — PHOSPHORUS: Phosphorus: 3.8 mg/dL (ref 2.3–4.6)

## 2014-07-26 LAB — APTT: aPTT: 31 seconds (ref 24–37)

## 2014-07-26 LAB — URINE MICROSCOPIC-ADD ON

## 2014-07-26 LAB — LIPASE, BLOOD: Lipase: 15 U/L (ref 11–59)

## 2014-07-26 LAB — BRAIN NATRIURETIC PEPTIDE: B NATRIURETIC PEPTIDE 5: 135.6 pg/mL — AB (ref 0.0–100.0)

## 2014-07-26 LAB — TSH: TSH: 1.506 u[IU]/mL (ref 0.350–4.500)

## 2014-07-26 MED ORDER — ONDANSETRON HCL 4 MG PO TABS: 4.0000 mg | ORAL_TABLET | Freq: Four times a day (QID) | ORAL | Status: DC | PRN

## 2014-07-26 MED ORDER — SODIUM CHLORIDE 0.9 % IV SOLN: 1000.0000 mL | INTRAVENOUS | Status: AC

## 2014-07-26 MED ORDER — DEXTROSE 5 % IV SOLN
2.0000 g | Freq: Once | INTRAVENOUS | Status: AC
  Administered 2014-07-26: 2 g via INTRAVENOUS
  Filled 2014-07-26: qty 2

## 2014-07-26 MED ORDER — AMLODIPINE BESYLATE 5 MG PO TABS
5.0000 mg | ORAL_TABLET | Freq: Every day | ORAL | Status: DC
  Administered 2014-07-26 – 2014-07-27 (×2): 5 mg via ORAL
  Filled 2014-07-26 (×4): qty 1

## 2014-07-26 MED ORDER — SODIUM CHLORIDE 0.9 % IV SOLN: 1000.0000 mL | INTRAVENOUS | Status: DC

## 2014-07-26 MED ORDER — ACETAMINOPHEN 650 MG RE SUPP: 650.0000 mg | Freq: Four times a day (QID) | RECTAL | Status: DC | PRN

## 2014-07-26 MED ORDER — ONDANSETRON HCL 4 MG/2ML IJ SOLN
4.0000 mg | Freq: Four times a day (QID) | INTRAMUSCULAR | Status: DC | PRN
  Administered 2014-07-26: 4 mg via INTRAVENOUS
  Filled 2014-07-26: qty 2

## 2014-07-26 MED ORDER — ENOXAPARIN SODIUM 30 MG/0.3ML ~~LOC~~ SOLN: 30.0000 mg | SUBCUTANEOUS | Status: DC

## 2014-07-26 MED ORDER — SODIUM CHLORIDE 0.9 % IV BOLUS (SEPSIS)
500.0000 mL | Freq: Once | INTRAVENOUS | Status: AC
  Administered 2014-07-26: 500 mL via INTRAVENOUS

## 2014-07-26 MED ORDER — HYDRALAZINE HCL 20 MG/ML IJ SOLN
5.0000 mg | Freq: Four times a day (QID) | INTRAMUSCULAR | Status: DC | PRN
  Administered 2014-07-26 – 2014-07-27 (×2): 5 mg via INTRAVENOUS
  Filled 2014-07-26 (×3): qty 1

## 2014-07-26 MED ORDER — POTASSIUM CHLORIDE CRYS ER 20 MEQ PO TBCR
40.0000 meq | EXTENDED_RELEASE_TABLET | Freq: Once | ORAL | Status: AC
  Administered 2014-07-26: 40 meq via ORAL
  Filled 2014-07-26: qty 2

## 2014-07-26 MED ORDER — DEXTROSE 5 % IV SOLN: 2.0000 g | Freq: Three times a day (TID) | INTRAVENOUS | Status: DC

## 2014-07-26 MED ORDER — ACETAMINOPHEN 325 MG PO TABS
650.0000 mg | ORAL_TABLET | Freq: Four times a day (QID) | ORAL | Status: DC | PRN
  Administered 2014-07-26: 650 mg via ORAL
  Filled 2014-07-26: qty 2

## 2014-07-26 MED ORDER — IOHEXOL 350 MG/ML SOLN: 100.0000 mL | Freq: Once | INTRAVENOUS | Status: AC | PRN

## 2014-07-26 MED ORDER — DEXTROSE 5 % IV SOLN
2.0000 g | Freq: Three times a day (TID) | INTRAVENOUS | Status: DC
  Administered 2014-07-26 – 2014-07-28 (×5): 2 g via INTRAVENOUS
  Filled 2014-07-26 (×6): qty 2

## 2014-07-26 MED ORDER — ENOXAPARIN SODIUM 40 MG/0.4ML ~~LOC~~ SOLN
40.0000 mg | Freq: Every day | SUBCUTANEOUS | Status: DC
  Administered 2014-07-26 – 2014-07-27 (×2): 40 mg via SUBCUTANEOUS
  Filled 2014-07-26 (×3): qty 0.4

## 2014-07-26 MED ORDER — MORPHINE SULFATE 2 MG/ML IJ SOLN
1.0000 mg | INTRAMUSCULAR | Status: DC | PRN
  Administered 2014-07-26: 1 mg via INTRAVENOUS
  Filled 2014-07-26: qty 1

## 2014-07-26 MED ORDER — IPRATROPIUM-ALBUTEROL 0.5-2.5 (3) MG/3ML IN SOLN: 3.0000 mL | RESPIRATORY_TRACT | Status: DC | PRN

## 2014-07-26 MED ORDER — PRENATAL MULTIVITAMIN CH
1.0000 | ORAL_TABLET | Freq: Every day | ORAL | Status: DC
  Administered 2014-07-27 – 2014-07-28 (×2): 1 via ORAL
  Filled 2014-07-26 (×3): qty 1

## 2014-07-26 MED ORDER — MORPHINE SULFATE 4 MG/ML IJ SOLN
4.0000 mg | Freq: Once | INTRAMUSCULAR | Status: AC
  Administered 2014-07-26: 4 mg via INTRAVENOUS
  Filled 2014-07-26: qty 1

## 2014-07-26 MED ORDER — SODIUM CHLORIDE 0.9 % IJ SOLN
3.0000 mL | Freq: Two times a day (BID) | INTRAMUSCULAR | Status: DC
  Administered 2014-07-27: 3 mL via INTRAVENOUS

## 2014-07-26 MED ORDER — VANCOMYCIN HCL 10 G IV SOLR
1250.0000 mg | Freq: Three times a day (TID) | INTRAVENOUS | Status: DC
  Administered 2014-07-27 (×3): 1250 mg via INTRAVENOUS
  Filled 2014-07-26 (×3): qty 1250

## 2014-07-26 MED ORDER — ONDANSETRON HCL 4 MG/2ML IJ SOLN
4.0000 mg | Freq: Once | INTRAMUSCULAR | Status: AC
  Administered 2014-07-26: 4 mg via INTRAVENOUS
  Filled 2014-07-26: qty 2

## 2014-07-26 MED ORDER — HYDRALAZINE HCL 20 MG/ML IJ SOLN
10.0000 mg | Freq: Once | INTRAMUSCULAR | Status: AC
  Administered 2014-07-26: 10 mg via INTRAVENOUS

## 2014-07-26 MED ORDER — SODIUM CHLORIDE 0.9 % IV SOLN
2500.0000 mg | Freq: Once | INTRAVENOUS | Status: DC
  Filled 2014-07-26: qty 2500

## 2014-07-26 MED ORDER — VANCOMYCIN HCL 10 G IV SOLR
2500.0000 mg | Freq: Once | INTRAVENOUS | Status: AC
  Administered 2014-07-26: 2500 mg via INTRAVENOUS
  Filled 2014-07-26: qty 2500

## 2014-07-26 NOTE — ED Provider Notes (Signed)
CSN: [id number]     Arrival date & time 07/26/14  1056 History   First MD Initiated Contact with Patient 07/26/14 1059     Chief Complaint  Patient presents with  . recent birth, chest pain, SOB      (Consider location/radiation/quality/duration/timing/severity/associated sxs/prior Treatment) HPI The patient ports that she began developing shortness of breath 2 days ago. She reports she feels more short of breath with activity. Yesterday she started noticing chest pain as well. She reports the chest pain is somewhat central but is made worse by deep breaths. She reports she's had a mild amount of cough. She reports subjective fever. She denies pain or swelling in her legs. The patient is status post C-section 5 days ago. She denies she's having any significant amount of pain in her abdomen or drainage or discharge from her wound or vaginal bleeding. She feels in regards to her C-section, everything is healing as she would expect. The patient reports during her pregnancy she had some high blood pressure but it did not require any treatment. She reports she had had a C-section for failure to progress. Past Medical History  Diagnosis Date  . Ovarian cyst     left side  . Hypertension   . Obesity   . Pregnant   . Pregnancy induced hypertension    Past Surgical History  Procedure Laterality Date  . Tonsillectomy    . Wisdom tooth extraction    . Dilation and curettage of uterus    . Cesarean section  04/09/2011    Procedure: CESAREAN SECTION;  Surgeon: Loney Laurence;  Location: [facility];  Service: Gynecology;  Laterality: N/A;  . Knee arthroscopy    . Cesarean section N/A 07/21/2014    Procedure: CESAREAN SECTION;  Surgeon: Freddrick March. Tenny Craw, MD;  Location: [facility];  Service: Obstetrics;  Laterality: N/A;   Family History  Problem Relation Age of Onset  . Diabetes Mother   . Diabetes Maternal Grandmother   . Diabetes Maternal Grandfather   . Diabetes Paternal Grandmother   . Stroke  Paternal Grandmother    History  Substance Use Topics  . Smoking status: Never Smoker   . Smokeless tobacco: Not on file  . Alcohol Use: No   OB History    Gravida Para Term Preterm AB TAB SAB Ectopic Multiple Living   0 2     Review of Systems 10 Systems reviewed and are negative for acute change except as noted in the HPI.    Allergies  Lemon juice; Orange juice; Citrus; Penicillins; Sulfa antibiotics; Amoxicillin; and Latex  Home Medications   Prior to Admission medications   Medication Sig Start Date End Date Taking? Authorizing Provider  calcium carbonate (TUMS - DOSED IN MG ELEMENTAL CALCIUM) 500 MG chewable tablet Chew 2 tablets by mouth 3 (three) times daily as needed for indigestion or heartburn.   Yes Historical Provider, MD  oxyCODONE-acetaminophen (PERCOCET/ROXICET) 5-325 MG per tablet Take 1 tablet by mouth every 4 (four) hours as needed (for pain scale less than 7). 07/24/14  Yes Loney Laurence, MD  Prenatal Vit-Fe Fumarate-FA (PRENATAL MULTIVITAMIN) TABS tablet Take 1 tablet by mouth daily at 12 noon.   Yes Historical Provider, MD  butalbital-acetaminophen-caffeine (FIORICET) 50-325-40 MG per tablet Take 1-2 tablets by mouth every 6 (six) hours as needed for headache. Patient not taking: Reported on 07/26/2014 06/11/14   Dorathy Kinsman, CNM  Elastic Bandages & Supports (MEDICAL COMPRESSION STOCKINGS)  MISC 1 Device by Does not apply route daily. Patient not taking: Reported on 07/26/2014 06/11/14   Dorathy KinsmanVirginia Smith, CNM  famotidine (PEPCID) 20 MG tablet Take 1 tablet (20 mg total) by mouth 2 (two) times daily. Patient not taking: Reported on 07/19/2014 06/11/14 06/11/15  Dorathy KinsmanVirginia Smith, CNM   BP 158/99 mmHg  Pulse 116  Temp(Src) 98.5 F (36.9 C) (Oral)  Resp 22  Ht 5\' 8"  (1.727 m)  Wt 347 lb 1.6 oz (157.444 kg)  BMI 52.79 kg/m2  SpO2 90%  LMP 10/24/2013 Physical Exam  Constitutional: She is oriented to person, place, and time.  The patient is  morbidly obese but well-nourished. She is not in acute respiratory distress. The patient's skin is warm and dry and her mental status is normal.  HENT:  Head: Normocephalic and atraumatic.  Eyes: EOM are normal. Pupils are equal, round, and reactive to light.  Neck: Neck supple.  Cardiovascular: Regular rhythm, normal heart sounds and intact distal pulses.   Tachycardia  Pulmonary/Chest: Effort normal and breath sounds normal. No respiratory distress. She has no wheezes. She has no rales.  Abdominal: Soft. Bowel sounds are normal. She exhibits no distension. There is no tenderness.  The patient has a C-section incision which is distressed at this point in time. There is no surrounding erythema. The patient does not have significant pain to palpation around the wound nor in the abdomen.  Musculoskeletal: Normal range of motion. She exhibits no edema.  Neurological: She is alert and oriented to person, place, and time. She has normal strength. Coordination normal. GCS eye subscore is 4. GCS verbal subscore is 5. GCS motor subscore is 6.  Skin: Skin is warm, dry and intact.  Psychiatric: She has a normal mood and affect.    ED Course  Procedures (including critical care time) Labs Review Labs Reviewed  COMPREHENSIVE METABOLIC PANEL - Abnormal; Notable for the following:    Potassium 3.0 (*)    Glucose, Bld 116 (*)    Albumin 3.0 (*)    All other components within normal limits  BRAIN NATRIURETIC PEPTIDE - Abnormal; Notable for the following:    B Natriuretic Peptide 135.6 (*)    All other components within normal limits  CBC WITH DIFFERENTIAL/PLATELET - Abnormal; Notable for the following:    WBC 11.3 (*)    Hemoglobin 9.6 (*)    HCT 31.0 (*)    MCV 73.8 (*)    MCH 22.9 (*)    Neutro Abs 8.7 (*)    All other components within normal limits  URINALYSIS, ROUTINE W REFLEX MICROSCOPIC - Abnormal; Notable for the following:    Hgb urine dipstick MODERATE (*)    Leukocytes, UA SMALL (*)     All other components within normal limits  TROPONIN I - Abnormal; Notable for the following:    Troponin I 0.04 (*)    All other components within normal limits  TROPONIN I - Abnormal; Notable for the following:    Troponin I 0.04 (*)    All other components within normal limits  COMPREHENSIVE METABOLIC PANEL - Abnormal; Notable for the following:    Potassium 3.2 (*)    BUN 5 (*)    Albumin 2.9 (*)    All other components within normal limits  CBC WITH DIFFERENTIAL/PLATELET - Abnormal; Notable for the following:    WBC 11.3 (*)    Hemoglobin 9.2 (*)    HCT 29.9 (*)    MCV 73.3 (*)    [facility]  22.5 (*)    Neutro Abs 8.6 (*)    All other components within normal limits  COMPREHENSIVE METABOLIC PANEL - Abnormal; Notable for the following:    Potassium 3.1 (*)    Glucose, Bld 110 (*)    BUN <5 (*)    Creatinine, Ser 0.45 (*)    Albumin 2.7 (*)    All other components within normal limits  CBC - Abnormal; Notable for the following:    WBC 11.7 (*)    Hemoglobin 9.1 (*)    HCT 28.2 (*)    MCV 72.9 (*)    MCH 23.5 (*)    All other components within normal limits  CULTURE, BLOOD (ROUTINE X 2)  CULTURE, BLOOD (ROUTINE X 2)  URINE CULTURE  CULTURE, EXPECTORATED SPUTUM-ASSESSMENT  GRAM STAIN  LIPASE, BLOOD  TROPONIN I  URINE MICROSCOPIC-ADD ON  APTT  PROTIME-INR  STREP PNEUMONIAE URINARY ANTIGEN  TSH  MAGNESIUM  PHOSPHORUS  BLOOD GAS, ARTERIAL  HIV ANTIBODY (ROUTINE TESTING)  LEGIONELLA ANTIGEN, URINE  INFLUENZA PANEL BY PCR (TYPE A & B, H1N1)  TROPONIN I  CBC  I-STAT CG4 LACTIC ACID, ED  I-STAT CG4 LACTIC ACID, ED  TYPE AND SCREEN  ABO/RH    Imaging Review Ct Angio Chest Pe W/cm &/or Wo Cm  07/26/2014   CLINICAL DATA:  Substernal chest pain with fevers and dysuria.  EXAM: CT ANGIOGRAPHY CHEST WITH CONTRAST  TECHNIQUE: Multidetector CT imaging of the chest was performed using the standard protocol during bolus administration of intravenous contrast. Multiplanar CT  image reconstructions and MIPs were obtained to evaluate the vascular anatomy.  CONTRAST:  Omnipaque 350, dose currently unavailable.  COMPARISON:  None.  FINDINGS: THORACIC INLET/BODY WALL:  No acute abnormality.  MEDIASTINUM:  Normal heart size. No pericardial effusion. No acute vascular abnormality, including pulmonary embolism or aortic dissection. Note that sensitivity of CTA is limited by patient size. The thymus is prominent without definitive hyperplasia and no focal nodular area.  LUNG WINDOWS:  Dense airspace disease throughout the right lung consistent with pneumonia. There is early endobronchial spread of infection to the left lower lobe. Small, layering parapneumonic effusion present on the right. No cavitation or air leak.  UPPER ABDOMEN:  No acute findings.  OSSEOUS:  No acute fracture.  No suspicious lytic or blastic lesions.  Review of the MIP images confirms the above findings.  IMPRESSION: 1. Extensive right-sided pneumonia with small parapneumonic effusion. 2. CTA is limited by patient size. No evidence of pulmonary embolism.   Electronically Signed   By: Marnee Spring M.D.   On: 07/26/2014 14:41   Dg Chest Port 1 View  07/26/2014   CLINICAL DATA:  Peripneumonic effusion.  EXAM: PORTABLE CHEST - 1 VIEW  COMPARISON:  Chest radiograph of January 12, 2013. CT scan of July 26, 2014.  FINDINGS: Stable cardiomediastinal silhouette. No pneumothorax is noted. Left lung is clear. New airspace opacity is noted in right lower lobe consistent with pneumonia. No significant pleural effusion is noted. Bony thorax is intact.  IMPRESSION: Right lower lobe pneumonia. Followup radiographs are recommended until resolution.   Electronically Signed   By: Lupita Raider, M.D.   On: 07/26/2014 17:51     EKG Interpretation None     CRITICAL CARE Performed by: Arby Barrette   Total critical care time: 45  Critical care time was exclusive of separately billable procedures and treating other  patients.  Critical care was necessary to treat or prevent imminent or life-threatening deterioration.  Critical care was time spent personally by me on the following activities: development of treatment plan with patient and/or surrogate as well as nursing, discussions with consultants, evaluation of patient's response to treatment, examination of patient, obtaining history from patient or surrogate, ordering and performing treatments and interventions, ordering and review of laboratory studies, ordering and review of radiographic studies, pulse oximetry and re-evaluation of patient's condition. MDM   Final diagnoses:  Healthcare-associated pneumonia  Sepsis, due to unspecified organism   The patient has been ruled out for pulmonary embolus. CT scan identifies extensive right-sided pneumonia. The patient presents with hypoxia on room air and tachycardia. At this time findings are consistent with sepsis. The patient is alert with clear mental status and in no acute respiratory distress. Treatment will be for healthcare associated pneumonia as she is 5 days postpartum from C-section. By history and exam there does not appear to be acute wound infection. The patient is not complaining of lower abdominal pain or vaginal discharge or bleeding.    Arby Barrette, MD 07/27/14 [phone]

## 2014-07-26 NOTE — ED Notes (Signed)
Patient presents today with a chief complaint of substernal chest pain, fevers, and dysuria x 2 days. Patient had recent c-section on 07/21/14 and reports no problems with incision site.

## 2014-07-26 NOTE — Progress Notes (Signed)
ANTIBIOTIC CONSULT NOTE - INITIAL  Pharmacy Consult for Vancomycin  Indication: pneumonia  Allergies  Allergen Reactions  . Lemon Juice Itching and Other (See Comments)    Tongue swelling  . Orange Juice Itching and Other (See Comments)    Tongue swelling  . Citrus Itching and Swelling    Tongue swelling  . Penicillins Itching  . Sulfa Antibiotics Itching  . Amoxicillin Rash  . Latex Itching and Rash    Patient Measurements: Height : 68 inches Weight : 165.6 kg  Vital Signs: Temp: 98.7 F (37.1 C) (02/09 1322) Temp Source: Oral (02/09 1322) BP: 155/85 mmHg (02/09 1520) Pulse Rate: 126 (02/09 1520) Intake/Output from previous day:   Intake/Output from this shift:    Labs:  Recent Labs  07/26/14 1120  WBC 11.3*  HGB 9.6*  PLT 359  CREATININE 0.63   Estimated Creatinine Clearance: 176 mL/min (by C-G formula based on Cr of 0.63). No results for input(s): VANCOTROUGH, VANCOPEAK, VANCORANDOM, GENTTROUGH, GENTPEAK, GENTRANDOM, TOBRATROUGH, TOBRAPEAK, TOBRARND, AMIKACINPEAK, AMIKACINTROU, AMIKACIN in the last 72 hours.   Microbiology: Recent Results (from the past 720 hour(s))  OB RESULT CONSOLE Group B Strep     Status: None   Collection Time: 06/30/14 12:00 AM  Result Value Ref Range Status   GBS Positive  Final    Medical History: Past Medical History  Diagnosis Date  . Ovarian cyst     left side  . Hypertension   . Obesity   . Pregnant   . Pregnancy induced hypertension     Medications:  Scheduled:   Infusions:  . sodium chloride    . aztreonam    . sodium chloride    . vancomycin     Assessment:  [age] year female s/p C-section 5 days ago.  Presents with chest pain, shortness of breath x 2 days, fever  CTAngio = no PE  CTAngio = + right sided PNA  Blood cultures x 2 ordered  Aztreonam 2gm x 1 ordered in ED (given @ 15:28)  Pharmacy consulted to dose Vancomycin for pneumonia  Weight = 165.6 kg however due to recent childbirth, would  anticipate weight decreasing over course of antibiotics - will closely watch  CrCl > 120 ml/min (CG & normalized)  Goal of Therapy:  Vancomycin trough level 15-20 mcg/ml  Plan:  Measure antibiotic drug levels at steady state Follow up culture results   Vancomycin 2500mg  IV x 1 then 1250mg  IV q8h  Closely follow weight (initial dose chosen based off anticipated declining weight due to recent childbirth)  F/U continuation of  Aztreonam upon admission  Abir Eroh, Joselyn GlassmanLeann Trefz, PharmD 07/26/2014,3:27 PM

## 2014-07-26 NOTE — Procedures (Signed)
Pt refuses ABG at this time.  Pt on room air SpO2-95, HR-134, RR-24.

## 2014-07-26 NOTE — Progress Notes (Signed)
BP rechecked after receiving prn Apresoline 5mg . BP 159/103. Troponin level also mildly elevated. NP on call made aware of both. Prn order for apresoline received and given to patient.  At this time, patient c/o chest pain that she states feels "heavy" and similar to the same pain she was experiencing when she came into the ED today. Patient refusing PO Tylenol and IV Morphine for pain control and said she was "ok for now." EKG performed during shift change.  Night RN to reassess elevated BP.

## 2014-07-26 NOTE — H&P (Addendum)
[facility] History and Physical  Kimberly [name] RMB:[id number] DOB: 1990-06-28 DOA: 07/26/2014  Referring physician: ED physician PCP: No PCP Per Patient   Chief Complaint: shortness of breath   HPI:  24 year old female with recent C- section 07/21/2014, has had gestational hypertension who presented to [facility] ED with copliantsof worsening shortness of breath for past few days prior to this admission with associated with mid sternal chest pain, 5--/10 in intensity, non radiating and worse with breathing. She also has associated dry cough. Shortness of breath was worse with exertion but was present at rest as well. Patient also endorsed subjective fevers and chills. No lightheadedness, no loss of consciousness. No bleeding from C.section site. No diarrhea or constipation. No GU complaints. In ED, BP was 155/85, HR 118 - 126, RR 22-24, afebrile and oxygen saturation 93% with [place] oxygen support. CT angio chest ruled out pulmonary embolism but showed extensive right sided pneumonia with small parapneumonic effusion. She was given aztreonam and vanco due to PCN allergy. She was admitted for further evaluation and management of possible HCAP.  Assessment and Plan:  Principal Problem: Acute respiratory failure with hypoxia / Parapneumonic effusion / HCAP - oxygen saturation 93% but this is with [place] oxygen support. She was recently hospitalized so will treat with broad spectrum antibiotics, aztreonam and vanco due to PCN allergy - CT angio chest ruled out pulmonary embolism but showed extensive right sided pneumonia with small parapneumonic effusion. Will obtain admission CXR. - pneumonia order set in place - follow up blood, resp cultures, influenza, strep pneumonia and legionella results - oxygen support via [place] to keep O2 sats above 90% - may use duoneb every 4 hours as needed for shortness of breath or wheezing   Active Problems: Sepsis secondary to  HCAP / Leukocytosis - sepsis criteria met on  admission with hypoxia, tachycardia, tachypnea, fever of 100.6 F, leukocytosis and source of infection pneumonia identified on CT angio chest - started vanco and aztreonam - pneumonia order set in place  Chest pain - probably related to parapneumonia effusion but will make sure to rule out ACS - cycle cardiac enzymes - the 12 lead EKG showed sinus tachycardia  - follow up 2 D ECHO  Postoperative anemia - likely from recent C- section - hemoglobin 9.6 - follow up CBC In am   HTN (hypertension), essential - has had gestation hypertension  - start low dose Norvasc - add hydralazine prn for better BP control   Hypokalemia - unclear etiology, possibly from sepsis - supplemented - follow up BMP in am  C- section 07/21/2014 - continue prenatal vitamins   Morbid obesity - nutrition consulted  - Body mass index is 52.3 kg/(m^2).  DVT prophylaxis: - Lovenox subQ ordered  Radiological Exams on Admission: Ct Angio Chest Pe W/cm &/or Wo Cm 07/26/2014 1. Extensive right-sided pneumonia with small parapneumonic effusion. 2. CTA is limited by patient size. No evidence of pulmonary embolism.      Code Status: Full Family Communication: Pt at bedside Disposition Plan: Admit for further evaluation; telemetry     [place] [phone]   Review of Systems:  Constitutional: Negative for diaphoresis.  HENT: Negative for hearing loss, ear pain, neck pain, tinnitus and ear discharge.   Eyes: Negative for blurred vision, double vision, photophobia, pain, discharge and redness.  Respiratory: per HPI.   Cardiovascular: Negative for chest pain, palpitations, orthopnea, claudication and leg swelling.  Gastrointestinal: Negative for heartburn, constipation, blood in stool and melena.  Genitourinary: Negative for dysuria, urgency, frequency, hematuria and flank pain.  Musculoskeletal: Negative for myalgias, back pain, joint pain and falls.  Skin: Negative for itching and rash.   Neurological: Negative for dizziness and weakness.  Endo/Heme/Allergies: Negative for environmental allergies and polydipsia. Does not bruise/bleed easily.  Psychiatric/Behavioral: Negative for suicidal ideas. The patient is not nervous/anxious.      Past Medical History  Diagnosis Date  . Ovarian cyst     left side  . Hypertension   . Obesity   . Pregnant   . Pregnancy induced hypertension     Past Surgical History  Procedure Laterality Date  . Tonsillectomy    . Wisdom tooth extraction    . Dilation and curettage of uterus    . Cesarean section  04/09/2011    Procedure: CESAREAN SECTION;  Surgeon: Daria Pastures;  Location: [facility];  Service: Gynecology;  Laterality: N/A;  . Knee arthroscopy    . Cesarean section N/A 07/21/2014    Procedure: CESAREAN SECTION;  Surgeon: Farrel Gobble. Harrington Challenger, MD;  Location: [facility];  Service: Obstetrics;  Laterality: N/A;    Social History:  reports that she has never smoked. She does not have any smokeless tobacco history on file. She reports that she does not drink alcohol or use illicit drugs.  Allergies  Allergen Reactions  . Lemon Juice Itching and Other (See Comments)    Tongue swelling  . Orange Juice Itching and Other (See Comments)    Tongue swelling  . Citrus Itching and Swelling    Tongue swelling  . Penicillins Itching  . Sulfa Antibiotics Itching  . Amoxicillin Rash  . Latex Itching and Rash    Family History  Problem Relation Age of Onset  . Diabetes Mother   . Diabetes Maternal Grandmother   . Diabetes Maternal Grandfather   . Diabetes Paternal Grandmother   . Stroke Paternal Grandmother     Prior to Admission medications   Medication Sig Start Date End Date Taking? Authorizing Provider  calcium carbonate (TUMS - DOSED IN MG ELEMENTAL CALCIUM) 500 MG chewable tablet Chew 2 tablets by mouth 3 (three) times daily as needed for indigestion or heartburn.   Yes Historical Provider, MD  oxyCODONE-acetaminophen  (PERCOCET/ROXICET) 5-325 MG per tablet Take 1 tablet by mouth every 4 (four) hours as needed (for pain scale less than 7). 07/24/14  Yes Daria Pastures, MD  Prenatal Vit-Fe Fumarate-FA (PRENATAL MULTIVITAMIN) TABS tablet Take 1 tablet by mouth daily at 12 noon.   Yes Historical Provider, MD  butalbital-acetaminophen-caffeine (FIORICET) 50-325-40 MG per tablet Take 1-2 tablets by mouth every 6 (six) hours as needed for headache. Patient not taking: Reported on 07/26/2014 06/11/14   Manya Silvas, CNM  Elastic Bandages & Supports (MEDICAL COMPRESSION STOCKINGS) MISC 1 Device by Does not apply route daily. Patient not taking: Reported on 07/26/2014 06/11/14   Manya Silvas, CNM  famotidine (PEPCID) 20 MG tablet Take 1 tablet (20 mg total) by mouth 2 (two) times daily. Patient not taking: Reported on 07/19/2014 06/11/14 06/11/15  Manya Silvas, CNM    Physical Exam: Filed Vitals:   07/26/14 1108 07/26/14 1322 07/26/14 1520  BP: 160/92 177/101 155/85  Pulse:  118 126  Temp: 98.3 F (36.8 C) 98.7 F (37.1 C)   TempSrc: Oral Oral   Resp: '22 24 24  ' SpO2: 96% 95% 93%    Physical Exam  Constitutional: Appears well-developed and well-nourished. No distress. Morbidly obese  HENT: Normocephalic. External right and left ear  normal. Oropharynx is clear and moist.  Eyes: Conjunctivae and EOM are normal. PERRLA, no scleral icterus.  Neck: Normal ROM. Neck supple. No JVD. No tracheal deviation. No thyromegaly.  CVS: Regular rhythm, tachycardic, S1/S2 +, no murmurs, no gallops, no carotid bruit.  Pulmonary: diminished breath sounds on right, no wheezing.  Abdominal: Soft. BS +,  no distension, tenderness, rebound or guarding.  Musculoskeletal: Normal range of motion. No edema and no tenderness.  Lymphadenopathy: No lymphadenopathy noted, cervical, inguinal. Neuro: Alert. Normal reflexes, muscle tone coordination. No cranial nerve deficit. Skin: Skin is warm and dry. No rash noted. Not diaphoretic. No  erythema. No pallor.  Psychiatric: Normal mood and affect. Behavior, judgment, thought content normal.   Labs on Admission:  Basic Metabolic Panel:  Recent Labs Lab 07/19/14 1625 07/26/14 1120  NA 134* 139  K 3.5 3.0*  CL 105 105  CO2 23 24  GLUCOSE 87 116*  BUN 7 6  CREATININE 0.49* 0.63  CALCIUM 8.7 8.6   Liver Function Tests:  Recent Labs Lab 07/19/14 1625 07/26/14 1120  AST 23 31  ALT 15 22  ALKPHOS 163* 109  BILITOT 0.7 0.5  PROT 6.4 7.1  ALBUMIN 2.8* 3.0*    Recent Labs Lab 07/26/14 1120  LIPASE 15   CBC:  Recent Labs Lab 07/19/14 1625 07/20/14 1125 07/22/14 0630 07/26/14 1120  WBC 8.7 8.5 14.5* 11.3*  NEUTROABS  --   --   --  8.7*  HGB 10.4* 10.7* 8.5* 9.6*  HCT 32.7* 33.9* 26.0* 31.0*  MCV 73.6* 73.5* 72.8* 73.8*  PLT 257 254 212 359   Cardiac Enzymes:  Recent Labs Lab 07/26/14 1120  TROPONINI 0.03    EKG: sinus tachycardia    If 7PM-7AM, please contact night-coverage [email or web address] Password [facility] 07/26/2014, 3:49 PM

## 2014-07-26 NOTE — Procedures (Signed)
Sputum cup given to pt . 

## 2014-07-26 NOTE — ED Notes (Signed)
Pt. Is unable to use the restroom at this time, but is aware that we need a urine specimen.  

## 2014-07-27 DIAGNOSIS — E876 Hypokalemia: Secondary | ICD-10-CM

## 2014-07-27 DIAGNOSIS — J189 Pneumonia, unspecified organism: Secondary | ICD-10-CM

## 2014-07-27 LAB — COMPREHENSIVE METABOLIC PANEL
ALT: 18 U/L (ref 0–35)
AST: 23 U/L (ref 0–37)
Albumin: 2.7 g/dL — ABNORMAL LOW (ref 3.5–5.2)
Alkaline Phosphatase: 94 U/L (ref 39–117)
Anion gap: 11 (ref 5–15)
BILIRUBIN TOTAL: 0.8 mg/dL (ref 0.3–1.2)
CALCIUM: 8.4 mg/dL (ref 8.4–10.5)
CO2: 23 mmol/L (ref 19–32)
CREATININE: 0.45 mg/dL — AB (ref 0.50–1.10)
Chloride: 106 mmol/L (ref 96–112)
GFR calc Af Amer: >90 mL/min (ref >90)
GLUCOSE: 110 mg/dL — AB (ref 70–99)
Potassium: 3.1 mmol/L — ABNORMAL LOW (ref 3.5–5.1)
Sodium: 140 mmol/L (ref 135–145)
TOTAL PROTEIN: 6.5 g/dL (ref 6.0–8.3)

## 2014-07-27 LAB — CBC
HCT: 28.2 % — ABNORMAL LOW (ref 36.0–46.0)
HEMOGLOBIN: 9.1 g/dL — AB (ref 12.0–15.0)
MCH: 23.5 pg — AB (ref 26.0–34.0)
MCHC: 32.3 g/dL (ref 30.0–36.0)
MCV: 72.9 fL — ABNORMAL LOW (ref 78.0–100.0)
Platelets: 330 10*3/uL (ref 150–400)
RBC: 3.87 MIL/uL (ref 3.87–5.11)
RDW: 14.6 % (ref 11.5–15.5)
WBC: 11.7 10*3/uL — ABNORMAL HIGH (ref 4.0–10.5)

## 2014-07-27 LAB — INFLUENZA PANEL BY PCR (TYPE A & B)
H1N1 flu by pcr: NOT DETECTED
INFLAPCR: NEGATIVE
INFLBPCR: NEGATIVE

## 2014-07-27 LAB — TROPONIN I

## 2014-07-27 LAB — LEGIONELLA ANTIGEN, URINE

## 2014-07-27 LAB — VANCOMYCIN, TROUGH: Vancomycin Tr: 12.1 ug/mL (ref 10.0–20.0)

## 2014-07-27 LAB — ABO/RH: ABO/RH(D): A POS

## 2014-07-27 LAB — GLUCOSE, CAPILLARY: Glucose-Capillary: 104 mg/dL — ABNORMAL HIGH (ref 70–99)

## 2014-07-27 LAB — STREP PNEUMONIAE URINARY ANTIGEN: STREP PNEUMO URINARY ANTIGEN: NEGATIVE

## 2014-07-27 MED ORDER — AMLODIPINE BESYLATE 10 MG PO TABS
10.0000 mg | ORAL_TABLET | Freq: Every day | ORAL | Status: DC
  Administered 2014-07-28: 10 mg via ORAL
  Filled 2014-07-27 (×2): qty 1

## 2014-07-27 MED ORDER — LABETALOL HCL 5 MG/ML IV SOLN
10.0000 mg | Freq: Once | INTRAVENOUS | Status: AC
  Administered 2014-07-27: 10 mg via INTRAVENOUS
  Filled 2014-07-27: qty 4

## 2014-07-27 MED ORDER — HYDRALAZINE HCL 20 MG/ML IJ SOLN: 10.0000 mg | Freq: Four times a day (QID) | INTRAMUSCULAR | Status: DC | PRN

## 2014-07-27 MED ORDER — POTASSIUM CHLORIDE CRYS ER 20 MEQ PO TBCR
40.0000 meq | EXTENDED_RELEASE_TABLET | ORAL | Status: AC
  Administered 2014-07-27 (×2): 40 meq via ORAL
  Filled 2014-07-27 (×2): qty 2

## 2014-07-27 MED ORDER — VANCOMYCIN HCL 10 G IV SOLR
1500.0000 mg | Freq: Three times a day (TID) | INTRAVENOUS | Status: DC
  Administered 2014-07-28 (×2): 1500 mg via INTRAVENOUS
  Filled 2014-07-27 (×3): qty 1500

## 2014-07-27 MED ORDER — DOCUSATE SODIUM 100 MG PO CAPS
100.0000 mg | ORAL_CAPSULE | Freq: Two times a day (BID) | ORAL | Status: DC
  Administered 2014-07-27 – 2014-07-29 (×5): 100 mg via ORAL
  Filled 2014-07-27 (×6): qty 1

## 2014-07-27 NOTE — Progress Notes (Signed)
ANTIBIOTIC CONSULT NOTE - Follow up  Pharmacy Consult for Vancomycin  Indication: pneumonia  Allergies  Allergen Reactions  . Lemon Juice Itching and Other (See Comments)    Tongue swelling  . Orange Juice Itching and Other (See Comments)    Tongue swelling  . Citrus Itching and Swelling    Tongue swelling  . Penicillins Itching  . Sulfa Antibiotics Itching  . Amoxicillin Rash  . Latex Itching and Rash    Patient Measurements: Height : 68 inches Weight : 165.6 kg  Vital Signs: Temp: 98.5 F (36.9 C) (02/10 0421) Temp Source: Oral (02/10 0421) BP: 158/99 mmHg (02/10 0421) Pulse Rate: 116 (02/10 0421) Intake/Output from previous day: 02/09 0701 - 02/10 0700 In: 593.8 [I.V.:93.8; IV Piggyback:500] Out: 2750 [Urine:2750] Intake/Output from this shift:    Labs:  Recent Labs  07/26/14 1120 07/26/14 1755 07/27/14 0500  WBC 11.3* 11.3* 11.7*  HGB 9.6* 9.2* 9.1*  PLT 359 389 330  CREATININE 0.63 0.54 0.45*   Estimated Creatinine Clearance: 170.4 mL/min (by C-G formula based on Cr of 0.45). No results for input(s): VANCOTROUGH, VANCOPEAK, VANCORANDOM, GENTTROUGH, GENTPEAK, GENTRANDOM, TOBRATROUGH, TOBRAPEAK, TOBRARND, AMIKACINPEAK, AMIKACINTROU, AMIKACIN in the last 72 hours.   Microbiology: Recent Results (from the past 720 hour(s))  OB RESULT CONSOLE Group B Strep     Status: None   Collection Time: 06/30/14 12:00 AM  Result Value Ref Range Status   GBS Positive  Final  Culture, blood (routine x 2)     Status: None (Preliminary result)   Collection Time: 07/26/14 11:14 AM  Result Value Ref Range Status   Specimen Description BLOOD RIGHT ANTECUBITAL  Final   Special Requests BOTTLES DRAWN AEROBIC AND ANAEROBIC 5ML  Final   Culture   Final           BLOOD CULTURE RECEIVED NO GROWTH TO DATE CULTURE WILL BE HELD FOR 5 DAYS BEFORE ISSUING A FINAL NEGATIVE REPORT Performed [facility]    Report Status PENDING  Incomplete  Culture, blood (routine x 2)      Status: None (Preliminary result)   Collection Time: 07/26/14 11:14 AM  Result Value Ref Range Status   Specimen Description BLOOD LEFT HAND  Final   Special Requests BOTTLES DRAWN AEROBIC AND ANAEROBIC 3ML  Final   Culture   Final           BLOOD CULTURE RECEIVED NO GROWTH TO DATE CULTURE WILL BE HELD FOR 5 DAYS BEFORE ISSUING A FINAL NEGATIVE REPORT Performed at [facility]    Report Status PENDING  Incomplete    Medical History: Past Medical History  Diagnosis Date  . Ovarian cyst     left side  . Hypertension   . Obesity   . Pregnant   . Pregnancy induced hypertension     Medications:  Scheduled:  . amLODipine  5 mg Oral Daily  . aztreonam  2 g Intravenous Q8H  . enoxaparin (LOVENOX) injection  40 mg Subcutaneous QHS  . prenatal multivitamin  1 tablet Oral Q1200  . sodium chloride  3 mL Intravenous Q12H  . vancomycin  1,250 mg Intravenous Q8H   Infusions:    Assessment:  [age] year female s/p C-section 6 days ago.  Presents with chest pain, shortness of breath x 2 days, fever  CTAngio = no PE  CTAngio = + right sided PNA  Blood cultures: NGTD  Aztreonam 2gm IV q8h per MD (pharmacy may adjust per renal fxn)  Pharmacy consulted  to dose Vancomycin for pneumonia  Weight = 165.6 kg however due to recent childbirth, would anticipate weight decreasing over course of antibiotics - will closely watch  CrCl > 120 ml/min (CG & normalized)  Goal of Therapy:  Vancomycin trough level 15-20 mcg/ml  Plan:  Measure antibiotic drug levels at steady state Follow up culture results   Continue Vancomycin   IV q8h  Obtain Vanc trough today at 1700 before the 4th dose  Follow weight/ renal function/clinical course    Arley Phenix RPh 07/27/2014, 12:05 PM Pager [phone]

## 2014-07-27 NOTE — Progress Notes (Signed)
PHARMACY BRIEF NOTE - Drug Level Result  Consult for:  Vancomycin Indication:  Pneumonia  The current Vancomycin dose is 1250 mg IV every 8 hours.  A trough level drawn at 17:24 today is reported as 12.1 mcg/ml.  This level is below the therapeutic range, 15-20 mcg/ml.  All doses have been given as scheduled.  Plan:    Increase the dose to 1500 mg every 8 hours, with the first dose given early to provide a loading dose effect.  Repeat the trough level as needed.  Polo Rileylaire Ronnae Kaser R.Ph. 07/27/2014 7:47 PM

## 2014-07-27 NOTE — Care Management Note (Addendum)
    Page 1 of 1   07/29/2014     2:55:06 PM CARE MANAGEMENT NOTE 07/29/2014  Patient:  Kimberly [name],Kimberly [name]   Account Number:  [id number]  Date Initiated:  07/27/2014  Documentation initiated by:  Lanier ClamMAHABIR,Reana Chacko  Subjective/Objective Assessment:   24 y/o f admitted w/PNA.[facility]     Action/Plan:   From home.   Anticipated DC Date:  07/29/2014   Anticipated DC Plan:  HOME/SELF CARE      DC Planning Services  CM consult      Choice offered to / List presented to:             Status of service:  Completed, signed off Medicare Important Message given?   (If response is "NO", the following Medicare IM given date fields will be blank) Date Medicare IM given:   Medicare IM given by:   Date Additional Medicare IM given:   Additional Medicare IM given by:    Discharge Disposition:  HOME/SELF CARE  Per UR Regulation:  Reviewed for med. necessity/level of care/duration of stay  If discussed at Long Length of Stay Meetings, dates discussed:    Comments:  07/29/14 Lanier ClamKathy Rashun Grattan RN BSN NCM [phone] Has medicaid & has pcp on insurance card, also has case worker to assist w/getting new pcp through medicaid process.Paitent voiced understanding. No further d/c needs.  07/27/14 Lanier ClamKathy Torence Palmeri RN BSN NCM [phone] No anticipated d/c needs.

## 2014-07-27 NOTE — Progress Notes (Signed)
Flu PCR negative. Droplet precautions d/c. Patient informed.

## 2014-07-27 NOTE — Progress Notes (Signed)
Blood tinged urine noted. Pt voided several times during the shift and urine has become pink tinged to blood tinged in color. Will continue to monitor. SRP, RN

## 2014-07-27 NOTE — Progress Notes (Signed)
  Echocardiogram 2D Echocardiogram has been performed.  Kimberly [name] 07/27/2014, 9:22 AM

## 2014-07-27 NOTE — Progress Notes (Signed)
[facility] PROGRESS NOTE  CONTINA STRAIN DGU:[id number] DOB: 01-27-1991 DOA: 07/26/2014  PCP: No PCP Per Patient  Brief HPI: 24 year old African-American female who had a C-section on 07-21-14 at [facility] She had gestational hypertension. She presented with worsening shortness of breath and was found to have right-sided pneumonia. She was subsequently admitted to the hospital.  Past medical history:  Past Medical History  Diagnosis Date  . Ovarian cyst     left side  . Hypertension   . Obesity   . Pregnant   . Pregnancy induced hypertension     Consultants: None  Procedures: None  Antibiotics: Vancomycin and aztreonam 2/9  Subjective: Patient feels better. Still little bit tachypneic at times. Denies any headaches. No chest pain. Concerned about her elevated blood pressure. No cough.  Objective: Vital Signs  Filed Vitals:   07/26/14 2013 07/26/14 2200 07/27/14 0158 07/27/14 0421  BP: 165/99 150/94 158/95 158/99  Pulse: 139 127 125 116  Temp: 100.8 F (38.2 C) 100.5 F (38.1 C) 98.4 F (36.9 C) 98.5 F (36.9 C)  TempSrc: Oral Oral Oral Oral  Resp: _0 Height:      Weight:    157.444 kg (347 lb 1.6 oz)  SpO2: 93%  93% 90%    Intake/Output Summary (Last 24 hours) at 07/27/14 1214 Last data filed at 07/27/14 0425  Gross per 24 hour  Intake 593.75 ml  Output   2750 ml  Net -2156.25 ml   Filed Weights   07/26/14 1717 07/27/14 0421  Weight: 155.992 kg (343 lb 14.4 oz) 157.444 kg (347 lb 1.6 oz)    General appearance: alert, cooperative, appears stated age and no distress. Morbidly obese. Resp: Crackles right base with a few rhonchi. Diminished air entry at right lung base Cardio: regular rate and rhythm, S1, S2 normal, no murmur, click, rub or gallop GI: soft, non-tender; bowel sounds normal; no masses,  no organomegaly Extremities: extremities normal, atraumatic, no cyanosis or edema Neurologic: Alert and oriented 3. No focal  neurological deficits are present.  Lab Results:  Basic Metabolic Panel:  Recent Labs Lab 07/26/14 1120 07/26/14 1755 07/27/14 0500  NA 139 141 140  K 3.0* 3.2* 3.1*  CL 105 104 106  CO2 _1 GLUCOSE 116* 97 110*  BUN 6 5* <5*  CREATININE 0.63 0.54 0.45*  CALCIUM 8.6 8.6 8.4  MG  --  1.8  --   PHOS  --  3.8  --    Liver Function Tests:  Recent Labs Lab 07/26/14 1120 07/26/14 1755 07/27/14 0500  AST _2 ALT _3 ALKPHOS 109 112 94  BILITOT 0.5 0.9 0.8  PROT 7.1 6.7 6.5  ALBUMIN 3.0* 2.9* 2.7*    Recent Labs Lab 07/26/14 1120  LIPASE 15   CBC:  Recent Labs Lab 07/22/14 0630 07/26/14 1120 07/26/14 1755 07/27/14 0500  WBC 14.5* 11.3* 11.3* 11.7*  NEUTROABS  --  8.7* 8.6*  --   HGB 8.5* 9.6* 9.2* 9.1*  HCT 26.0* 31.0* 29.9* 28.2*  MCV 72.8* 73.8* 73.3* 72.9*  PLT 212 359 389 330   Cardiac Enzymes:  Recent Labs Lab 07/26/14 1120 07/26/14 1755 07/26/14 2159  TROPONINI 0.03 0.04* 0.04*   BNP (last 3 results)  Recent Labs  07/26/14 1114  BNP 135.6*   CBG:  Recent Labs Lab 07/27/14 0742  GLUCAP 104*    Recent Results (from the past 240 hour(s))  Culture, blood (  routine x 2)     Status: None (Preliminary result)   Collection Time: 07/26/14 11:14 AM  Result Value Ref Range Status   Specimen Description BLOOD RIGHT ANTECUBITAL  Final   Special Requests BOTTLES DRAWN AEROBIC AND ANAEROBIC 5ML  Final   Culture   Final           BLOOD CULTURE RECEIVED NO GROWTH TO DATE CULTURE WILL BE HELD FOR 5 DAYS BEFORE ISSUING A FINAL NEGATIVE REPORT Performed at [facility]    Report Status PENDING  Incomplete  Culture, blood (routine x 2)     Status: None (Preliminary result)   Collection Time: 07/26/14 11:14 AM  Result Value Ref Range Status   Specimen Description BLOOD LEFT HAND  Final   Special Requests BOTTLES DRAWN AEROBIC AND ANAEROBIC 3ML  Final   Culture   Final           BLOOD CULTURE RECEIVED NO GROWTH TO DATE  CULTURE WILL BE HELD FOR 5 DAYS BEFORE ISSUING A FINAL NEGATIVE REPORT Performed at [facility]    Report Status PENDING  Incomplete      Studies/Results: Ct Angio Chest Pe W/cm &/or Wo Cm  07/26/2014   CLINICAL DATA:  Substernal chest pain with fevers and dysuria.  EXAM: CT ANGIOGRAPHY CHEST WITH CONTRAST  TECHNIQUE: Multidetector CT imaging of the chest was performed using the standard protocol during bolus administration of intravenous contrast. Multiplanar CT image reconstructions and MIPs were obtained to evaluate the vascular anatomy.  CONTRAST:  Omnipaque 350, dose currently unavailable.  COMPARISON:  None.  FINDINGS: THORACIC INLET/BODY WALL:  No acute abnormality.  MEDIASTINUM:  Normal heart size. No pericardial effusion. No acute vascular abnormality, including pulmonary embolism or aortic dissection. Note that sensitivity of CTA is limited by patient size. The thymus is prominent without definitive hyperplasia and no focal nodular area.  LUNG WINDOWS:  Dense airspace disease throughout the right lung consistent with pneumonia. There is early endobronchial spread of infection to the left lower lobe. Small, layering parapneumonic effusion present on the right. No cavitation or air leak.  UPPER ABDOMEN:  No acute findings.  OSSEOUS:  No acute fracture.  No suspicious lytic or blastic lesions.  Review of the MIP images confirms the above findings.  IMPRESSION: 1. Extensive right-sided pneumonia with small parapneumonic effusion. 2. CTA is limited by patient size. No evidence of pulmonary embolism.   Electronically Signed   By: Monte Fantasia M.D.   On: 07/26/2014 14:41   Dg Chest Port 1 View  07/26/2014   CLINICAL DATA:  Peripneumonic effusion.  EXAM: PORTABLE CHEST - 1 VIEW  COMPARISON:  Chest radiograph of January 12, 2013. CT scan of July 26, 2014.  FINDINGS: Stable cardiomediastinal silhouette. No pneumothorax is noted. Left lung is clear. New airspace opacity is noted in right lower  lobe consistent with pneumonia. No significant pleural effusion is noted. Bony thorax is intact.  IMPRESSION: Right lower lobe pneumonia. Followup radiographs are recommended until resolution.   Electronically Signed   By: Marijo Conception, M.D.   On: 07/26/2014 17:51    Medications:  Scheduled: . amLODipine  5 mg Oral Daily  . aztreonam  2 g Intravenous Q8H  . docusate sodium  100 mg Oral BID  . enoxaparin (LOVENOX) injection  40 mg Subcutaneous QHS  . labetalol  10 mg Intravenous Once  . potassium chloride  40 mEq Oral Q4H  . prenatal multivitamin  1 tablet Oral Q1200  .  sodium chloride  3 mL Intravenous Q12H  . vancomycin  1,250 mg Intravenous Q8H   Continuous:  ELM:[id number] **OR** acetaminophen, hydrALAZINE, ipratropium-albuterol, morphine injection, ondansetron **OR** ondansetron (ZOFRAN) IV  Assessment/Plan:  Principal Problem:   Parapneumonic effusion Active Problems:   HTN (hypertension)   Hypokalemia   Leukocytosis    Acute respiratory failure with hypoxia / Parapneumonic effusion / HCAP Saturations are improved. She is saturating mid 90s on room air. Overall, she feels better. Continue current treatment. Await culture data.  Sepsis secondary to HCAP / Leukocytosis Sepsis criteria met on admission with hypoxia, tachycardia, tachypnea, fever of 100.6 F, leukocytosis and source of infection pneumonia identified on CT angio chest. Continue vanco and aztreonam.   Chest pain Most likely due to the above. Troponins are insignificant. 2-D echocardiogram is pending  Postoperative anemia Hemoglobin is stable.  HTN (hypertension), pregnancy-induced Blood pressure remains high. Will increase the dose of amlodipine. Increase when necessary hydralazine as well.  Hypokalemia Replace  C- section 07/21/2014 Remains stable. Incision site looks clean. No drainage noted.  Morbid obesity Body mass index is 52.3 kg/(m^2).  DVT Prophylaxis: Lovenox    Code Status: Full  code  Family Communication: Discussed with patient  Disposition Plan: Not ready for discharge    LOS: 1 day   [facility] Pager [phone] 07/27/2014, 12:14 PM  If 7PM-7AM, please contact night-coverage at [email or web address], password [facility]

## 2014-07-27 NOTE — Plan of Care (Signed)
Problem: Food- and Nutrition-Related Knowledge Deficit (NB-1.1) Goal: Nutrition education Formal process to instruct or train a patient/client in a skill or to impart knowledge to help patients/clients voluntarily manage or modify food choices and eating behavior to maintain or improve health. Outcome: Completed/Met Date Met:  07/27/14 RD was consulted for nutrition education regarding obesity.  Pt with history of gestational HTN, and family hx of DM.  Husband at bedside.   Pt reported typically eating grits, eggs, and bacon for breakfast, nothing for lunch d/t work, and whatever husband makes for dinner.   DI provided "General, Healthful Nutrition" ADA handout to promote healthy eating choices. Discussed MyPlate, and the appropriate ratio of macronutrients. Explained the difference between starchy vegetables vs non-starchy. Discussed whole-grain options, and reducing added sugars within the diet.  Discussed how eating healthy can prevent the progression of chronic diseases. Teach back method used.  Expect fair compliance.  Body mass index is 52.3 kg/(m^2). Pt meets criteria for obesity class II based on current BMI.  Current diet order is regular. Labs and medications reviewed. No further nutrition interventions warranted at this time. DI contact information provided. If additional nutrition issues arise, please re-consult RD.  Wynona Dove, MS Dietetic Intern Pager: [phone]

## 2014-07-28 DIAGNOSIS — D62 Acute posthemorrhagic anemia: Secondary | ICD-10-CM

## 2014-07-28 LAB — IRON AND TIBC
Iron: 31 ug/dL — ABNORMAL LOW (ref 42–145)
SATURATION RATIOS: 9 % — AB (ref 20–55)
TIBC: 358 ug/dL (ref 250–470)
UIBC: 327 ug/dL (ref 125–400)

## 2014-07-28 LAB — URINE CULTURE

## 2014-07-28 LAB — RETICULOCYTES
RBC.: 4.02 MIL/uL (ref 3.87–5.11)
Retic Count, Absolute: 144.7 10*3/uL (ref 19.0–186.0)
Retic Ct Pct: 3.6 % — ABNORMAL HIGH (ref 0.4–3.1)

## 2014-07-28 LAB — HIV ANTIBODY (ROUTINE TESTING W REFLEX): HIV Screen 4th Generation wRfx: NONREACTIVE

## 2014-07-28 LAB — BASIC METABOLIC PANEL
Anion gap: 9 (ref 5–15)
BUN: 6 mg/dL (ref 6–23)
CALCIUM: 8.6 mg/dL (ref 8.4–10.5)
CO2: 23 mmol/L (ref 19–32)
Chloride: 109 mmol/L (ref 96–112)
Creatinine, Ser: 0.46 mg/dL — ABNORMAL LOW (ref 0.50–1.10)
GFR calc Af Amer: >90 mL/min (ref >90)
Glucose, Bld: 93 mg/dL (ref 70–99)
POTASSIUM: 3.7 mmol/L (ref 3.5–5.1)
SODIUM: 141 mmol/L (ref 135–145)

## 2014-07-28 LAB — CBC
HCT: 29.5 % — ABNORMAL LOW (ref 36.0–46.0)
HEMOGLOBIN: 9 g/dL — AB (ref 12.0–15.0)
MCH: 22.4 pg — ABNORMAL LOW (ref 26.0–34.0)
MCHC: 30.5 g/dL (ref 30.0–36.0)
MCV: 73.4 fL — ABNORMAL LOW (ref 78.0–100.0)
Platelets: 337 10*3/uL (ref 150–400)
RBC: 4.02 MIL/uL (ref 3.87–5.11)
RDW: 14.6 % (ref 11.5–15.5)
WBC: 10.8 10*3/uL — AB (ref 4.0–10.5)

## 2014-07-28 LAB — VITAMIN B12: Vitamin B-12: 445 pg/mL (ref 211–911)

## 2014-07-28 LAB — FERRITIN: Ferritin: 42 ng/mL (ref 10–291)

## 2014-07-28 LAB — GLUCOSE, CAPILLARY: GLUCOSE-CAPILLARY: 106 mg/dL — AB (ref 70–99)

## 2014-07-28 LAB — TROPONIN I: Troponin I: 0.06 ng/mL — ABNORMAL HIGH (ref <0.031)

## 2014-07-28 LAB — FOLATE: Folate: 8.2 ng/mL

## 2014-07-28 MED ORDER — METOPROLOL TARTRATE 25 MG PO TABS
25.0000 mg | ORAL_TABLET | Freq: Two times a day (BID) | ORAL | Status: DC
  Administered 2014-07-28 – 2014-07-29 (×3): 25 mg via ORAL
  Filled 2014-07-28 (×3): qty 1

## 2014-07-28 MED ORDER — LEVOFLOXACIN 750 MG PO TABS
750.0000 mg | ORAL_TABLET | Freq: Every day | ORAL | Status: DC
  Administered 2014-07-28: 750 mg via ORAL
  Filled 2014-07-28 (×3): qty 1

## 2014-07-28 NOTE — Progress Notes (Signed)
[facility] PROGRESS NOTE  SENIA EVEN KSH:[id number] DOB: 10/04/1990 DOA: 07/26/2014  PCP: No PCP Per Patient  Brief HPI: 24 year old African-American female who had a C-section on 07-21-14 at [facility]. She had gestational hypertension. She presented with worsening shortness of breath and was found to have right-sided pneumonia. She was subsequently admitted to the hospital.  Past medical history:  Past Medical History  Diagnosis Date  . Ovarian cyst     left side  . Hypertension   . Obesity   . Pregnant   . Pregnancy induced hypertension     Consultants: None  Procedures:  2D ECHO Study Conclusions - Left ventricle: The cavity size was normal. There was mildconcentric hypertrophy. Systolic function was normal. Theestimated ejection fraction was in the range of 60% to 65%. Wallmotion was normal; there were no regional wall motionabnormalities. Left ventricular diastolic function parameters were normal. - Aortic valve: Trileaflet; normal thickness leaflets. There was noregurgitation. - Aortic root: The aortic root was normal in size. - Mitral valve: Structurally normal valve. - Left atrium: The atrium was mildly dilated. - Right ventricle: Systolic function was normal. - Right atrium: The atrium was normal in size. - Tricuspid valve: There was mild regurgitation. - Pulmonic valve: Structurally normal valve. There was noregurgitation. - Pulmonary arteries: Systolic pressure was within the normalrange. - Inferior vena cava: The vessel was normal in size. - Pericardium, extracardiac: There was no pericardial effusion.  Antibiotics: Vancomycin and aztreonam 2/9-2/11 Levaquin 2/11  Subjective: Patient feels much better today. Denies any shortness of breath or cough. No chest pains. Mobilizing well. Less blood in the urine.   Objective: Vital Signs  Filed Vitals:   07/27/14 1850 07/27/14 2217 07/28/14 0211 07/28/14 0503  BP: 148/92 158/93 151/90  168/93  Pulse:  108 101 110  Temp:  98.8 F (37.1 C)  98.1 F (36.7 C)  TempSrc:  Oral  Oral  Resp:  21    Height:      Weight:    156.264 kg (344 lb 8 oz)  SpO2:  98%  98%    Intake/Output Summary (Last 24 hours) at 07/28/14 1018 Last data filed at 07/27/14 1810  Gross per 24 hour  Intake    600 ml  Output   1000 ml  Net   -400 ml   Filed Weights   07/26/14 1717 07/27/14 0421 07/28/14 0503  Weight: 155.992 kg (343 lb 14.4 oz) 157.444 kg (347 lb 1.6 oz) 156.264 kg (344 lb 8 oz)    General appearance: alert, cooperative, appears stated age and no distress. Morbidly obese. Resp: Improved air entry bilaterally. Few crackles at the right base.  Cardio: regular rate and rhythm, S1, S2 normal, no murmur, click, rub or gallop GI: soft, non-tender; bowel sounds normal; no masses,  no organomegaly. Incision site looks clean. Extremities: extremities normal, atraumatic, no cyanosis or edema Neurologic: Alert and oriented 3. No focal neurological deficits are present.  Lab Results:  Basic Metabolic Panel:  Recent Labs Lab 07/26/14 1120 07/26/14 1755 07/27/14 0500 07/28/14 0429  NA 139 141 140 141  K 3.0* 3.2* 3.1* 3.7  CL 105 104 106 109  CO2 '24 26 23 23  ' GLUCOSE 116* 97 110* 93  BUN 6 5* <5* 6  CREATININE 0.63 0.54 0.45* 0.46*  CALCIUM 8.6 8.6 8.4 8.6  MG  --  1.8  --   --   PHOS  --  3.8  --   --    Liver Function  Tests:  Recent Labs Lab 07/26/14 1120 07/26/14 1755 07/27/14 0500  AST '31 30 23  ' ALT '22 21 18  ' ALKPHOS 109 112 94  BILITOT 0.5 0.9 0.8  PROT 7.1 6.7 6.5  ALBUMIN 3.0* 2.9* 2.7*    Recent Labs Lab 07/26/14 1120  LIPASE 15   CBC:  Recent Labs Lab 07/22/14 0630 07/26/14 1120 07/26/14 1755 07/27/14 0500 07/28/14 0429  WBC 14.5* 11.3* 11.3* 11.7* 10.8*  NEUTROABS  --  8.7* 8.6*  --   --   HGB 8.5* 9.6* 9.2* 9.1* 9.0*  HCT 26.0* 31.0* 29.9* 28.2* 29.5*  MCV 72.8* 73.8* 73.3* 72.9* 73.4*  PLT 212 359 389 330 337   Cardiac  Enzymes:  Recent Labs Lab 07/26/14 1120 07/26/14 1755 07/26/14 2159 07/27/14 1112 07/28/14 0429  TROPONINI 0.03 0.04* 0.04* <0.03 0.06*   BNP (last 3 results)  Recent Labs  07/26/14 1114  BNP 135.6*   CBG:  Recent Labs Lab 07/27/14 0742 07/28/14 0734  GLUCAP 104* 106*    Recent Results (from the past 240 hour(s))  Culture, blood (routine x 2)     Status: None (Preliminary result)   Collection Time: 07/26/14 11:14 AM  Result Value Ref Range Status   Specimen Description BLOOD RIGHT ANTECUBITAL  Final   Special Requests BOTTLES DRAWN AEROBIC AND ANAEROBIC 5ML  Final   Culture   Final           BLOOD CULTURE RECEIVED NO GROWTH TO DATE CULTURE WILL BE HELD FOR 5 DAYS BEFORE ISSUING A FINAL NEGATIVE REPORT Performed at [facility]    Report Status PENDING  Incomplete  Culture, blood (routine x 2)     Status: None (Preliminary result)   Collection Time: 07/26/14 11:14 AM  Result Value Ref Range Status   Specimen Description BLOOD LEFT HAND  Final   Special Requests BOTTLES DRAWN AEROBIC AND ANAEROBIC 3ML  Final   Culture   Final           BLOOD CULTURE RECEIVED NO GROWTH TO DATE CULTURE WILL BE HELD FOR 5 DAYS BEFORE ISSUING A FINAL NEGATIVE REPORT Performed at [facility]    Report Status PENDING  Incomplete  Urine culture     Status: None   Collection Time: 07/26/14  3:25 PM  Result Value Ref Range Status   Specimen Description URINE, RANDOM  Final   Special Requests NONE  Final   Colony Count   Final    30,000 COLONIES/ML Performed at [facility]    Culture   Final    Multiple bacterial morphotypes present, none predominant. Suggest appropriate recollection if clinically indicated. Performed at [facility]    Report Status 07/28/2014 FINAL  Final      Studies/Results: Ct Angio Chest Pe W/cm &/or Wo Cm  07/26/2014   CLINICAL DATA:  Substernal chest pain with fevers and dysuria.  EXAM: CT ANGIOGRAPHY CHEST WITH CONTRAST   TECHNIQUE: Multidetector CT imaging of the chest was performed using the standard protocol during bolus administration of intravenous contrast. Multiplanar CT image reconstructions and MIPs were obtained to evaluate the vascular anatomy.  CONTRAST:  Omnipaque 350, dose currently unavailable.  COMPARISON:  None.  FINDINGS: THORACIC INLET/BODY WALL:  No acute abnormality.  MEDIASTINUM:  Normal heart size. No pericardial effusion. No acute vascular abnormality, including pulmonary embolism or aortic dissection. Note that sensitivity of CTA is limited by patient size. The thymus is prominent without definitive hyperplasia and no focal nodular area.  LUNG WINDOWS:  Dense airspace disease throughout the right lung consistent with pneumonia. There is early endobronchial spread of infection to the left lower lobe. Small, layering parapneumonic effusion present on the right. No cavitation or air leak.  UPPER ABDOMEN:  No acute findings.  OSSEOUS:  No acute fracture.  No suspicious lytic or blastic lesions.  Review of the MIP images confirms the above findings.  IMPRESSION: 1. Extensive right-sided pneumonia with small parapneumonic effusion. 2. CTA is limited by patient size. No evidence of pulmonary embolism.   Electronically Signed   By: Monte Fantasia M.D.   On: 07/26/2014 14:41   Dg Chest Port 1 View  07/26/2014   CLINICAL DATA:  Peripneumonic effusion.  EXAM: PORTABLE CHEST - 1 VIEW  COMPARISON:  Chest radiograph of January 12, 2013. CT scan of July 26, 2014.  FINDINGS: Stable cardiomediastinal silhouette. No pneumothorax is noted. Left lung is clear. New airspace opacity is noted in right lower lobe consistent with pneumonia. No significant pleural effusion is noted. Bony thorax is intact.  IMPRESSION: Right lower lobe pneumonia. Followup radiographs are recommended until resolution.   Electronically Signed   By: Marijo Conception, M.D.   On: 07/26/2014 17:51    Medications:  Scheduled: . amLODipine  10 mg Oral  Daily  . docusate sodium  100 mg Oral BID  . enoxaparin (LOVENOX) injection  40 mg Subcutaneous QHS  . levofloxacin  750 mg Oral Daily  . metoprolol tartrate  25 mg Oral BID  . prenatal multivitamin  1 tablet Oral Q1200  . sodium chloride  3 mL Intravenous Q12H   Continuous:  NKN:[id number] **OR** acetaminophen, hydrALAZINE, ipratropium-albuterol, morphine injection, ondansetron **OR** ondansetron (ZOFRAN) IV  Assessment/Plan:  Principal Problem:   Parapneumonic effusion Active Problems:   HTN (hypertension)   Hypokalemia   Leukocytosis    Acute respiratory failure with hypoxia / Parapneumonic effusion / HCAP Much improved. Can de-escalate antibiotics. Blood cultures negative so far. Saturating well on room air.  Sepsis secondary to HCAP / Leukocytosis Sepsis criteria met on admission with hypoxia, tachycardia, tachypnea, fever of 100.6 F, leukocytosis and source of infection pneumonia identified on CT angio chest. She is improved with antibiotics.  HCAP Change antibiotics to Levaquin. Symptomatically, she is improved. She is not hypoxic any further. Mobilize.  Chest pain Most likely due to the above. Troponins are insignificant. 2-D echocardiogram is as above. No further evaluation is necessary.  Postoperative anemia Hemoglobin is stable.  Mild Questionable Hematuria Possible that urine is mixed with vaginal discharge. She is status post C-section quite recently. Blood in the urine appears to be less than before. Continue to monitor. She will pursue this further as outpatient if persists.  HTN (hypertension), pregnancy-induced Blood pressure remains high. Amlodipine dose was increased. We will add beta blocker. Patient reassured. She was also noted to be tachycardic. Heart rate is improved. TSH is normal. Echocardiogram as above.  Hypokalemia Replaced  C- section 07/21/2014 Remains stable. Incision site looks clean. No drainage noted.  Morbid obesity Body mass  index is 52.3 kg/(m^2).  DVT Prophylaxis: Lovenox    Code Status: Full code  Family Communication: Discussed with patient  Disposition Plan: Not ready for discharge today. Mobilize.    LOS: 2 days   [facility] Pager [phone] 07/28/2014, 10:18 AM  If 7PM-7AM, please contact night-coverage at [email or web address], password [facility]

## 2014-07-29 LAB — CBC
HEMATOCRIT: 28.9 % — AB (ref 36.0–46.0)
HEMOGLOBIN: 9.3 g/dL — AB (ref 12.0–15.0)
MCH: 23.7 pg — AB (ref 26.0–34.0)
MCHC: 32.2 g/dL (ref 30.0–36.0)
MCV: 73.5 fL — ABNORMAL LOW (ref 78.0–100.0)
Platelets: 337 10*3/uL (ref 150–400)
RBC: 3.93 MIL/uL (ref 3.87–5.11)
RDW: 14.6 % (ref 11.5–15.5)
WBC: 10.4 10*3/uL (ref 4.0–10.5)

## 2014-07-29 LAB — BASIC METABOLIC PANEL
ANION GAP: 8 (ref 5–15)
BUN: 8 mg/dL (ref 6–23)
CALCIUM: 8.6 mg/dL (ref 8.4–10.5)
CO2: 25 mmol/L (ref 19–32)
CREATININE: 0.54 mg/dL (ref 0.50–1.10)
Chloride: 104 mmol/L (ref 96–112)
GFR calc non Af Amer: >90 mL/min (ref >90)
Glucose, Bld: 89 mg/dL (ref 70–99)
Potassium: 3.6 mmol/L (ref 3.5–5.1)
Sodium: 137 mmol/L (ref 135–145)

## 2014-07-29 LAB — GLUCOSE, CAPILLARY: Glucose-Capillary: 95 mg/dL (ref 70–99)

## 2014-07-29 MED ORDER — POLYSACCHARIDE IRON COMPLEX 150 MG PO CAPS
150.0000 mg | ORAL_CAPSULE | Freq: Two times a day (BID) | ORAL | Status: DC
Start: 2014-07-29 — End: 2014-11-14

## 2014-07-29 MED ORDER — METOPROLOL TARTRATE 25 MG PO TABS: 25.0000 mg | ORAL_TABLET | Freq: Two times a day (BID) | ORAL | Status: DC

## 2014-07-29 MED ORDER — DOCUSATE SODIUM 100 MG PO CAPS: 100.0000 mg | ORAL_CAPSULE | Freq: Two times a day (BID) | ORAL | Status: DC

## 2014-07-29 MED ORDER — LEVOFLOXACIN 750 MG PO TABS: 750.0000 mg | ORAL_TABLET | Freq: Every day | ORAL | Status: DC

## 2014-07-29 MED ORDER — AMLODIPINE BESYLATE 10 MG PO TABS: 10.0000 mg | ORAL_TABLET | Freq: Every day | ORAL | Status: DC

## 2014-07-29 NOTE — Discharge Summary (Signed)
[facility]  Physician Discharge Summary   Patient ID: Kimberly [name] MRN: [id number] DOB/AGE: [birth year] 24 y.o.  Admit date: 07/26/2014 Discharge date: 07/29/2014  PCP: No PCP Per Patient  DISCHARGE DIAGNOSES:  Principal Problem:   Parapneumonic effusion Active Problems:   HTN (hypertension)   Hypokalemia   Leukocytosis   RECOMMENDATIONS FOR OUTPATIENT FOLLOW UP: 1. Patient to discuss with her OB/GYN with regards to breast-feeding considering now she is on multiple new medications. 2. Follow-up for blood pressure management with healthcare provider  DISCHARGE CONDITION: fair  Diet recommendation: Low sodium  Filed Weights   07/27/14 0421 07/28/14 0503 07/29/14 0443  Weight: 157.444 kg (347 lb 1.6 oz) 156.264 kg (344 lb 8 oz) 155.312 kg (342 lb 6.4 oz)    INITIAL HISTORY: 24 year old African-American female who had a C-section on 07-21-14 at [facility]. She had gestational hypertension. She presented with worsening shortness of breath and was found to have right-sided pneumonia. She was subsequently admitted to the hospital.  Consultations:  None  Procedures: 2D ECHO Study Conclusions - Left ventricle: The cavity size was normal. There was mildconcentric hypertrophy. Systolic function was normal. Theestimated ejection fraction was in the range of 60% to 65%. Wallmotion was normal; there were no regional wall motionabnormalities. Left ventricular diastolic function parameters were normal. - Aortic valve: Trileaflet; normal thickness leaflets. There was noregurgitation. - Aortic root: The aortic root was normal in size. - Mitral valve: Structurally normal valve. - Left atrium: The atrium was mildly dilated. - Right ventricle: Systolic function was normal. - Right atrium: The atrium was normal in size. - Tricuspid valve: There was mild regurgitation. - Pulmonic valve: Structurally normal valve. There was noregurgitation. - Pulmonary arteries:  Systolic pressure was within the normalrange. - Inferior vena cava: The vessel was normal in size. - Pericardium, extracardiac: There was no pericardial effusion.  HOSPITAL COURSE:   Acute respiratory failure with hypoxia / Parapneumonic effusion / HCAP Patient was started on antibiotics. She was given oxygen initially. She improved quickly. Does not require oxygen anymore. Culture data is were reviewed and have been negative so far. She been ambulating without difficulty.   Sepsis secondary to HCAP / Leukocytosis Sepsis criteria met on admission with hypoxia, tachycardia, tachypnea, fever of 100.6 F, leukocytosis and source of infection pneumonia identified on CT angio chest. She is improved with antibiotics.  HCAP She was initially started on IV antibiotics. Changed over to oral Levaquin.   Chest pain Most likely due to the above. Troponin levels are insignificant. 2-D echocardiogram is as above. No further evaluation is necessary.  Postoperative anemia Hemoglobin is stable.  Mild Questionable Hematuria Possible that urine is mixed with vaginal discharge. She is status post C-section quite recently. Blood in the urine appears to be less than before. Continue to monitor. She will pursue this further as outpatient if persists.  HTN (hypertension), pregnancy-induced Blood pressure remained high. She was initially started on amlodipine. Dose was increased. Beta blocker was added. Blood pressures finally improved. She will need follow-up as an outpatient for same. She was also noted to be tachycardic. Heart rate is improved after initiating beta blocker for high blood pressure. TSH is normal. Echocardiogram as above.  Hypokalemia Replaced  C- section 07/21/2014 Remains stable. Incision site looks clean. No drainage noted.  Morbid obesity Body mass index is 52.3 kg/(m^2). Management as outpatient.  Patient was asked not to breast-feed her newborn child till she has had a chance to  discuss her medications with her obstetric  physician.  Patient remained stable and improved. She is okay for discharge.  PERTINENT LABS:  The results of significant diagnostics from this hospitalization (including imaging, microbiology, ancillary and laboratory) are listed below for reference.    Microbiology: Recent Results (from the past 240 hour(s))  Culture, blood (routine x 2)     Status: None (Preliminary result)   Collection Time: 07/26/14 11:14 AM  Result Value Ref Range Status   Specimen Description BLOOD RIGHT ANTECUBITAL  Final   Special Requests BOTTLES DRAWN AEROBIC AND ANAEROBIC 5ML  Final   Culture   Final           BLOOD CULTURE RECEIVED NO GROWTH TO DATE CULTURE WILL BE HELD FOR 5 DAYS BEFORE ISSUING A FINAL NEGATIVE REPORT Performed at [facility]    Report Status PENDING  Incomplete  Culture, blood (routine x 2)     Status: None (Preliminary result)   Collection Time: 07/26/14 11:14 AM  Result Value Ref Range Status   Specimen Description BLOOD LEFT HAND  Final   Special Requests BOTTLES DRAWN AEROBIC AND ANAEROBIC 3ML  Final   Culture   Final           BLOOD CULTURE RECEIVED NO GROWTH TO DATE CULTURE WILL BE HELD FOR 5 DAYS BEFORE ISSUING A FINAL NEGATIVE REPORT Performed at [facility]    Report Status PENDING  Incomplete  Urine culture     Status: None   Collection Time: 07/26/14  3:25 PM  Result Value Ref Range Status   Specimen Description URINE, RANDOM  Final   Special Requests NONE  Final   Colony Count   Final    30,000 COLONIES/ML Performed at [facility]    Culture   Final    Multiple bacterial morphotypes present, none predominant. Suggest appropriate recollection if clinically indicated. Performed at [facility]    Report Status 07/28/2014 FINAL  Final     Labs: Basic Metabolic Panel:  Recent Labs Lab 07/26/14 1120 07/26/14 1755 07/27/14 0500 07/28/14 0429 07/29/14 0500  NA 139 141 140 141 137    K 3.0* 3.2* 3.1* 3.7 3.6  CL 105 104 106 109 104  CO2 '24 26 23 23 25  ' GLUCOSE 116* 97 110* 93 89  BUN 6 5* <5* 6 8  CREATININE 0.63 0.54 0.45* 0.46* 0.54  CALCIUM 8.6 8.6 8.4 8.6 8.6  MG  --  1.8  --   --   --   PHOS  --  3.8  --   --   --    Liver Function Tests:  Recent Labs Lab 07/26/14 1120 07/26/14 1755 07/27/14 0500  AST '31 30 23  ' ALT '22 21 18  ' ALKPHOS 109 112 94  BILITOT 0.5 0.9 0.8  PROT 7.1 6.7 6.5  ALBUMIN 3.0* 2.9* 2.7*    Recent Labs Lab 07/26/14 1120  LIPASE 15   CBC:  Recent Labs Lab 07/26/14 1120 07/26/14 1755 07/27/14 0500 07/28/14 0429 07/29/14 0500  WBC 11.3* 11.3* 11.7* 10.8* 10.4  NEUTROABS 8.7* 8.6*  --   --   --   HGB 9.6* 9.2* 9.1* 9.0* 9.3*  HCT 31.0* 29.9* 28.2* 29.5* 28.9*  MCV 73.8* 73.3* 72.9* 73.4* 73.5*  PLT 359 389 330 337 337   Cardiac Enzymes:  Recent Labs Lab 07/26/14 1120 07/26/14 1755 07/26/14 2159 07/27/14 1112 07/28/14 0429  TROPONINI 0.03 0.04* 0.04* <0.03 0.06*   BNP: BNP (last 3 results)  Recent Labs  07/26/14 1114  BNP 135.6*   CBG:  Recent Labs Lab 07/27/14 0742 07/28/14 0734 07/29/14 0750  GLUCAP 104* 106* 95     IMAGING STUDIES Ct Angio Chest Pe W/cm &/or Wo Cm  07/26/2014   CLINICAL DATA:  Substernal chest pain with fevers and dysuria.  EXAM: CT ANGIOGRAPHY CHEST WITH CONTRAST  TECHNIQUE: Multidetector CT imaging of the chest was performed using the standard protocol during bolus administration of intravenous contrast. Multiplanar CT image reconstructions and MIPs were obtained to evaluate the vascular anatomy.  CONTRAST:  Omnipaque 350, dose currently unavailable.  COMPARISON:  None.  FINDINGS: THORACIC INLET/BODY WALL:  No acute abnormality.  MEDIASTINUM:  Normal heart size. No pericardial effusion. No acute vascular abnormality, including pulmonary embolism or aortic dissection. Note that sensitivity of CTA is limited by patient size. The thymus is prominent without definitive hyperplasia  and no focal nodular area.  LUNG WINDOWS:  Dense airspace disease throughout the right lung consistent with pneumonia. There is early endobronchial spread of infection to the left lower lobe. Small, layering parapneumonic effusion present on the right. No cavitation or air leak.  UPPER ABDOMEN:  No acute findings.  OSSEOUS:  No acute fracture.  No suspicious lytic or blastic lesions.  Review of the MIP images confirms the above findings.  IMPRESSION: 1. Extensive right-sided pneumonia with small parapneumonic effusion. 2. CTA is limited by patient size. No evidence of pulmonary embolism.   Electronically Signed   By: Monte Fantasia M.D.   On: 07/26/2014 14:41   Dg Chest Port 1 View  07/26/2014   CLINICAL DATA:  Peripneumonic effusion.  EXAM: PORTABLE CHEST - 1 VIEW  COMPARISON:  Chest radiograph of January 12, 2013. CT scan of July 26, 2014.  FINDINGS: Stable cardiomediastinal silhouette. No pneumothorax is noted. Left lung is clear. New airspace opacity is noted in right lower lobe consistent with pneumonia. No significant pleural effusion is noted. Bony thorax is intact.  IMPRESSION: Right lower lobe pneumonia. Followup radiographs are recommended until resolution.   Electronically Signed   By: Marijo Conception, M.D.   On: 07/26/2014 17:51    DISCHARGE EXAMINATION: Filed Vitals:   07/28/14 0503 07/28/14 1446 07/28/14 2135 07/29/14 0443  BP: 168/93 157/94 148/80 151/80  Pulse: 110 95 103 87  Temp: 98.1 F (36.7 C) 97.6 F (36.4 C) 98 F (36.7 C) 98 F (36.7 C)  TempSrc: Oral Oral Oral Oral  Resp:  '20 20 20  ' Height:      Weight: 156.264 kg (344 lb 8 oz)   155.312 kg (342 lb 6.4 oz)  SpO2: 98% 100% 100% 98%   General appearance: alert, cooperative, appears stated age, no distress and morbidly obese Resp: Initial entry on the right. No crackles, no wheezing Cardio: regular rate and rhythm, S1, S2 normal, no murmur, click, rub or gallop GI: soft, non-tender; bowel sounds normal; no masses,  no  organomegaly. Incision site looks clean Extremities: extremities normal, atraumatic, no cyanosis or edema  DISPOSITION: Home  Discharge Instructions    Call MD for:  extreme fatigue    Complete by:  As directed      Call MD for:  persistant dizziness or light-headedness    Complete by:  As directed      Call MD for:  persistant nausea and vomiting    Complete by:  As directed      Call MD for:  severe uncontrolled pain    Complete by:  As directed      Call  MD for:  temperature >100.4    Complete by:  As directed      Diet - low sodium heart healthy    Complete by:  As directed      Discharge instructions    Complete by:  As directed   Please have your BP checked regularly. Please don't breastfeed till you have talked to your OB/GYN. You need to be followed closely for BP management.     Increase activity slowly    Complete by:  As directed            ALLERGIES:  Allergies  Allergen Reactions  . Lemon Juice Itching and Other (See Comments)    Tongue swelling  . Orange Juice Itching and Other (See Comments)    Tongue swelling  . Citrus Itching and Swelling    Tongue swelling  . Penicillins Itching  . Sulfa Antibiotics Itching  . Amoxicillin Rash  . Latex Itching and Rash     Current Discharge Medication List    START taking these medications   Details  amLODipine (NORVASC) 10 MG tablet Take 1 tablet (10 mg total) by mouth daily. Qty: 30 tablet, Refills: 0    levofloxacin (LEVAQUIN) 750 MG tablet Take 1 tablet (750 mg total) by mouth daily. Starting 2/13 Qty: 4 tablet, Refills: 0    metoprolol tartrate (LOPRESSOR) 25 MG tablet Take 1 tablet (25 mg total) by mouth 2 (two) times daily. Qty: 60 tablet, Refills: 0      CONTINUE these medications which have NOT CHANGED   Details  calcium carbonate (TUMS - DOSED IN MG ELEMENTAL CALCIUM) 500 MG chewable tablet Chew 2 tablets by mouth 3 (three) times daily as needed for indigestion or heartburn.      oxyCODONE-acetaminophen (PERCOCET/ROXICET) 5-325 MG per tablet Take 1 tablet by mouth every 4 (four) hours as needed (for pain scale less than 7). Qty: 30 tablet, Refills: 0    Prenatal Vit-Fe Fumarate-FA (PRENATAL MULTIVITAMIN) TABS tablet Take 1 tablet by mouth daily at 12 noon.    butalbital-acetaminophen-caffeine (FIORICET) 50-325-40 MG per tablet Take 1-2 tablets by mouth every 6 (six) hours as needed for headache. Qty: 20 tablet, Refills: 0    Elastic Bandages & Supports (MEDICAL COMPRESSION STOCKINGS) MISC 1 Device by Does not apply route daily. Qty: 1 each, Refills: 1    famotidine (PEPCID) 20 MG tablet Take 1 tablet (20 mg total) by mouth 2 (two) times daily. Qty: 60 tablet, Refills: 2       Follow-up Information    Follow up with OG/GYN.   Why:  to discuss breast feeding while on current medications      TOTAL DISCHARGE TIME: 35 mins  [facility] [facility] Pager [phone]  07/29/2014, 10:37 AM

## 2014-07-29 NOTE — Discharge Instructions (Signed)

## 2014-08-01 LAB — CULTURE, BLOOD (ROUTINE X 2)
Culture: NO GROWTH
Culture: NO GROWTH

## 2014-10-31 ENCOUNTER — Other Ambulatory Visit: Payer: Self-pay | Admitting: Obstetrics & Gynecology

## 2014-11-01 LAB — CYTOLOGY - PAP

## 2014-11-14 ENCOUNTER — Encounter (HOSPITAL_BASED_OUTPATIENT_CLINIC_OR_DEPARTMENT_OTHER): Payer: Self-pay

## 2014-11-14 ENCOUNTER — Other Ambulatory Visit: Payer: Self-pay

## 2014-11-14 ENCOUNTER — Emergency Department (HOSPITAL_BASED_OUTPATIENT_CLINIC_OR_DEPARTMENT_OTHER)
Admission: EM | Admit: 2014-11-14 | Discharge: 2014-11-14 | Discharge status: Against Medical Advice | Payer: Medicaid Other | Attending: Emergency Medicine | Admitting: Emergency Medicine

## 2014-11-14 ENCOUNTER — Emergency Department (HOSPITAL_BASED_OUTPATIENT_CLINIC_OR_DEPARTMENT_OTHER): Payer: Medicaid Other

## 2014-11-14 DIAGNOSIS — E669 Obesity, unspecified: Secondary | ICD-10-CM | POA: Diagnosis not present

## 2014-11-14 DIAGNOSIS — R0602 Shortness of breath: Secondary | ICD-10-CM | POA: Diagnosis not present

## 2014-11-14 DIAGNOSIS — M549 Dorsalgia, unspecified: Secondary | ICD-10-CM | POA: Diagnosis present

## 2014-11-14 DIAGNOSIS — Z8742 Personal history of other diseases of the female genital tract: Secondary | ICD-10-CM | POA: Insufficient documentation

## 2014-11-14 DIAGNOSIS — R0789 Other chest pain: Secondary | ICD-10-CM | POA: Diagnosis not present

## 2014-11-14 DIAGNOSIS — Z880 Allergy status to penicillin: Secondary | ICD-10-CM | POA: Insufficient documentation

## 2014-11-14 DIAGNOSIS — I10 Essential (primary) hypertension: Secondary | ICD-10-CM | POA: Diagnosis not present

## 2014-11-14 DIAGNOSIS — Z91040 Latex allergy status: Secondary | ICD-10-CM | POA: Insufficient documentation

## 2014-11-14 DIAGNOSIS — R079 Chest pain, unspecified: Secondary | ICD-10-CM

## 2014-11-14 NOTE — ED Notes (Addendum)
Pt refused treatment because she did not want to be stuck for blood.  Multiple attempts were made to draw blood without success.  Pt did not let us attempt to draw blood above her hand.  [facility]  [facility] aware, attempted to talk to pt without success.

## 2014-11-14 NOTE — ED Notes (Signed)
C/o right side upper back with decrease movement to right arm-denies injury, also chest pain and pain when takes a deep breath x 4 days-states pain feels same as when she had pneumonia

## 2014-11-14 NOTE — ED Provider Notes (Signed)
CSN: [id number]     Arrival date & time 11/14/14  1139 History   First MD Initiated Contact with Patient 11/14/14 1202     Chief Complaint  Patient presents with  . Back Pain     (Consider location/radiation/quality/duration/timing/severity/associated sxs/prior Treatment) HPI Kimberly [name] is a 24 y.o. female with history of hypertension, obesity, prior pneumonia, presents to emergency department complaining of right upper chest pain, right upper back pain, pain in the right shoulder and upper arm. She states symptoms started 4 days ago. States pain is worsened with movement and when she picks up her son with that right arm. She states pain also worsened with deep breathing. She reports associated shortness of breath. She denies any fever or chills. No definite injuries but states she is right-handed and she has a 4284-month-old son that she picks up with the arm. She denies any recent travel. She is 3 months months postpartum. She states she was diagnosed with pneumonia several months ago, and this pain feels the same. She denies however any cough, no upper respiratory symptoms. She does report sore throat several days ago. She did not take any medications prior to coming in.  Past Medical History  Diagnosis Date  . Ovarian cyst     left side  . Hypertension   . Obesity   . Pregnant   . Pregnancy induced hypertension    Past Surgical History  Procedure Laterality Date  . Tonsillectomy    . Wisdom tooth extraction    . Dilation and curettage of uterus    . Cesarean section  04/09/2011    Procedure: CESAREAN SECTION;  Surgeon: Loney LaurenceMichelle A Horvath;  Location: [facility];  Service: Gynecology;  Laterality: N/A;  . Knee arthroscopy    . Cesarean section N/A 07/21/2014    Procedure: CESAREAN SECTION;  Surgeon: Freddrick MarchKendra H. Tenny Crawoss, MD;  Location: [facility];  Service: Obstetrics;  Laterality: N/A;  . Tubal ligation     Family History  Problem Relation Age of Onset  . Diabetes Mother   . Diabetes  Maternal Grandmother   . Diabetes Maternal Grandfather   . Diabetes Paternal Grandmother   . Stroke Paternal Grandmother    History  Substance Use Topics  . Smoking status: Never Smoker   . Smokeless tobacco: Not on file  . Alcohol Use: No   OB History    Gravida Para Term Preterm AB TAB SAB Ectopic Multiple Living   5 2 1 1 3 1 1 1  0 2     Review of Systems  Constitutional: Negative for fever and chills.  Respiratory: Positive for chest tightness and shortness of breath. Negative for cough.   Cardiovascular: Positive for chest pain. Negative for palpitations and leg swelling.  Gastrointestinal: Negative for nausea, vomiting, abdominal pain and diarrhea.  Genitourinary: Negative for dysuria, flank pain and pelvic pain.  Musculoskeletal: Positive for myalgias and back pain. Negative for arthralgias, neck pain and neck stiffness.  Skin: Negative for rash.  Neurological: Negative for dizziness, weakness, numbness and headaches.  All other systems reviewed and are negative.     Allergies  Lemon juice; Orange juice; Citrus; Penicillins; Sulfa antibiotics; Amoxicillin; and Latex  Home Medications   Prior to Admission medications   Not on File   BP 151/76 mmHg  Pulse 84  Temp(Src) 98.2 F (36.8 C) (Oral)  Resp 18  Ht 5\' 7"  (1.702 m)  Wt 328 lb (148.78 kg)  BMI 51.36 kg/m2  SpO2 97%  LMP 11/11/2014 Physical  Exam  Constitutional: She appears well-developed and well-nourished. No distress.  HENT:  Head: Normocephalic.  Eyes: Conjunctivae are normal.  Neck: Neck supple.  Cardiovascular: Normal rate, regular rhythm and normal heart sounds.   Pulmonary/Chest: Effort normal and breath sounds normal. No respiratory distress. She has no wheezes. She has no rales. She exhibits tenderness.  Tentative outpatient with a right pectoralis muscle, worsened with right arm movement  Abdominal: Soft. Bowel sounds are normal. She exhibits no distension. There is no tenderness. There is  no rebound.  Musculoskeletal: She exhibits no edema.  Tenderness to palpation over right trapezius and right parascapular muscles. Pain with range of motion of right shoulder in any direction. Full range of motion of the shoulder. Distal radial pulses intact.  Neurological: She is alert.  5 out of 5 and equal strength of biceps, triceps, deltoid.  Skin: Skin is warm and dry.  Psychiatric: She has a normal mood and affect. Her behavior is normal.  Nursing note and vitals reviewed.   ED Course  Procedures (including critical care time) Labs Review Labs Reviewed - No data to display  Imaging Review Dg Chest 2 View  11/14/2014   CLINICAL DATA:  Chest and back pain.  Right arm pain for 4 days.  EXAM: CHEST  2 VIEW  COMPARISON:  None.  FINDINGS: The heart size and mediastinal contours are within normal limits. Both lungs are clear. The visualized skeletal structures are unremarkable.  IMPRESSION: No active cardiopulmonary disease.   Electronically Signed   By: Signa Kell M.D.   On: 11/14/2014 12:32     EKG Interpretation None      MDM   Final diagnoses:  Right-sided chest pain   Patient with right sided chest pain and upper back pain that is worse with deep breathing. I can reproduce pain with palpation or movement of the arm. Patient however states she is short of breath. She is 3 months postpartum. She had similar pain right after deliver her baby, had full workup including CT angioma which showed extensive pneumonia. She was admitted a time. She states symptoms are exactly the same today. Patient is afebrile, she has normal vital signs except for mild hypertension. Given her symptoms will have to get blood work, chest x-ray, d-dimer.   Patient's chest x-ray is negative. She refused blood work, she understands the cannot rule out blood clot or any other abnormalities with just a chest x-ray. She will wishes to go home. I will discharge her AGAINST MEDICAL ADVICE.  Jaynie Crumble, PA-C 11/14/14 1700  Arby Barrette, MD 11/14/14 [phone]

## 2014-11-19 ENCOUNTER — Encounter (HOSPITAL_BASED_OUTPATIENT_CLINIC_OR_DEPARTMENT_OTHER): Payer: Self-pay

## 2014-11-19 ENCOUNTER — Emergency Department (HOSPITAL_BASED_OUTPATIENT_CLINIC_OR_DEPARTMENT_OTHER)
Admission: EM | Admit: 2014-11-19 | Discharge: 2014-11-20 | Disposition: A | Discharge status: Home / Self Care | Payer: Medicaid Other | Attending: Emergency Medicine | Admitting: Emergency Medicine

## 2014-11-19 DIAGNOSIS — Z91040 Latex allergy status: Secondary | ICD-10-CM | POA: Diagnosis not present

## 2014-11-19 DIAGNOSIS — E669 Obesity, unspecified: Secondary | ICD-10-CM | POA: Insufficient documentation

## 2014-11-19 DIAGNOSIS — Z880 Allergy status to penicillin: Secondary | ICD-10-CM | POA: Diagnosis not present

## 2014-11-19 DIAGNOSIS — J069 Acute upper respiratory infection, unspecified: Secondary | ICD-10-CM | POA: Diagnosis not present

## 2014-11-19 DIAGNOSIS — I10 Essential (primary) hypertension: Secondary | ICD-10-CM | POA: Insufficient documentation

## 2014-11-19 DIAGNOSIS — Z8742 Personal history of other diseases of the female genital tract: Secondary | ICD-10-CM | POA: Diagnosis not present

## 2014-11-19 DIAGNOSIS — H9203 Otalgia, bilateral: Secondary | ICD-10-CM | POA: Diagnosis present

## 2014-11-19 NOTE — ED Provider Notes (Signed)
CSN: [id number]     Arrival date & time 11/19/14  2342 History  This chart was scribed for Kimberly Arp, MD by Lyndel SafeKaitlyn Shelton, ED Scribe. This patient was seen in room MH04/MH04 and the patient's care was started 12:15 AM.  Chief Complaint  Patient presents with  . Otalgia   Patient is a 24 y.o. female presenting with ear pain. The history is provided by the patient. No language interpreter was used.  Otalgia Location:  Bilateral Behind ear:  No abnormality Quality:  Aching Severity:  Moderate Onset quality:  Sudden Duration:  7 days Timing:  Constant Progression:  Unchanged Chronicity:  New Context: not direct blow   Relieved by:  Nothing Worsened by:  Nothing tried Ineffective treatments:  OTC medications Associated symptoms: congestion and sore throat   Associated symptoms: no cough, no fever and no tinnitus   Risk factors: no recent travel    HPI Comments: [name] is a 24 y.o. female who presents to the Emergency Department complaining of constant, moderate otalgia bilaterally and sore throat onset 7 days ago. She states these symtoms started 7 days ago. The chest pain has since resolved. Pt reports she has taken Zyrtec pta but no tylenol. She also notes she has not been able to work the past couple of days. She denies fever.   Past Medical History  Diagnosis Date  . Ovarian cyst     left side  . Hypertension   . Obesity   . Pregnant   . Pregnancy induced hypertension    Past Surgical History  Procedure Laterality Date  . Tonsillectomy    . Wisdom tooth extraction    . Dilation and curettage of uterus    . Cesarean section  04/09/2011    Procedure: CESAREAN SECTION;  Surgeon: Loney LaurenceMichelle A Horvath;  Location: [facility];  Service: Gynecology;  Laterality: N/A;  . Knee arthroscopy    . Cesarean section N/A 07/21/2014    Procedure: CESAREAN SECTION;  Surgeon: Freddrick MarchKendra H. Tenny Crawoss, MD;  Location: [facility];  Service: Obstetrics;  Laterality: N/A;  . Tubal ligation     Family  History  Problem Relation Age of Onset  . Diabetes Mother   . Diabetes Maternal Grandmother   . Diabetes Maternal Grandfather   . Diabetes Paternal Grandmother   . Stroke Paternal Grandmother    History  Substance Use Topics  . Smoking status: Never Smoker   . Smokeless tobacco: Not on file  . Alcohol Use: No   OB History    Gravida Para Term Preterm AB TAB SAB Ectopic Multiple Living   5 2 1 1 3 1 1 1  0 2     Review of Systems  Constitutional: Negative for fever.  HENT: Positive for congestion, ear pain and sore throat. Negative for tinnitus, trouble swallowing and voice change.   Respiratory: Negative for cough and shortness of breath.   All other systems reviewed and are negative.  Allergies  Lemon juice; Orange juice; Citrus; Penicillins; Sulfa antibiotics; Amoxicillin; and Latex  Home Medications   Prior to Admission medications   Not on File   BP 128/88 mmHg  Pulse 99  Temp(Src) 98.4 F (36.9 C) (Oral)  Resp 14  Ht 5\' 8"  (1.727 m)  Wt 350 lb (158.759 kg)  BMI 53.23 kg/m2  SpO2 99%  LMP 11/11/2014   Physical Exam  Constitutional: She appears well-developed and well-nourished.  HENT:  Head: Normocephalic and atraumatic.  Right Ear: External ear normal.  Left Ear:  External ear normal.  Mouth/Throat: Oropharynx is clear and moist. No oropharyngeal exudate.  Clear, colorless post nasal drip with cobblestoning.  Tm normal bilaterally.   Eyes: Conjunctivae are normal. Pupils are equal, round, and reactive to light. Right eye exhibits no discharge. Left eye exhibits no discharge. No scleral icterus.  Neck: Normal range of motion. Neck supple.  No supraclavicular lymphopathy.   Cardiovascular: Normal rate, regular rhythm and normal heart sounds.   Pulmonary/Chest: Effort normal and breath sounds normal. No stridor. No respiratory distress. She has no wheezes. She has no rales. She exhibits no tenderness.  Abdominal: Soft. Bowel sounds are normal. There is no  tenderness. There is no rebound and no guarding.  Musculoskeletal: Normal range of motion. She exhibits no edema.  Lymphadenopathy:    She has no cervical adenopathy.  Neurological: She is alert. She has normal reflexes. Coordination normal.  Skin: Skin is warm and dry. No rash noted. She is not diaphoretic. No erythema.  Psychiatric: She has a normal mood and affect.  Nursing note and vitals reviewed.   ED Course  Procedures  DIAGNOSTIC STUDIES: Oxygen Saturation is 99% on RA, normal by my interpretation.    COORDINATION OF CARE: 12:16 AM Discussed treatment plan which includes to order 800 mg ibuprofen. Discussed no signs of infection. Recommended Flonase. Pt acknowledges and agrees to plan.   Labs Review Labs Reviewed - No data to display  Imaging Review No results found.   EKG Interpretation None      MDM   Final diagnoses:  None   Based on [facility] criteria there is no indication for further testing and treatment, has not taken any pain medication  Viral URI.  Will treat symptomatically and give off from work in am.    I personally performed the services described in this documentation, which was scribed in my presence. The recorded information has been reviewed and is accurate.    Cy Blamer, MD 11/20/14 [phone]

## 2014-11-19 NOTE — ED Notes (Signed)
Pt reports one week of sore throat, ear pain, sinus congestion, nasal drainage. Seen here one week ago for same.

## 2014-11-20 ENCOUNTER — Encounter (HOSPITAL_BASED_OUTPATIENT_CLINIC_OR_DEPARTMENT_OTHER): Payer: Self-pay | Admitting: Emergency Medicine

## 2014-11-20 MED ORDER — FLUTICASONE PROPIONATE 50 MCG/ACT NA SUSP: 2.0000 | Freq: Every day | NASAL | Status: DC

## 2014-11-20 MED ORDER — LIDOCAINE VISCOUS 2 % MT SOLN
15.0000 mL | Freq: Once | OROMUCOSAL | Status: AC
  Administered 2014-11-20: 15 mL via OROMUCOSAL
  Filled 2014-11-20: qty 15

## 2014-11-20 MED ORDER — IBUPROFEN 800 MG PO TABS
800.0000 mg | ORAL_TABLET | Freq: Once | ORAL | Status: AC
  Administered 2014-11-20: 800 mg via ORAL
  Filled 2014-11-20: qty 1

## 2014-11-20 MED ORDER — IBUPROFEN 800 MG PO TABS: 800.0000 mg | ORAL_TABLET | Freq: Three times a day (TID) | ORAL | Status: DC

## 2014-11-20 NOTE — Discharge Instructions (Signed)
Cool Mist Vaporizers °Vaporizers may help relieve the symptoms of a cough and cold. They add moisture to the air, which helps mucus to become thinner and less sticky. This makes it easier to breathe and cough up secretions. Cool mist vaporizers do not cause serious burns like hot mist vaporizers, which may also be called steamers or humidifiers. Vaporizers have not been proven to help with colds. You should not use a vaporizer if you are allergic to mold. °HOME CARE INSTRUCTIONS °· Follow the package instructions for the vaporizer. °· Do not use anything other than distilled water in the vaporizer. °· Do not run the vaporizer all of the time. This can cause mold or bacteria to grow in the vaporizer. °· Clean the vaporizer after each time it is used. °· Clean and dry the vaporizer well before storing it. °· Stop using the vaporizer if worsening respiratory symptoms develop. °Document Released: 02/29/2004 Document Revised: 06/08/2013 Document Reviewed: 10/21/2012 °[employer]® Patient Information ©2015 [employer]. This information is not intended to replace advice given to you by your health care provider. Make sure you discuss any questions you have with your health care provider. ° °

## 2014-11-20 NOTE — ED Notes (Signed)
[facility], pt seen by EDP prior to RN assessment, see MD notes, pending orders.

## 2014-11-21 ENCOUNTER — Emergency Department (HOSPITAL_BASED_OUTPATIENT_CLINIC_OR_DEPARTMENT_OTHER)
Admission: EM | Admit: 2014-11-21 | Discharge: 2014-11-22 | Disposition: A | Discharge status: Home / Self Care | Payer: Medicaid Other | Attending: Emergency Medicine | Admitting: Emergency Medicine

## 2014-11-21 ENCOUNTER — Emergency Department (HOSPITAL_BASED_OUTPATIENT_CLINIC_OR_DEPARTMENT_OTHER): Payer: Medicaid Other

## 2014-11-21 ENCOUNTER — Encounter (HOSPITAL_BASED_OUTPATIENT_CLINIC_OR_DEPARTMENT_OTHER): Payer: Self-pay | Admitting: Emergency Medicine

## 2014-11-21 ENCOUNTER — Emergency Department (HOSPITAL_COMMUNITY)
Admission: EM | Admit: 2014-11-21 | Discharge: 2014-11-21 | Discharge status: Against Medical Advice | Payer: Medicaid Other | Attending: Emergency Medicine | Admitting: Emergency Medicine

## 2014-11-21 DIAGNOSIS — I10 Essential (primary) hypertension: Secondary | ICD-10-CM | POA: Diagnosis not present

## 2014-11-21 DIAGNOSIS — E669 Obesity, unspecified: Secondary | ICD-10-CM | POA: Insufficient documentation

## 2014-11-21 DIAGNOSIS — R070 Pain in throat: Secondary | ICD-10-CM | POA: Diagnosis not present

## 2014-11-21 DIAGNOSIS — R0602 Shortness of breath: Secondary | ICD-10-CM | POA: Insufficient documentation

## 2014-11-21 DIAGNOSIS — H9203 Otalgia, bilateral: Secondary | ICD-10-CM | POA: Insufficient documentation

## 2014-11-21 DIAGNOSIS — R51 Headache: Secondary | ICD-10-CM | POA: Insufficient documentation

## 2014-11-21 DIAGNOSIS — Z8742 Personal history of other diseases of the female genital tract: Secondary | ICD-10-CM | POA: Diagnosis not present

## 2014-11-21 DIAGNOSIS — Z880 Allergy status to penicillin: Secondary | ICD-10-CM | POA: Diagnosis not present

## 2014-11-21 DIAGNOSIS — J069 Acute upper respiratory infection, unspecified: Secondary | ICD-10-CM | POA: Diagnosis not present

## 2014-11-21 DIAGNOSIS — J029 Acute pharyngitis, unspecified: Secondary | ICD-10-CM | POA: Diagnosis present

## 2014-11-21 DIAGNOSIS — Z791 Long term (current) use of non-steroidal anti-inflammatories (NSAID): Secondary | ICD-10-CM | POA: Diagnosis not present

## 2014-11-21 DIAGNOSIS — Z7951 Long term (current) use of inhaled steroids: Secondary | ICD-10-CM | POA: Diagnosis not present

## 2014-11-21 DIAGNOSIS — Z91040 Latex allergy status: Secondary | ICD-10-CM | POA: Insufficient documentation

## 2014-11-21 LAB — RAPID STREP SCREEN (MED CTR MEBANE ONLY): Streptococcus, Group A Screen (Direct): NEGATIVE

## 2014-11-21 MED ORDER — GI COCKTAIL ~~LOC~~
30.0000 mL | Freq: Once | ORAL | Status: AC
Start: 2014-11-21 — End: 2014-11-22
  Administered 2014-11-22: 30 mL via ORAL
  Filled 2014-11-21: qty 30

## 2014-11-21 MED ORDER — MAGIC MOUTHWASH: 5.0000 mL | Freq: Four times a day (QID) | ORAL | Status: DC | PRN

## 2014-11-21 NOTE — ED Notes (Signed)
24 yo pt c/o sore throat and cold symptoms for a week. Pt states the meds she received yesterday are not working.

## 2014-11-21 NOTE — ED Provider Notes (Signed)
CSN: [id number]     Arrival date & time 11/21/14  2229 History   This chart was scribed for Luay Balding, MD by Octavia Heir, ED Scribe. This patient was seen in room MH07/MH07 and the patient's care was started at 11:35 PM.     Chief Complaint  Patient presents with  . URI      Patient is a 24 y.o. female presenting with URI. The history is provided by the patient. No language interpreter was used.  URI Presenting symptoms: congestion, ear pain and sore throat   Presenting symptoms: no cough and no fever   Severity:  Moderate Onset quality:  Sudden Duration:  1 week Timing:  Constant Progression:  Unchanged Chronicity:  New Relieved by:  Nothing Worsened by:  Nothing tried Ineffective treatments:  OTC medications Associated symptoms: no arthralgias, no swollen glands and no wheezing   Risk factors: not elderly and no immunosuppression     Kimberly [name] is a 24 y.o. female who presents to the Emergency Department complaining of constant, moderate otalgia bilaterally and sore throat onset 1 week ago. She states these symtoms started 7 days ago. The chest pain has since resolved. Pt reports she has taken Zyrtec pta but no tylenol. She also notes she has not been able to work the past couple of days. She denies fever.  Past Medical History  Diagnosis Date  . Ovarian cyst     left side  . Hypertension   . Obesity   . Pregnant   . Pregnancy induced hypertension    Past Surgical History  Procedure Laterality Date  . Tonsillectomy    . Wisdom tooth extraction    . Dilation and curettage of uterus    . Cesarean section  04/09/2011    Procedure: CESAREAN SECTION;  Surgeon: Loney Laurence;  Location: [facility];  Service: Gynecology;  Laterality: N/A;  . Knee arthroscopy    . Cesarean section N/A 07/21/2014    Procedure: CESAREAN SECTION;  Surgeon: Freddrick March. Tenny Craw, MD;  Location: [facility];  Service: Obstetrics;  Laterality: N/A;  . Tubal ligation     Family History  Problem  Relation Age of Onset  . Diabetes Mother   . Diabetes Maternal Grandmother   . Diabetes Maternal Grandfather   . Diabetes Paternal Grandmother   . Stroke Paternal Grandmother    History  Substance Use Topics  . Smoking status: Never Smoker   . Smokeless tobacco: Not on file  . Alcohol Use: No   OB History    Gravida Para Term Preterm AB TAB SAB Ectopic Multiple Living   0 2     Review of Systems  Constitutional: Negative for fever.  HENT: Positive for congestion, ear pain and sore throat. Negative for tinnitus, trouble swallowing and voice change.   Respiratory: Negative for cough, wheezing and stridor.   Cardiovascular: Negative for chest pain.  Musculoskeletal: Negative for arthralgias.  All other systems reviewed and are negative.     Allergies  Lemon juice; Orange juice; Citrus; Penicillins; Sulfa antibiotics; Amoxicillin; and Latex  Home Medications   Prior to Admission medications   Medication Sig Start Date End Date Taking? Authorizing Provider  fluticasone (FLONASE) 50 MCG/ACT nasal spray Place 2 sprays into both nostrils daily. 11/20/14   Taurean Ju, MD  ibuprofen (ADVIL,MOTRIN) 800 MG tablet Take 1 tablet (800 mg total) by mouth 3 (three) times daily. 11/20/14   Cy Blamer, MD  BP 134/91 mmHg  Pulse 109  Temp(Src) 98.2 F (36.8 C)  Resp 18  Ht 5\' 8"  (1.727 m)  Wt 333 lb (151.048 kg)  BMI 50.64 kg/m2  SpO2 99%  LMP 11/11/2014 Physical Exam  Constitutional: She is oriented to person, place, and time. She appears well-developed and well-nourished. No distress.  HENT:  Head: Normocephalic and atraumatic.  Right Ear: Tympanic membrane normal. No mastoid tenderness. Tympanic membrane is not injected, not perforated, not erythematous, not retracted and not bulging.  Left Ear: Tympanic membrane normal. No mastoid tenderness. Tympanic membrane is not injected, not perforated, not erythematous, not retracted and not bulging.  Mouth/Throat:  Oropharynx is clear and moist. No trismus in the jaw. No oropharyngeal exudate.  Eyes: EOM are normal. Pupils are equal, round, and reactive to light.  Neck: Normal range of motion. Neck supple. No tracheal deviation present.  Intact phonation no pain with displacement of the trachea  Cardiovascular: Normal rate, regular rhythm and intact distal pulses.   Pulmonary/Chest: Effort normal and breath sounds normal. No respiratory distress. She has no wheezes. She has no rales.  Abdominal: Soft. Bowel sounds are normal. There is no tenderness. There is no rebound and no guarding.  Musculoskeletal: Normal range of motion.  Lymphadenopathy:    She has no cervical adenopathy.  Neurological: She is alert and oriented to person, place, and time. She has normal reflexes.  Skin: Skin is warm and dry.  Psychiatric: She has a normal mood and affect.  Nursing note and vitals reviewed.   ED Course  Procedures  DIAGNOSTIC STUDIES: Oxygen Saturation is 99% on RA, normal by my interpretation.  COORDINATION OF CARE:  11:38 PM Discussed treatment plan which includes breathe right strip, Mucinex, "magic mouth wash" with pt at bedside and pt agreed to plan.   Labs Review Labs Reviewed  RAPID STREP SCREEN (NOT AT [facility])  CULTURE, GROUP A STREP    Imaging Review Dg Chest 2 View  11/21/2014   CLINICAL DATA:  Cough with sore throat for at least 1 week.  EXAM: CHEST  2 VIEW  COMPARISON:  11/14/2014.  FINDINGS: The heart size and mediastinal contours are within normal limits. Both lungs are clear. The visualized skeletal structures are unremarkable.  IMPRESSION: No active cardiopulmonary disease.  Stable exam.   Electronically Signed   By: Davonna BellingJohn  Curnes M.D.   On: 11/21/2014 22:02     EKG Interpretation None      MDM   Final diagnoses:  None    Symptoms are viral.  Negative xray and strep.  Continue symptomatic treatment follow up with your PMD.    I personally performed the services described in  this documentation, which was scribed in my presence. The recorded information has been reviewed and is accurate.    Cy BlamerApril Garald Rhew, MD 11/22/14 [phone]

## 2014-11-21 NOTE — ED Notes (Signed)
Pt states that she is going to go to [facility]

## 2014-11-21 NOTE — Discharge Instructions (Signed)
Cool Mist Vaporizers °Vaporizers may help relieve the symptoms of a cough and cold. They add moisture to the air, which helps mucus to become thinner and less sticky. This makes it easier to breathe and cough up secretions. Cool mist vaporizers do not cause serious burns like hot mist vaporizers, which may also be called steamers or humidifiers. Vaporizers have not been proven to help with colds. You should not use a vaporizer if you are allergic to mold. °HOME CARE INSTRUCTIONS °· Follow the package instructions for the vaporizer. °· Do not use anything other than distilled water in the vaporizer. °· Do not run the vaporizer all of the time. This can cause mold or bacteria to grow in the vaporizer. °· Clean the vaporizer after each time it is used. °· Clean and dry the vaporizer well before storing it. °· Stop using the vaporizer if worsening respiratory symptoms develop. °Document Released: 02/29/2004 Document Revised: 06/08/2013 Document Reviewed: 10/21/2012 °[employer]® Patient Information ©2015 [employer]. This information is not intended to replace advice given to you by your health care provider. Make sure you discuss any questions you have with your health care provider. ° °

## 2014-11-21 NOTE — ED Notes (Signed)
No response from patient when called for 2nd time to triage.

## 2014-11-21 NOTE — ED Notes (Signed)
No response x one when pt called from lobby for triage.

## 2014-11-22 ENCOUNTER — Encounter (HOSPITAL_BASED_OUTPATIENT_CLINIC_OR_DEPARTMENT_OTHER): Payer: Self-pay | Admitting: Emergency Medicine

## 2014-11-24 LAB — CULTURE, GROUP A STREP: Strep A Culture: NEGATIVE

## 2015-03-04 ENCOUNTER — Emergency Department (HOSPITAL_BASED_OUTPATIENT_CLINIC_OR_DEPARTMENT_OTHER)
Admission: EM | Admit: 2015-03-04 | Discharge: 2015-03-04 | Disposition: A | Discharge status: Home / Self Care | Payer: Medicaid Other | Attending: Emergency Medicine | Admitting: Emergency Medicine

## 2015-03-04 ENCOUNTER — Encounter (HOSPITAL_BASED_OUTPATIENT_CLINIC_OR_DEPARTMENT_OTHER): Payer: Self-pay | Admitting: Emergency Medicine

## 2015-03-04 DIAGNOSIS — E669 Obesity, unspecified: Secondary | ICD-10-CM | POA: Diagnosis not present

## 2015-03-04 DIAGNOSIS — H9201 Otalgia, right ear: Secondary | ICD-10-CM | POA: Diagnosis present

## 2015-03-04 DIAGNOSIS — Z91040 Latex allergy status: Secondary | ICD-10-CM | POA: Insufficient documentation

## 2015-03-04 DIAGNOSIS — Z8742 Personal history of other diseases of the female genital tract: Secondary | ICD-10-CM | POA: Insufficient documentation

## 2015-03-04 DIAGNOSIS — J029 Acute pharyngitis, unspecified: Secondary | ICD-10-CM | POA: Insufficient documentation

## 2015-03-04 DIAGNOSIS — H6691 Otitis media, unspecified, right ear: Secondary | ICD-10-CM | POA: Diagnosis not present

## 2015-03-04 DIAGNOSIS — Z7951 Long term (current) use of inhaled steroids: Secondary | ICD-10-CM | POA: Insufficient documentation

## 2015-03-04 DIAGNOSIS — I10 Essential (primary) hypertension: Secondary | ICD-10-CM | POA: Insufficient documentation

## 2015-03-04 DIAGNOSIS — Z791 Long term (current) use of non-steroidal anti-inflammatories (NSAID): Secondary | ICD-10-CM | POA: Insufficient documentation

## 2015-03-04 DIAGNOSIS — Z880 Allergy status to penicillin: Secondary | ICD-10-CM | POA: Insufficient documentation

## 2015-03-04 LAB — RAPID STREP SCREEN (MED CTR MEBANE ONLY): Streptococcus, Group A Screen (Direct): NEGATIVE

## 2015-03-04 MED ORDER — AZITHROMYCIN 250 MG PO TABS: 250.0000 mg | ORAL_TABLET | Freq: Every day | ORAL | Status: DC

## 2015-03-04 MED ORDER — DEXAMETHASONE 4 MG PO TABS
12.0000 mg | ORAL_TABLET | Freq: Once | ORAL | Status: AC
  Administered 2015-03-04: 12 mg via ORAL
  Filled 2015-03-04: qty 3

## 2015-03-04 MED ORDER — IBUPROFEN 800 MG PO TABS: 800.0000 mg | ORAL_TABLET | Freq: Three times a day (TID) | ORAL | Status: DC

## 2015-03-04 NOTE — ED Notes (Addendum)
Pt also c/o sore throat since yesterday along with blurring vision "off and on".

## 2015-03-04 NOTE — ED Notes (Signed)
Presents with rt ear pain, throat pain, chills x 24 hours

## 2015-03-04 NOTE — ED Notes (Signed)
Pt c/o right ear pain and feeling "cold" for past two days.

## 2015-03-04 NOTE — Discharge Instructions (Signed)
Otitis Media Otitis media is redness, soreness, and inflammation of the middle ear. Otitis media may be caused by allergies or, most commonly, by infection. Often it occurs as a complication of the common cold. SIGNS AND SYMPTOMS Symptoms of otitis media may include:  Earache.  Fever.  Ringing in your ear.  Headache.  Leakage of fluid from the ear. DIAGNOSIS To diagnose otitis media, your health care provider will examine your ear with an otoscope. This is an instrument that allows your health care provider to see into your ear in order to examine your eardrum. Your health care provider also will ask you questions about your symptoms. TREATMENT  Typically, otitis media resolves on its own within 3-5 days. Your health care provider may prescribe medicine to ease your symptoms of pain. If otitis media does not resolve within 5 days or is recurrent, your health care provider may prescribe antibiotic medicines if he or she suspects that a bacterial infection is the cause. HOME CARE INSTRUCTIONS   If you were prescribed an antibiotic medicine, finish it all even if you start to feel better.  Take medicines only as directed by your health care provider.  Keep all follow-up visits as directed by your health care provider. SEEK MEDICAL CARE IF:  You have otitis media only in one ear, or bleeding from your nose, or both.  You notice a lump on your neck.  You are not getting better in 3-5 days.  You feel worse instead of better. SEEK IMMEDIATE MEDICAL CARE IF:   You have pain that is not controlled with medicine.  You have swelling, redness, or pain around your ear or stiffness in your neck.  You notice that part of your face is paralyzed.  You notice that the bone behind your ear (mastoid) is tender when you touch it. MAKE SURE YOU:   Understand these instructions.  Will watch your condition.  Will get help right away if you are not doing well or get worse. Upper Respiratory  Infection, Adult An upper respiratory infection (URI) is also known as the common cold. It is often caused by a type of germ (virus). Colds are easily spread (contagious). You can pass it to others by kissing, coughing, sneezing, or drinking out of the same glass. Usually, you get better in 1 or 2 weeks.  HOME CARE  Only take medicine as told by your doctor. Use a warm mist humidifier or breathe in steam from a hot shower. Drink enough water and fluids to keep your pee (urine) clear or pale yellow. Get plenty of rest. Return to work when your temperature is back to normal or as told by your doctor. You may use a face mask and wash your hands to stop your cold from spreading. GET HELP RIGHT AWAY IF:  After the first few days, you feel you are getting worse. You have questions about your medicine. You have chills, shortness of breath, or brown or red spit (mucus). You have yellow or brown snot (nasal discharge) or pain in the face, especially when you bend forward. You have a fever, puffy (swollen) neck, pain when you swallow, or white spots in the back of your throat. You have a bad headache, ear pain, sinus pain, or chest pain. You have a high-pitched whistling sound when you breathe in and out (wheezing). You have a lasting cough or cough up blood. You have sore muscles or a stiff neck. MAKE SURE YOU:  Understand these instructions. Will watch your  condition. Will get help right away if you are not doing well or get worse. Document Released: 11/20/2007 Document Revised: 08/26/2011 Document Reviewed: 09/08/2013 [facility] Patient Information 2015 [place], [place]. This information is not intended to replace advice given to you by your health care provider. Make sure you discuss any questions you have with your health care provider.  Document Released: 03/08/2004 Document Revised: 10/18/2013 Document Reviewed: 12/29/2012 [facility] Patient Information 2015 [place], [place]. This information is not  intended to replace advice given to you by your health care provider. Make sure you discuss any questions you have with your health care provider.

## 2015-03-04 NOTE — ED Provider Notes (Signed)
CSN: [id number]     Arrival date & time 03/04/15  1527 History  This chart was scribed for Elwin Mocha, MD by Doreatha Martin, ED Scribe. This patient was seen in room MHT13/MHT13 and the patient's care was started at 5:15 PM.      Chief Complaint  Patient presents with  . Otalgia  . Chills   Patient is a 24 y.o. female presenting with ear pain. The history is provided by the patient. No language interpreter was used.  Otalgia Location:  Right Quality:  Sharp Severity:  Moderate Onset quality:  Gradual Duration:  2 days Timing:  Constant Progression:  Worsening Chronicity:  Recurrent Worsened by:  Swallowing Associated symptoms: fever ( subjective), headaches and sore throat   Associated symptoms: no abdominal pain, no cough, no diarrhea and no vomiting   Risk factors: chronic ear infection     HPI Comments: Kimberly [name] is a 24 y.o. female who presents to the Emergency Department complaining of moderate, sharp right otalgia onset 2 days ago with associated sore throat, subjective fever, chills, HA, intermittent blurry vision. Pt states sore throat is worsened with swallowing but is otherwise constant. Pt reports that current otalgia feels similar to hx of ear infections. No sick contact, sharing drinks. She denies SOB, vomiting, diarrhea, cough, difficulty swallowing, abdominal pain.    Past Medical History  Diagnosis Date  . Ovarian cyst     left side  . Hypertension   . Obesity   . Pregnant   . Pregnancy induced hypertension    Past Surgical History  Procedure Laterality Date  . Tonsillectomy    . Wisdom tooth extraction    . Dilation and curettage of uterus    . Cesarean section  04/09/2011    Procedure: CESAREAN SECTION;  Surgeon: Loney Laurence;  Location: [facility];  Service: Gynecology;  Laterality: N/A;  . Knee arthroscopy    . Cesarean section N/A 07/21/2014    Procedure: CESAREAN SECTION;  Surgeon: Freddrick March. Tenny Craw, MD;  Location: [facility];  Service: Obstetrics;   Laterality: N/A;  . Tubal ligation     Family History  Problem Relation Age of Onset  . Diabetes Mother   . Diabetes Maternal Grandmother   . Diabetes Maternal Grandfather   . Diabetes Paternal Grandmother   . Stroke Paternal Grandmother    Social History  Substance Use Topics  . Smoking status: Never Smoker   . Smokeless tobacco: None  . Alcohol Use: No   OB History    Gravida Para Term Preterm AB TAB SAB Ectopic Multiple Living   5 2 1 1 3 1 1 1  0 2     Review of Systems  Constitutional: Positive for fever ( subjective) and chills.  HENT: Positive for ear pain and sore throat. Negative for trouble swallowing.   Eyes: Positive for visual disturbance ( intermittent blurry vision).  Respiratory: Negative for cough and shortness of breath.   Gastrointestinal: Negative for vomiting, abdominal pain and diarrhea.  Neurological: Positive for headaches.  All other systems reviewed and are negative.  Allergies  Lemon juice; Orange juice; Citrus; Penicillins; Sulfa antibiotics; Amoxicillin; and Latex  Home Medications   Prior to Admission medications   Medication Sig Start Date End Date Taking? Authorizing Provider  Alum & Mag Hydroxide-Simeth (MAGIC MOUTHWASH) SOLN Take 5 mLs by mouth 4 (four) times daily as needed for mouth pain. 11/21/14   April Palumbo, MD  fluticasone [facility]) 50 MCG/ACT nasal spray Place 2 sprays into  both nostrils daily. 11/20/14   April Palumbo, MD  ibuprofen (ADVIL,MOTRIN) 800 MG tablet Take 1 tablet (800 mg total) by mouth 3 (three) times daily. 11/20/14   April Palumbo, MD   BP 149/78 mmHg  Pulse 101  Temp(Src) 99.4 F (37.4 C) (Oral)  Resp 18  Ht  (1.676 m)  Wt 350 lb (158.759 kg)  BMI 56.52 kg/m2  SpO2 100%  LMP 02/25/2015 (Approximate) Physical Exam  Constitutional: She is oriented to person, place, and time. She appears well-developed and well-nourished. No distress.  HENT:  Head: Normocephalic and atraumatic.  Right Ear: Tympanic  membrane is erythematous (mild) and bulging (mild).  Mouth/Throat: Oropharyngeal exudate (R tonsil) and posterior oropharyngeal erythema (right) present. No posterior oropharyngeal edema.  Eyes: EOM are normal. Pupils are equal, round, and reactive to light.  Neck: Normal range of motion. Neck supple.  Cardiovascular: Normal rate and regular rhythm.  Exam reveals no friction rub.   No murmur heard. Pulmonary/Chest: Effort normal and breath sounds normal. No respiratory distress. She has no wheezes. She has no rales.  Abdominal: Soft. She exhibits no distension. There is no tenderness. There is no rebound.  Musculoskeletal: Normal range of motion. She exhibits no edema.  Neurological: She is alert and oriented to person, place, and time.  Skin: She is not diaphoretic.  Nursing note and vitals reviewed.   ED Course  Procedures (including critical care time) DIAGNOSTIC STUDIES: Oxygen Saturation is 100% on RA, normal by my interpretation.    COORDINATION OF CARE: 5:18 PM Discussed treatment plan with pt at bedside and pt agreed to plan.   Labs Review Labs Reviewed  RAPID STREP SCREEN (NOT AT [facility])  CULTURE, GROUP A STREP    Imaging Review No results found. I have personally reviewed and evaluated these images and lab results as part of my medical decision-making.   EKG Interpretation None      MDM   Final diagnoses:  Pharyngitis  Acute right otitis media, recurrence not specified, unspecified otitis media type    72 show female with URI symptoms of throat pain, right ear pain. No fever or cough. She has some headache and mild dizziness. She has some subjective fevers and chills without documented temperature. He right ear with some mild bulging in her right TM with mild erythema. She has some pharyngeal exudates on the right. She is negative for strep. We'll treat with amoxicillin for probable otitis, give Decadron for symptom control. I personally performed the services  described in this documentation, which was scribed in my presence. The recorded information has been reviewed and is accurate.     Elwin Mocha, MD 03/04/15 [phone]

## 2015-03-06 LAB — CULTURE, GROUP A STREP: STREP A CULTURE: NEGATIVE

## 2015-07-14 ENCOUNTER — Encounter (HOSPITAL_BASED_OUTPATIENT_CLINIC_OR_DEPARTMENT_OTHER): Payer: Self-pay | Admitting: *Deleted

## 2015-07-14 ENCOUNTER — Emergency Department (HOSPITAL_BASED_OUTPATIENT_CLINIC_OR_DEPARTMENT_OTHER)
Admission: EM | Admit: 2015-07-14 | Discharge: 2015-07-14 | Disposition: A | Discharge status: Home / Self Care | Payer: Medicaid Other | Attending: Emergency Medicine | Admitting: Emergency Medicine

## 2015-07-14 ENCOUNTER — Emergency Department (HOSPITAL_BASED_OUTPATIENT_CLINIC_OR_DEPARTMENT_OTHER): Payer: Medicaid Other

## 2015-07-14 DIAGNOSIS — M542 Cervicalgia: Secondary | ICD-10-CM | POA: Diagnosis not present

## 2015-07-14 DIAGNOSIS — Z880 Allergy status to penicillin: Secondary | ICD-10-CM | POA: Insufficient documentation

## 2015-07-14 DIAGNOSIS — Z791 Long term (current) use of non-steroidal anti-inflammatories (NSAID): Secondary | ICD-10-CM | POA: Insufficient documentation

## 2015-07-14 DIAGNOSIS — M545 Low back pain: Secondary | ICD-10-CM | POA: Insufficient documentation

## 2015-07-14 DIAGNOSIS — Z7951 Long term (current) use of inhaled steroids: Secondary | ICD-10-CM | POA: Insufficient documentation

## 2015-07-14 DIAGNOSIS — Z8742 Personal history of other diseases of the female genital tract: Secondary | ICD-10-CM | POA: Diagnosis not present

## 2015-07-14 DIAGNOSIS — E669 Obesity, unspecified: Secondary | ICD-10-CM | POA: Diagnosis not present

## 2015-07-14 DIAGNOSIS — Z91040 Latex allergy status: Secondary | ICD-10-CM | POA: Insufficient documentation

## 2015-07-14 DIAGNOSIS — R079 Chest pain, unspecified: Secondary | ICD-10-CM | POA: Diagnosis present

## 2015-07-14 DIAGNOSIS — Z8701 Personal history of pneumonia (recurrent): Secondary | ICD-10-CM | POA: Diagnosis not present

## 2015-07-14 NOTE — ED Provider Notes (Signed)
CSN: [id number]     Arrival date & time 07/14/15  2120 History  By signing my name below, I, Kimberly [name], attest that this documentation has been prepared under the direction and in the presence of Rolan Bucco, MD. Electronically Signed: Angelene Giovanni, ED Scribe. 07/14/2015. 10:07 PM.    Chief Complaint  Patient presents with  . Chest Pain   The history is provided by the patient. No language interpreter was used.   HPI Comments: Kimberly [name] is a 25 y.o. female who presents to the Emergency Department complaining of gradually worsening moderate constant mid CP onset 2 days ago. She reports associated lower back pain and mild neck pain. She states that the CP is worse when she lays back and when she raises up her arms. She denies that the pain is related to eating. No alleviating factors noted. Pt has not tried any medications for her symptoms. She denies any hx of heart issues. She explains that the last time she had these symptoms she was diagnosed with pneumonia but she is not currently coughing. She denies smoking. She states that she works 3 jobs and sits for long periods of time at one of them. Pt is not currently on birth control. She denies any recent long trips. She also denies any SOB, fever, cold symptoms, leg swelling, or numbness.    Past Medical History  Diagnosis Date  . Ovarian cyst     left side  . Hypertension   . Obesity   . Pregnant   . Pregnancy induced hypertension    Past Surgical History  Procedure Laterality Date  . Tonsillectomy    . Wisdom tooth extraction    . Dilation and curettage of uterus    . Cesarean section  04/09/2011    Procedure: CESAREAN SECTION;  Surgeon: Loney Laurence;  Location: [facility];  Service: Gynecology;  Laterality: N/A;  . Knee arthroscopy    . Cesarean section N/A 07/21/2014    Procedure: CESAREAN SECTION;  Surgeon: Freddrick March. Tenny Craw, MD;  Location: [facility];  Service: Obstetrics;  Laterality: N/A;  . Tubal ligation      Family History  Problem Relation Age of Onset  . Diabetes Mother   . Diabetes Maternal Grandmother   . Diabetes Maternal Grandfather   . Diabetes Paternal Grandmother   . Stroke Paternal Grandmother    Social History  Substance Use Topics  . Smoking status: Never Smoker   . Smokeless tobacco: None  . Alcohol Use: No   OB History    Gravida Para Term Preterm AB TAB SAB Ectopic Multiple Living   0 2     Review of Systems  Constitutional: Negative for fever, chills, diaphoresis and fatigue.  HENT: Negative for congestion, rhinorrhea and sneezing.   Eyes: Negative.   Respiratory: Negative for cough, chest tightness and shortness of breath.   Cardiovascular: Positive for chest pain. Negative for leg swelling.  Gastrointestinal: Negative for nausea, vomiting, abdominal pain, diarrhea and blood in stool.  Genitourinary: Negative for frequency, hematuria, flank pain and difficulty urinating.  Musculoskeletal: Positive for back pain. Negative for arthralgias.  Skin: Negative for rash.  Neurological: Negative for dizziness, speech difficulty, weakness, numbness and headaches.      Allergies  Lemon juice; Orange juice; Citrus; Penicillins; Sulfa antibiotics; Amoxicillin; and Latex  Home Medications   Prior to Admission medications   Medication Sig Start Date End Date Taking? Authorizing Provider  Alum &  Mag Hydroxide-Simeth (MAGIC MOUTHWASH) SOLN Take 5 mLs by mouth 4 (four) times daily as needed for mouth pain. 11/21/14   April Palumbo, MD  azithromycin (ZITHROMAX) 250 MG tablet Take 1 tablet (250 mg total) by mouth daily. Take first 2 tablets together, then 1 every day until finished. 03/04/15   Elwin Mocha, MD  fluticasone [facility]) 50 MCG/ACT nasal spray Place 2 sprays into both nostrils daily. 11/20/14   April Palumbo, MD  ibuprofen (ADVIL,MOTRIN) 800 MG tablet Take 1 tablet (800 mg total) by mouth 3 (three) times daily. 11/20/14   April Palumbo, MD  ibuprofen  (ADVIL,MOTRIN) 800 MG tablet Take 1 tablet (800 mg total) by mouth 3 (three) times daily. 03/04/15   Elwin Mocha, MD   BP 142/73 mmHg  Pulse 88  Temp(Src) 98.4 F (36.9 C) (Oral)  Resp 20  Ht  (1.727 m)  Wt 350 lb (158.759 kg)  BMI 53.23 kg/m2  SpO2 100%  LMP 07/12/2015 Physical Exam  Constitutional: She is oriented to person, place, and time. She appears well-developed and well-nourished.  HENT:  Head: Normocephalic and atraumatic.  Eyes: Pupils are equal, round, and reactive to light.  Neck: Normal range of motion. Neck supple.  Cardiovascular: Normal rate, regular rhythm and normal heart sounds.   Pulmonary/Chest: Effort normal and breath sounds normal. No respiratory distress. She has no wheezes. She has no rales. She exhibits tenderness (Reproducible tenderness across the anterior chest wall).  Abdominal: Soft. Bowel sounds are normal. There is no tenderness. There is no rebound and no guarding.  Musculoskeletal: Normal range of motion. She exhibits no edema.  No edema or calf tenderness  Lymphadenopathy:    She has no cervical adenopathy.  Neurological: She is alert and oriented to person, place, and time.  Skin: Skin is warm and dry. No rash noted.  Psychiatric: She has a normal mood and affect.    ED Course  Procedures (including critical care time) DIAGNOSTIC STUDIES: Oxygen Saturation is 100% on RA, normal by my interpretation.    COORDINATION OF CARE: 9:56 PM- Pt advised of plan for treatment and pt agrees. Pt will receive a chest x-ray for further evaluation.  Imaging Review Dg Chest 2 View  07/14/2015  CLINICAL DATA:  Chest pain for 2 days.  Central chest pressure. EXAM: CHEST  2 VIEW COMPARISON:  11/21/2014 FINDINGS: The heart size and mediastinal contours are within normal limits. Both lungs are clear. The visualized skeletal structures are unremarkable. IMPRESSION: No active cardiopulmonary disease. Electronically Signed   By: Burman Nieves M.D.   On:  07/14/2015 22:18     Rolan Bucco, MD has personally reviewed and evaluated these images as part of her medical decision-making.   EKG Interpretation   Date/Time:  Friday July 14 2015 21:31:58 EST Ventricular Rate:  79 PR Interval:  140 QRS Duration: 80 QT Interval:  352 QTC Calculation: 403 R Axis:   59 Text Interpretation:  Normal sinus rhythm Normal ECG since last tracing no  significant change Confirmed by Gennaro Lizotte  MD, Nickey Canedo ([id number]) on 07/14/2015  9:45:53 PM      MDM   Final diagnoses:  Chest pain, unspecified chest pain type    Patient presents with chest pain. Her chest x-ray is negative for pneumonia, pneumothorax or other abnormality. Her EKG does not show any evidence of ischemia. Her chest pain is reproducible and I feel it is likely musculoskeletal. She doesn't have any other suggestions of pulmonary embolus. She was discharged home in good condition.  She was advised in symptomatic care. Return precautions were given.  I personally performed the services described in this documentation, which was scribed in my presence.  The recorded information has been reviewed and considered.   Rolan Bucco, MD 07/14/15 2231

## 2015-07-14 NOTE — ED Notes (Signed)
Pt given d/c instructions. Verbalizes understanding. No questions. 

## 2015-07-14 NOTE — ED Notes (Signed)
Chest pain x 2 days. Pressure in the center of her chest.

## 2015-07-14 NOTE — Discharge Instructions (Signed)

## 2015-09-06 ENCOUNTER — Emergency Department (HOSPITAL_BASED_OUTPATIENT_CLINIC_OR_DEPARTMENT_OTHER): Payer: Medicaid Other

## 2015-09-06 ENCOUNTER — Emergency Department (HOSPITAL_BASED_OUTPATIENT_CLINIC_OR_DEPARTMENT_OTHER)
Admission: EM | Admit: 2015-09-06 | Discharge: 2015-09-06 | Disposition: A | Discharge status: Home / Self Care | Payer: Medicaid Other | Attending: Emergency Medicine | Admitting: Emergency Medicine

## 2015-09-06 ENCOUNTER — Encounter (HOSPITAL_BASED_OUTPATIENT_CLINIC_OR_DEPARTMENT_OTHER): Payer: Self-pay | Admitting: Emergency Medicine

## 2015-09-06 DIAGNOSIS — Z880 Allergy status to penicillin: Secondary | ICD-10-CM | POA: Insufficient documentation

## 2015-09-06 DIAGNOSIS — I10 Essential (primary) hypertension: Secondary | ICD-10-CM | POA: Insufficient documentation

## 2015-09-06 DIAGNOSIS — E669 Obesity, unspecified: Secondary | ICD-10-CM | POA: Insufficient documentation

## 2015-09-06 DIAGNOSIS — Z91040 Latex allergy status: Secondary | ICD-10-CM | POA: Diagnosis not present

## 2015-09-06 DIAGNOSIS — Z3202 Encounter for pregnancy test, result negative: Secondary | ICD-10-CM | POA: Insufficient documentation

## 2015-09-06 DIAGNOSIS — R1011 Right upper quadrant pain: Secondary | ICD-10-CM | POA: Diagnosis present

## 2015-09-06 DIAGNOSIS — N938 Other specified abnormal uterine and vaginal bleeding: Secondary | ICD-10-CM | POA: Insufficient documentation

## 2015-09-06 DIAGNOSIS — E876 Hypokalemia: Secondary | ICD-10-CM | POA: Diagnosis not present

## 2015-09-06 DIAGNOSIS — Z79899 Other long term (current) drug therapy: Secondary | ICD-10-CM | POA: Diagnosis not present

## 2015-09-06 DIAGNOSIS — Z7951 Long term (current) use of inhaled steroids: Secondary | ICD-10-CM | POA: Diagnosis not present

## 2015-09-06 DIAGNOSIS — N390 Urinary tract infection, site not specified: Secondary | ICD-10-CM

## 2015-09-06 DIAGNOSIS — Z8742 Personal history of other diseases of the female genital tract: Secondary | ICD-10-CM | POA: Diagnosis not present

## 2015-09-06 LAB — CBC
HCT: 32.4 % — ABNORMAL LOW (ref 36.0–46.0)
Hemoglobin: 10.4 g/dL — ABNORMAL LOW (ref 12.0–15.0)
MCH: 21.2 pg — ABNORMAL LOW (ref 26.0–34.0)
MCHC: 32.1 g/dL (ref 30.0–36.0)
MCV: 66.1 fL — ABNORMAL LOW (ref 78.0–100.0)
PLATELETS: 313 10*3/uL (ref 150–400)
RBC: 4.9 MIL/uL (ref 3.87–5.11)
RDW: 16.5 % — AB (ref 11.5–15.5)
WBC: 5.9 10*3/uL (ref 4.0–10.5)

## 2015-09-06 LAB — URINE MICROSCOPIC-ADD ON

## 2015-09-06 LAB — URINALYSIS, ROUTINE W REFLEX MICROSCOPIC
Bilirubin Urine: NEGATIVE
GLUCOSE, UA: NEGATIVE mg/dL
Ketones, ur: NEGATIVE mg/dL
Nitrite: POSITIVE — AB
PROTEIN: 30 mg/dL — AB
Specific Gravity, Urine: 1.021 (ref 1.005–1.030)
pH: 6 (ref 5.0–8.0)

## 2015-09-06 LAB — COMPREHENSIVE METABOLIC PANEL
ALT: 13 U/L — AB (ref 14–54)
AST: 15 U/L (ref 15–41)
Albumin: 4.1 g/dL (ref 3.5–5.0)
Alkaline Phosphatase: 87 U/L (ref 38–126)
Anion gap: 9 (ref 5–15)
BILIRUBIN TOTAL: 0.7 mg/dL (ref 0.3–1.2)
BUN: 6 mg/dL (ref 6–20)
CALCIUM: 8.3 mg/dL — AB (ref 8.9–10.3)
CO2: 25 mmol/L (ref 22–32)
CREATININE: 0.57 mg/dL (ref 0.44–1.00)
Chloride: 100 mmol/L — ABNORMAL LOW (ref 101–111)
GFR calc Af Amer: >60 mL/min (ref >60)
Glucose, Bld: 95 mg/dL (ref 65–99)
Potassium: 3 mmol/L — ABNORMAL LOW (ref 3.5–5.1)
Sodium: 134 mmol/L — ABNORMAL LOW (ref 135–145)
TOTAL PROTEIN: 8.2 g/dL — AB (ref 6.5–8.1)

## 2015-09-06 LAB — PREGNANCY, URINE: PREG TEST UR: NEGATIVE

## 2015-09-06 LAB — LIPASE, BLOOD: Lipase: 18 U/L (ref 11–51)

## 2015-09-06 MED ORDER — MORPHINE SULFATE (PF) 4 MG/ML IV SOLN
4.0000 mg | Freq: Once | INTRAVENOUS | Status: AC
  Administered 2015-09-06: 4 mg via INTRAVENOUS
  Filled 2015-09-06: qty 1

## 2015-09-06 MED ORDER — ONDANSETRON HCL 4 MG/2ML IJ SOLN
4.0000 mg | Freq: Once | INTRAMUSCULAR | Status: AC
  Administered 2015-09-06: 4 mg via INTRAVENOUS
  Filled 2015-09-06: qty 2

## 2015-09-06 MED ORDER — POTASSIUM CHLORIDE CRYS ER 20 MEQ PO TBCR
40.0000 meq | EXTENDED_RELEASE_TABLET | Freq: Once | ORAL | Status: AC
  Administered 2015-09-06: 40 meq via ORAL
  Filled 2015-09-06: qty 2

## 2015-09-06 MED ORDER — NITROFURANTOIN MONOHYD MACRO 100 MG PO CAPS: 100.0000 mg | ORAL_CAPSULE | Freq: Two times a day (BID) | ORAL | Status: DC

## 2015-09-06 MED ORDER — SODIUM CHLORIDE 0.9 % IV BOLUS (SEPSIS)
1000.0000 mL | Freq: Once | INTRAVENOUS | Status: AC
  Administered 2015-09-06: 1000 mL via INTRAVENOUS

## 2015-09-06 MED ORDER — TRAMADOL HCL 50 MG PO TABS: 50.0000 mg | ORAL_TABLET | Freq: Four times a day (QID) | ORAL | Status: DC | PRN

## 2015-09-06 MED ORDER — IOPAMIDOL (ISOVUE-300) INJECTION 61%
100.0000 mL | Freq: Once | INTRAVENOUS | Status: AC | PRN
  Administered 2015-09-06: 100 mL via INTRAVENOUS

## 2015-09-06 NOTE — Discharge Instructions (Signed)
Abdominal Pain, Adult °Many things can cause abdominal pain. Usually, abdominal pain is not caused by a disease and will improve without treatment. It can often be observed and treated at home. Your health care provider will do a physical exam and possibly order blood tests and X-rays to help determine the seriousness of your pain. However, in many cases, more time must pass before a clear cause of the pain can be found. Before that point, your health care provider may not know if you need more testing or further treatment. °HOME CARE INSTRUCTIONS °Monitor your abdominal pain for any changes. The following actions may help to alleviate any discomfort you are experiencing: °· Only take over-the-counter or prescription medicines as directed by your health care provider. °· Do not take laxatives unless directed to do so by your health care provider. °· Try a clear liquid diet (broth, tea, or water) as directed by your health care provider. Slowly move to a bland diet as tolerated. °SEEK MEDICAL CARE IF: °· You have unexplained abdominal pain. °· You have abdominal pain associated with nausea or diarrhea. °· You have pain when you urinate or have a bowel movement. °· You experience abdominal pain that wakes you in the night. °· You have abdominal pain that is worsened or improved by eating food. °· You have abdominal pain that is worsened with eating fatty foods. °· You have a fever. °SEEK IMMEDIATE MEDICAL CARE IF: °· Your pain does not go away within 2 hours. °· You keep throwing up (vomiting). °· Your pain is felt only in portions of the abdomen, such as the right side or the left lower portion of the abdomen. °· You pass bloody or black tarry stools. °MAKE SURE YOU: °· Understand these instructions. °· Will watch your condition. °· Will get help right away if you are not doing well or get worse. °  °This information is not intended to replace advice given to you by your health care provider. Make sure you discuss  any questions you have with your health care provider. °  °Document Released: 03/13/2005 Document Revised: 02/22/2015 Document Reviewed: 02/10/2013 °Elsevier Interactive Patient Education ©2016 Elsevier Inc. ° °Hypokalemia °Hypokalemia means that the amount of potassium in the blood is lower than normal. Potassium is a chemical, called an electrolyte, that helps regulate the amount of fluid in the body. It also stimulates muscle contraction and helps nerves function properly. Most of the body's potassium is inside of cells, and only a very small amount is in the blood. Because the amount in the blood is so small, minor changes can be life-threatening. °CAUSES °· Antibiotics. °· Diarrhea or vomiting. °· Using laxatives too much, which can cause diarrhea. °· Chronic kidney disease. °· Water pills (diuretics). °· Eating disorders (bulimia). °· Low magnesium level. °· Sweating a lot. °SIGNS AND SYMPTOMS °· Weakness. °· Constipation. °· Fatigue. °· Muscle cramps. °· Mental confusion. °· Skipped heartbeats or irregular heartbeat (palpitations). °· Tingling or numbness. °DIAGNOSIS  °Your health care provider can diagnose hypokalemia with blood tests. In addition to checking your potassium level, your health care provider may also check other lab tests. °TREATMENT °Hypokalemia can be treated with potassium supplements taken by mouth or adjustments in your current medicines. If your potassium level is very low, you may need to get potassium through a vein (IV) and be monitored in the hospital. A diet high in potassium is also helpful. Foods high in potassium are: °· Nuts, such as peanuts and pistachios. °· Seeds, such as   sunflower seeds and pumpkin seeds.  Peas, lentils, and lima beans.  Whole grain and bran cereals and breads.  Fresh fruit and vegetables, such as apricots, avocado, bananas, cantaloupe, kiwi, oranges, tomatoes, asparagus, and potatoes.  Orange and tomato juices.  Red meats.  Fruit yogurt. HOME  CARE INSTRUCTIONS  Take all medicines as prescribed by your health care provider.  Maintain a healthy diet by including nutritious food, such as fruits, vegetables, nuts, whole grains, and lean meats.  If you are taking a laxative, be sure to follow the directions on the label. SEEK MEDICAL CARE IF:  Your weakness gets worse.  You feel your heart pounding or racing.  You are vomiting or having diarrhea.  You are diabetic and having trouble keeping your blood glucose in the normal range. SEEK IMMEDIATE MEDICAL CARE IF:  You have chest pain, shortness of breath, or dizziness.  You are vomiting or having diarrhea for more than 2 days.  You faint. MAKE SURE YOU:   Understand these instructions.  Will watch your condition.  Will get help right away if you are not doing well or get worse.   This information is not intended to replace advice given to you by your health care provider. Make sure you discuss any questions you have with your health care provider.   Document Released: 06/03/2005 Document Revised: 06/24/2014 Document Reviewed: 12/04/2012 Elsevier Interactive Patient Education 2016 Elsevier Inc.  Urinary Tract Infection Urinary tract infections (UTIs) can develop anywhere along your urinary tract. Your urinary tract is your body's drainage system for removing wastes and extra water. Your urinary tract includes two kidneys, two ureters, a bladder, and a urethra. Your kidneys are a pair of bean-shaped organs. Each kidney is about the size of your fist. They are located below your ribs, one on each side of your spine. CAUSES Infections are caused by microbes, which are microscopic organisms, including fungi, viruses, and bacteria. These organisms are so small that they can only be seen through a microscope. Bacteria are the microbes that most commonly cause UTIs. SYMPTOMS  Symptoms of UTIs may vary by age and gender of the patient and by the location of the infection.  Symptoms in young women typically include a frequent and intense urge to urinate and a painful, burning feeling in the bladder or urethra during urination. Older women and men are more likely to be tired, shaky, and weak and have muscle aches and abdominal pain. A fever may mean the infection is in your kidneys. Other symptoms of a kidney infection include pain in your back or sides below the ribs, nausea, and vomiting. DIAGNOSIS To diagnose a UTI, your caregiver will ask you about your symptoms. Your caregiver will also ask you to provide a urine sample. The urine sample will be tested for bacteria and white blood cells. White blood cells are made by your body to help fight infection. TREATMENT  Typically, UTIs can be treated with medication. Because most UTIs are caused by a bacterial infection, they usually can be treated with the use of antibiotics. The choice of antibiotic and length of treatment depend on your symptoms and the type of bacteria causing your infection. HOME CARE INSTRUCTIONS  If you were prescribed antibiotics, take them exactly as your caregiver instructs you. Finish the medication even if you feel better after you have only taken some of the medication.  Drink enough water and fluids to keep your urine clear or pale yellow.  Avoid caffeine, tea, and carbonated beverages.  They tend to irritate your bladder.  Empty your bladder often. Avoid holding urine for long periods of time.  Empty your bladder before and after sexual intercourse.  After a bowel movement, women should cleanse from front to back. Use each tissue only once. SEEK MEDICAL CARE IF:   You have back pain.  You develop a fever.  Your symptoms do not begin to resolve within 3 days. SEEK IMMEDIATE MEDICAL CARE IF:   You have severe back pain or lower abdominal pain.  You develop chills.  You have nausea or vomiting.  You have continued burning or discomfort with urination. MAKE SURE YOU:    Understand these instructions.  Will watch your condition.  Will get help right away if you are not doing well or get worse.   This information is not intended to replace advice given to you by your health care provider. Make sure you discuss any questions you have with your health care provider.   Document Released: 03/13/2005 Document Revised: 02/22/2015 Document Reviewed: 07/12/2011 Elsevier Interactive Patient Education [employer]2016 [employer] Inc.

## 2015-09-06 NOTE — ED Notes (Signed)
Patient asked to change into a gown.  

## 2015-09-06 NOTE — ED Notes (Signed)
Patient ambulates to radiology department. 

## 2015-09-06 NOTE — ED Provider Notes (Signed)
CSN: [id number]     Arrival date & time 09/06/15  1321 History   First MD Initiated Contact with Patient 09/06/15 [phone]     Chief Complaint  Patient presents with  . Abdominal Pain     (Consider location/radiation/quality/duration/timing/severity/associated sxs/prior Treatment) HPI Patient presents with right upper quadrant abdominal pain radiating to her right flank starting yesterday afternoon around 2 PM. States she ate [employer] around 10 AM. Pain has been constant and associated with nausea and several episodes of vomiting. Admits to subjective fevers and chills. Denies previous symptoms. No previous abdominal surgeries other than cesarean section. No sick contacts. Denies.diarrhea or constipation. Patient's currently on her menstrual cycle. This is day 2. Denies pelvic pain, dysuria, frequency or urgency. Past Medical History  Diagnosis Date  . Ovarian cyst     left side  . Hypertension   . Obesity   . Pregnant   . Pregnancy induced hypertension    Past Surgical History  Procedure Laterality Date  . Tonsillectomy    . Wisdom tooth extraction    . Dilation and curettage of uterus    . Cesarean section  04/09/2011    Procedure: CESAREAN SECTION;  Surgeon: Loney LaurenceMichelle A Horvath;  Location: [facility];  Service: Gynecology;  Laterality: N/A;  . Knee arthroscopy    . Cesarean section N/A 07/21/2014    Procedure: CESAREAN SECTION;  Surgeon: Freddrick MarchKendra H. Tenny Crawoss, MD;  Location: [facility];  Service: Obstetrics;  Laterality: N/A;  . Tubal ligation     Family History  Problem Relation Age of Onset  . Diabetes Mother   . Diabetes Maternal Grandmother   . Diabetes Maternal Grandfather   . Diabetes Paternal Grandmother   . Stroke Paternal Grandmother    Social History  Substance Use Topics  . Smoking status: Never Smoker   . Smokeless tobacco: None  . Alcohol Use: No   OB History    Gravida Para Term Preterm AB TAB SAB Ectopic Multiple Living   5 2 1 1 3 1 1 1  0 2     Review of Systems   Constitutional: Positive for fever and chills.  HENT: Negative for congestion.   Respiratory: Negative for cough and shortness of breath.   Cardiovascular: Negative for chest pain.  Gastrointestinal: Positive for nausea, vomiting and abdominal pain. Negative for diarrhea and constipation.  Genitourinary: Positive for flank pain and vaginal bleeding. Negative for dysuria, frequency, hematuria and pelvic pain.  Musculoskeletal: Positive for back pain. Negative for myalgias, neck pain and neck stiffness.  Skin: Negative for rash and wound.  Neurological: Negative for dizziness, weakness, light-headedness, numbness and headaches.  All other systems reviewed and are negative.     Allergies  Lemon juice; Orange juice; Citrus; Penicillins; Sulfa antibiotics; Amoxicillin; and Latex  Home Medications   Prior to Admission medications   Medication Sig Start Date End Date Taking? Authorizing Provider  Alum & Mag Hydroxide-Simeth (MAGIC MOUTHWASH) SOLN Take 5 mLs by mouth 4 (four) times daily as needed for mouth pain. 11/21/14   April Palumbo, MD  azithromycin (ZITHROMAX) 250 MG tablet Take 1 tablet (250 mg total) by mouth daily. Take first 2 tablets together, then 1 every day until finished. 03/04/15   Elwin MochaBlair Walden, MD  fluticasone [facility](FLONASE) 50 MCG/ACT nasal spray Place 2 sprays into both nostrils daily. 11/20/14   April Palumbo, MD  ibuprofen (ADVIL,MOTRIN) 800 MG tablet Take 1 tablet (800 mg total) by mouth 3 (three) times daily. 11/20/14   April Palumbo, MD  ibuprofen (  ADVIL,MOTRIN) 800 MG tablet Take 1 tablet (800 mg total) by mouth 3 (three) times daily. 03/04/15   Elwin Mocha, MD  nitrofurantoin, macrocrystal-monohydrate, (MACROBID) 100 MG capsule Take 1 capsule (100 mg total) by mouth 2 (two) times daily. 09/06/15   Loren Racer, MD  traMADol (ULTRAM) 50 MG tablet Take 1 tablet (50 mg total) by mouth every 6 (six) hours as needed. 09/06/15   Loren Racer, MD   BP 162/89 mmHg  Pulse 67   Temp(Src) 98.9 F (37.2 C) (Oral)  Resp 18  Ht  (1.727 m)  Wt 353 lb (160.12 kg)  BMI 53.69 kg/m2  SpO2 99%  LMP 09/04/2015 Physical Exam  Constitutional: She is oriented to person, place, and time. She appears well-developed and well-nourished. No distress.  HENT:  Head: Normocephalic and atraumatic.  Mouth/Throat: Oropharynx is clear and moist. No oropharyngeal exudate.  Eyes: EOM are normal. Pupils are equal, round, and reactive to light.  Neck: Normal range of motion. Neck supple.  Cardiovascular: Normal rate and regular rhythm.   Pulmonary/Chest: Effort normal and breath sounds normal. No respiratory distress. She has no wheezes. She has no rales. She exhibits no tenderness.  Abdominal: Soft. Bowel sounds are normal. She exhibits no distension and no mass. There is tenderness (Tenderness to palpation in the right upper quadrant. Mild diffuse generalized tenderness.). There is no rebound and no guarding.  Musculoskeletal: Normal range of motion. She exhibits tenderness. She exhibits no edema.  Mild right CVA tenderness with percussion. No midline thoracic or lumbar tenderness. No lower extremity asymmetry, swelling or tenderness.  Neurological: She is alert and oriented to person, place, and time.  Moves all extremities without deficit. Sensation is fully intact.  Skin: Skin is warm and dry. No rash noted. No erythema.  Psychiatric: She has a normal mood and affect. Her behavior is normal.  Nursing note and vitals reviewed.   ED Course  Procedures (including critical care time) Labs Review Labs Reviewed  URINALYSIS, ROUTINE W REFLEX MICROSCOPIC (NOT AT [facility]) - Abnormal; Notable for the following:    Color, Urine AMBER (*)    APPearance CLOUDY (*)    Hgb urine dipstick LARGE (*)    Protein, ur 30 (*)    Nitrite POSITIVE (*)    Leukocytes, UA SMALL (*)    All other components within normal limits  URINE MICROSCOPIC-ADD ON - Abnormal; Notable for the following:     Squamous Epithelial / LPF 0-5 (*)    Bacteria, UA MANY (*)    All other components within normal limits  COMPREHENSIVE METABOLIC PANEL - Abnormal; Notable for the following:    Sodium 134 (*)    Potassium 3.0 (*)    Chloride 100 (*)    Calcium 8.3 (*)    Total Protein 8.2 (*)    ALT 13 (*)    All other components within normal limits  CBC - Abnormal; Notable for the following:    Hemoglobin 10.4 (*)    HCT 32.4 (*)    MCV 66.1 (*)    MCH 21.2 (*)    RDW 16.5 (*)    All other components within normal limits  PREGNANCY, URINE  LIPASE, BLOOD    Imaging Review US Abdomen Complete  09/06/2015  CLINICAL DATA:  Right upper quadrant pain for 1 day, initial encounter EXAM: ABDOMEN ULTRASOUND COMPLETE COMPARISON:  None. FINDINGS: Gallbladder: No gallstones or wall thickening visualized. No sonographic Murphy sign noted by sonographer. Common bile duct: Diameter: 3 mm Liver:  No focal lesion identified. Within normal limits in parenchymal echogenicity. IVC: Not well visualized Pancreas: Not well visualized Spleen: Size and appearance within normal limits. Right Kidney: Length: 11.4 cm. Echogenicity within normal limits. No mass or hydronephrosis visualized. Left Kidney: Length: Un 0.3 cm. Echogenicity within normal limits. No mass or hydronephrosis visualized. Abdominal aorta: No aneurysm visualized. Other findings: None. IMPRESSION: Somewhat limited exam due to overlying bowel gas and body habitus. No acute abnormality is noted. Electronically Signed   By: Alcide Clever M.D.   On: 09/06/2015 16:40   Ct Abdomen Pelvis W Contrast  09/06/2015  CLINICAL DATA:  Right lower quadrant pain with nausea and vomiting for 1 day EXAM: CT ABDOMEN AND PELVIS WITH CONTRAST TECHNIQUE: Multidetector CT imaging of the abdomen and pelvis was performed using the standard protocol following bolus administration of intravenous contrast. Oral contrast was also administered. CONTRAST:  ISOVUE-300 IOPAMIDOL (ISOVUE-300)  INJECTION 61% COMPARISON:  None. FINDINGS: Lower chest: There is slight left base atelectasis. Lung bases otherwise are clear. Hepatobiliary: No focal liver lesions are identified. Gallbladder wall is not appreciably thickened. There is no biliary duct dilatation. Pancreas: No pancreatic mass or inflammatory focus. Spleen: No splenic lesions are identified. Adrenals/Urinary Tract: Adrenals appear normal bilaterally. Kidneys bilaterally show no hydronephrosis on either side. There is a 6 mm probable cyst in the mid left kidney. There is a tiny calculus in the mid to lower pole left kidney. There is no other intrarenal calculus on either side. There is no ureteral calculus on either side. Urinary bladder is midline with wall thickness within normal limits. Stomach/Bowel: There is no bowel wall or mesenteric thickening. No bowel obstruction. No free air or portal venous air. Vascular/Lymphatic: There is no abdominal aortic aneurysm. No vascular lesions are evident on this study. By size criteria, there is no adenopathy in the abdomen or pelvis. There are scattered subcentimeter lymph nodes in the mid abdomen as well as in the right lower quadrant region. Several small lymph nodes are near the appendix in the right lower quadrant. Reproductive: Uterus is anteverted. There are tubal ligation clips bilaterally. There is no pelvic mass or pelvic fluid collection. Other: Appendix is diminutive without right lower quadrant inflammation. No abscess or ascites is evident in the abdomen or pelvis. There is a small ventral hernia containing only fat. Musculoskeletal: There are no blastic or lytic bone lesions. There is no intramuscular or abdominal wall lesion. IMPRESSION: There are scattered subcentimeter lymph nodes in the mid abdomen and right lower quadrant regions. Question a degree of mesenteric adenitis. By size criteria, there is no adenopathy in the abdomen or pelvis. Appendix appears unremarkable.  No bowel  obstruction.  No abscess. No hydronephrosis or ureteral calculus on either side. There is a tiny left renal calculus. Electronically Signed   By: Bretta Bang III M.D.   On: 09/06/2015 19:22   I have personally reviewed and evaluated these images and lab results as part of my medical decision-making.   EKG Interpretation None      MDM   Final diagnoses:  Right upper quadrant pain  Hypokalemia  UTI (lower urinary tract infection)    Patient is more comfortable. Nitrite positive UA with many bacteria. Patient also has a small left sided renal stone. Symptoms likely related to UTI though patient could have passed a small renal stone. She is more comfortable. We'll discharge home with antibiotics and pain control. Return precautions given.    Loren Racer, MD 09/06/15 2251

## 2015-09-06 NOTE — ED Notes (Signed)
Patient reports that she is having pain to her right side with N/V  - the patient is shaking back and forth in the triage chair

## 2015-09-06 NOTE — ED Notes (Signed)
Patient transported to Ultrasound 

## 2015-09-09 ENCOUNTER — Emergency Department (HOSPITAL_BASED_OUTPATIENT_CLINIC_OR_DEPARTMENT_OTHER)
Admission: EM | Admit: 2015-09-09 | Discharge: 2015-09-09 | Disposition: A | Discharge status: Home / Self Care | Payer: Medicaid Other | Attending: Emergency Medicine | Admitting: Emergency Medicine

## 2015-09-09 ENCOUNTER — Encounter (HOSPITAL_BASED_OUTPATIENT_CLINIC_OR_DEPARTMENT_OTHER): Payer: Self-pay | Admitting: Emergency Medicine

## 2015-09-09 DIAGNOSIS — Z880 Allergy status to penicillin: Secondary | ICD-10-CM | POA: Insufficient documentation

## 2015-09-09 DIAGNOSIS — I10 Essential (primary) hypertension: Secondary | ICD-10-CM | POA: Diagnosis not present

## 2015-09-09 DIAGNOSIS — E119 Type 2 diabetes mellitus without complications: Secondary | ICD-10-CM | POA: Diagnosis not present

## 2015-09-09 DIAGNOSIS — Z91040 Latex allergy status: Secondary | ICD-10-CM | POA: Insufficient documentation

## 2015-09-09 DIAGNOSIS — Z8742 Personal history of other diseases of the female genital tract: Secondary | ICD-10-CM | POA: Diagnosis not present

## 2015-09-09 DIAGNOSIS — Z7951 Long term (current) use of inhaled steroids: Secondary | ICD-10-CM | POA: Insufficient documentation

## 2015-09-09 DIAGNOSIS — Z791 Long term (current) use of non-steroidal anti-inflammatories (NSAID): Secondary | ICD-10-CM | POA: Diagnosis not present

## 2015-09-09 DIAGNOSIS — R1031 Right lower quadrant pain: Secondary | ICD-10-CM | POA: Insufficient documentation

## 2015-09-09 LAB — CBC WITH DIFFERENTIAL/PLATELET
BASOS PCT: 0 %
Basophils Absolute: 0 10*3/uL (ref 0.0–0.1)
EOS PCT: 6 %
Eosinophils Absolute: 0.4 10*3/uL (ref 0.0–0.7)
HCT: 34.8 % — ABNORMAL LOW (ref 36.0–46.0)
HEMOGLOBIN: 11.1 g/dL — AB (ref 12.0–15.0)
Lymphocytes Relative: 36 %
Lymphs Abs: 2.1 10*3/uL (ref 0.7–4.0)
MCH: 21 pg — AB (ref 26.0–34.0)
MCHC: 31.9 g/dL (ref 30.0–36.0)
MCV: 65.8 fL — AB (ref 78.0–100.0)
MONO ABS: 0.4 10*3/uL (ref 0.1–1.0)
MONOS PCT: 7 %
NEUTROS ABS: 2.9 10*3/uL (ref 1.7–7.7)
Neutrophils Relative %: 51 %
PLATELETS: ADEQUATE 10*3/uL (ref 150–400)
RBC: 5.29 MIL/uL — ABNORMAL HIGH (ref 3.87–5.11)
RDW: 16.4 % — ABNORMAL HIGH (ref 11.5–15.5)
WBC: 5.8 10*3/uL (ref 4.0–10.5)

## 2015-09-09 LAB — COMPREHENSIVE METABOLIC PANEL
ALT: 15 U/L (ref 14–54)
ANION GAP: 8 (ref 5–15)
AST: 17 U/L (ref 15–41)
Albumin: 3.5 g/dL (ref 3.5–5.0)
Alkaline Phosphatase: 67 U/L (ref 38–126)
BUN: 9 mg/dL (ref 6–20)
CHLORIDE: 108 mmol/L (ref 101–111)
CO2: 23 mmol/L (ref 22–32)
Calcium: 8.3 mg/dL — ABNORMAL LOW (ref 8.9–10.3)
Creatinine, Ser: 0.5 mg/dL (ref 0.44–1.00)
GFR calc non Af Amer: >60 mL/min (ref >60)
Glucose, Bld: 89 mg/dL (ref 65–99)
Potassium: 3.9 mmol/L (ref 3.5–5.1)
SODIUM: 139 mmol/L (ref 135–145)
Total Bilirubin: 0.5 mg/dL (ref 0.3–1.2)
Total Protein: 7 g/dL (ref 6.5–8.1)

## 2015-09-09 LAB — URINALYSIS, ROUTINE W REFLEX MICROSCOPIC
BILIRUBIN URINE: NEGATIVE
GLUCOSE, UA: NEGATIVE mg/dL
HGB URINE DIPSTICK: NEGATIVE
KETONES UR: NEGATIVE mg/dL
LEUKOCYTES UA: NEGATIVE
Nitrite: NEGATIVE
PH: 7 (ref 5.0–8.0)
Protein, ur: NEGATIVE mg/dL
Specific Gravity, Urine: 1.02 (ref 1.005–1.030)

## 2015-09-09 LAB — LIPASE, BLOOD: LIPASE: 17 U/L (ref 11–51)

## 2015-09-09 MED ORDER — KETOROLAC TROMETHAMINE 30 MG/ML IJ SOLN
30.0000 mg | Freq: Once | INTRAMUSCULAR | Status: AC
  Administered 2015-09-09: 30 mg via INTRAVENOUS
  Filled 2015-09-09: qty 1

## 2015-09-09 MED ORDER — SODIUM CHLORIDE 0.9 % IV BOLUS (SEPSIS)
1000.0000 mL | Freq: Once | INTRAVENOUS | Status: AC
  Administered 2015-09-09: 1000 mL via INTRAVENOUS

## 2015-09-09 NOTE — Discharge Instructions (Signed)
Please read and follow all provided instructions.  Your diagnoses today include:  1. Right lower quadrant abdominal pain     Medications prescribed:   None  You can use Ibuprofen 400mg  combined with Tylenol 1000mg  for pain relief every 6 hours. Do not exceed 4g of Tylenol in one 24 hour period.   Home care instructions:  Follow any educational materials contained in this packet.  Follow-up instructions: Please follow-up with your primary care provider in the next week further evaluation of symptoms and treatment   Return instructions:   Please return to the Emergency Department if you do not get better, if you get worse, or new symptoms OR  - Fever (temperature greater than 101.33F)  - Bleeding that does not stop with holding pressure to the area    -Severe pain (please note that you may be more sore the day after your accident)  - Chest Pain  - Difficulty breathing  - Severe nausea or vomiting  - Inability to tolerate food and liquids  - Passing out  - Skin becoming red around your wounds  - Change in mental status (confusion or lethargy)  - New numbness or weakness     Please return if you have any other emergent concerns.

## 2015-09-09 NOTE — ED Provider Notes (Signed)
CSN: [id number]     Arrival date & time 09/09/15  1115 History   First MD Initiated Contact with Patient 09/09/15 1138     Chief Complaint  Patient presents with  . Abdominal Pain   (Consider location/radiation/quality/duration/timing/severity/associated sxs/prior Treatment) HPI 25 y.o. female with a hx of Ovarian Cysts, HTN, Obesity, presents to the Emergency Department today complaining of RLQ abdominal pain since previous visit.  Seen in ED on Thursday (3/22) for similar complaint. CT abdomen/pelvis obtained. No acute abnormalities noted. Diagnosed with UTI as well as possible Lymph irritation. Pt presents with continued pain despite Tramadol and Macrobid. Notes pain is dull ache that is 7/10. Radiates to right flank. Symptoms present when she is active. No pain at rest. Notes diarrhea as well. No N/V. No fevers. Last menstrual was from 09-06-15 to 09-09-15. No other symptoms noted.   Past Medical History  Diagnosis Date  . Ovarian cyst     left side  . Hypertension   . Obesity   . Pregnant   . Pregnancy induced hypertension    Past Surgical History  Procedure Laterality Date  . Tonsillectomy    . Wisdom tooth extraction    . Dilation and curettage of uterus    . Cesarean section  04/09/2011    Procedure: CESAREAN SECTION;  Surgeon: Loney Laurence;  Location: [facility];  Service: Gynecology;  Laterality: N/A;  . Knee arthroscopy    . Cesarean section N/A 07/21/2014    Procedure: CESAREAN SECTION;  Surgeon: Freddrick March. Tenny Craw, MD;  Location: [facility];  Service: Obstetrics;  Laterality: N/A;  . Tubal ligation     Family History  Problem Relation Age of Onset  . Diabetes Mother   . Diabetes Maternal Grandmother   . Diabetes Maternal Grandfather   . Diabetes Paternal Grandmother   . Stroke Paternal Grandmother    Social History  Substance Use Topics  . Smoking status: Never Smoker   . Smokeless tobacco: None  . Alcohol Use: No   OB History    Gravida Para Term Preterm AB TAB SAB  Ectopic Multiple Living   0 2     Review of Systems ROS reviewed and all are negative for acute change except as noted in the HPI.  Allergies  Lemon juice; Orange juice; Citrus; Penicillins; Sulfa antibiotics; Amoxicillin; and Latex  Home Medications   Prior to Admission medications   Medication Sig Start Date End Date Taking? Authorizing Provider  Alum & Mag Hydroxide-Simeth (MAGIC MOUTHWASH) SOLN Take 5 mLs by mouth 4 (four) times daily as needed for mouth pain. 11/21/14   April Palumbo, MD  azithromycin (ZITHROMAX) 250 MG tablet Take 1 tablet (250 mg total) by mouth daily. Take first 2 tablets together, then 1 every day until finished. 03/04/15   Elwin Mocha, MD  fluticasone [facility]) 50 MCG/ACT nasal spray Place 2 sprays into both nostrils daily. 11/20/14   April Palumbo, MD  ibuprofen (ADVIL,MOTRIN) 800 MG tablet Take 1 tablet (800 mg total) by mouth 3 (three) times daily. 11/20/14   April Palumbo, MD  ibuprofen (ADVIL,MOTRIN) 800 MG tablet Take 1 tablet (800 mg total) by mouth 3 (three) times daily. 03/04/15   Elwin Mocha, MD  nitrofurantoin, macrocrystal-monohydrate, (MACROBID) 100 MG capsule Take 1 capsule (100 mg total) by mouth 2 (two) times daily. 09/06/15   Loren Racer, MD  traMADol (ULTRAM) 50 MG tablet Take 1 tablet (50 mg total) by mouth every 6 (six) hours  as needed. 09/06/15   Loren Raceravid Yelverton, MD   LMP 09/04/2015 (Exact Date)   Physical Exam  Constitutional: She is oriented to person, place, and time. She appears well-developed and well-nourished.  HENT:  Head: Normocephalic and atraumatic.  Eyes: EOM are normal. Pupils are equal, round, and reactive to light.  Neck: Normal range of motion. Neck supple.  Cardiovascular: Normal rate, regular rhythm and normal heart sounds.   No murmur heard. Pulmonary/Chest: Effort normal and breath sounds normal.  Abdominal: Soft. Normal appearance and bowel sounds are normal. There is tenderness in the right lower  quadrant. There is no rigidity, no rebound, no guarding, no CVA tenderness, no tenderness at McBurney's point and negative Murphy's sign.  Musculoskeletal: Normal range of motion.  Neurological: She is alert and oriented to person, place, and time.  Skin: Skin is warm and dry.  Psychiatric: She has a normal mood and affect. Her behavior is normal. Thought content normal.  Nursing note and vitals reviewed.  ED Course  Procedures (including critical care time) Labs Review Labs Reviewed  COMPREHENSIVE METABOLIC PANEL - Abnormal; Notable for the following:    Calcium 8.3 (*)    All other components within normal limits  CBC WITH DIFFERENTIAL/PLATELET - Abnormal; Notable for the following:    RBC 5.29 (*)    Hemoglobin 11.1 (*)    HCT 34.8 (*)    MCV 65.8 (*)    MCH 21.0 (*)    RDW 16.4 (*)    All other components within normal limits  LIPASE, BLOOD  URINALYSIS, ROUTINE W REFLEX MICROSCOPIC (NOT AT [facility])   Imaging Review No results found. I have personally reviewed and evaluated these images and lab results as part of my medical decision-making.   EKG Interpretation None      MDM  .I have reviewed and evaluated the relevant laboratory values.I have reviewed and evaluated the relevant imaging studies.I have reviewed the relevant previous healthcare records.I have reviewed EMS Documentation.I obtained HPI from historian. Patient discussed with supervising physician  ED Course:  Assessment: Pt is a 25yF who presents with RLQ abdominal pain since previous visit to ED. On exam, pt in NAD. Nontoxic/nonseptic appearing. VSS. Afebrile. Lungs CTA. Heart RRR. Abdomen TTP RLQ. Labs unremarkable. No rising WBC from previous visit. CT Imaging from Thursday showed no acute abnormalities. Did show enlarged lymph nodes. Pt being treated for UTI currently. Given fluids and analgesia in ED. Low concern for appendicitis based on history, imaging, and labs. Pt pain improved in ED with fluids and  NSAIDs. Plan is to DC home with strict return precautions. At time of discharge, Patient is in no acute distress. Vital Signs are stable. Patient is able to ambulate. Patient able to tolerate PO.   Disposition/Plan:  DC Home Additional Verbal discharge instructions given and discussed with patient.  Pt Instructed to f/u with PCP for evaluation and treatment of symptoms. Return precautions given Pt acknowledges and agrees with plan  Supervising Physician Lyndal Pulleyaniel Knott, MD   Final diagnoses:  Right lower quadrant abdominal pain      Audry Piliyler Holston Oyama, PA-C 09/09/15 1607  Lyndal Pulleyaniel Knott, MD 09/12/15 2320

## 2015-09-09 NOTE — ED Notes (Signed)
Presents with abd pain, was seen this past Thursday, states onset of abd pain was last Monday. Denies any N/V. No urinary frequecy or odor noted in urine.

## 2015-09-09 NOTE — ED Notes (Signed)
Pt was seen in this ED on Thursday for pain.  Pt states tramadol as Rx'd has not helped.  Right abdominal pain persists today.

## 2015-09-09 NOTE — ED Notes (Signed)
Attempted x 2 with ultrasound, blood return easily obtained, but unable to advance catheter.

## 2015-10-25 ENCOUNTER — Emergency Department (HOSPITAL_BASED_OUTPATIENT_CLINIC_OR_DEPARTMENT_OTHER): Payer: Medicaid Other

## 2015-10-25 ENCOUNTER — Emergency Department (HOSPITAL_BASED_OUTPATIENT_CLINIC_OR_DEPARTMENT_OTHER)
Admission: EM | Admit: 2015-10-25 | Discharge: 2015-10-25 | Disposition: A | Discharge status: Home / Self Care | Payer: Medicaid Other | Attending: Emergency Medicine | Admitting: Emergency Medicine

## 2015-10-25 ENCOUNTER — Encounter (HOSPITAL_BASED_OUTPATIENT_CLINIC_OR_DEPARTMENT_OTHER): Payer: Self-pay | Admitting: Emergency Medicine

## 2015-10-25 DIAGNOSIS — R059 Cough, unspecified: Secondary | ICD-10-CM

## 2015-10-25 DIAGNOSIS — B9789 Other viral agents as the cause of diseases classified elsewhere: Secondary | ICD-10-CM

## 2015-10-25 DIAGNOSIS — J069 Acute upper respiratory infection, unspecified: Secondary | ICD-10-CM | POA: Diagnosis not present

## 2015-10-25 DIAGNOSIS — H6591 Unspecified nonsuppurative otitis media, right ear: Secondary | ICD-10-CM

## 2015-10-25 DIAGNOSIS — R05 Cough: Secondary | ICD-10-CM

## 2015-10-25 DIAGNOSIS — H65191 Other acute nonsuppurative otitis media, right ear: Secondary | ICD-10-CM | POA: Diagnosis not present

## 2015-10-25 DIAGNOSIS — J988 Other specified respiratory disorders: Secondary | ICD-10-CM

## 2015-10-25 DIAGNOSIS — E669 Obesity, unspecified: Secondary | ICD-10-CM | POA: Diagnosis not present

## 2015-10-25 DIAGNOSIS — I10 Essential (primary) hypertension: Secondary | ICD-10-CM | POA: Insufficient documentation

## 2015-10-25 MED ORDER — IBUPROFEN 200 MG PO TABS
600.0000 mg | ORAL_TABLET | Freq: Once | ORAL | Status: AC
  Administered 2015-10-25: 600 mg via ORAL
  Filled 2015-10-25: qty 1

## 2015-10-25 MED ORDER — GUAIFENESIN-CODEINE 100-10 MG/5ML PO SOLN: 10.0000 mL | Freq: Three times a day (TID) | ORAL | Status: DC | PRN

## 2015-10-25 NOTE — Discharge Instructions (Signed)
Your chest x-ray does not show pneumonia. You have or fluid behind her right ear likely due to a viral process that is causing right ear pain. Continue to take Motrin and Tylenol as needed for pain control. Get plenty of rest and drink plenty of fluids. Return without fail for worsening symptoms, including difficulty breathing, worsening pain, passing out, new fevers, or any other symptoms concerning to you.  Cough, Adult Coughing is a reflex that clears your throat and your airways. Coughing helps to heal and protect your lungs. It is normal to cough occasionally, but a cough that happens with other symptoms or lasts a long time may be a sign of a condition that needs treatment. A cough may last only 2-3 weeks (acute), or it may last longer than 8 weeks (chronic). CAUSES Coughing is commonly caused by:  Breathing in substances that irritate your lungs.  A viral or bacterial respiratory infection.  Allergies.  Asthma.  Postnasal drip.  Smoking.  Acid backing up from the stomach into the esophagus (gastroesophageal reflux).  Certain medicines.  Chronic lung problems, including COPD (or rarely, lung cancer).  Other medical conditions such as heart failure. HOME CARE INSTRUCTIONS  Pay attention to any changes in your symptoms. Take these actions to help with your discomfort:  Take medicines only as told by your health care provider.  If you were prescribed an antibiotic medicine, take it as told by your health care provider. Do not stop taking the antibiotic even if you start to feel better.  Talk with your health care provider before you take a cough suppressant medicine.  Drink enough fluid to keep your urine clear or pale yellow.  If the air is dry, use a cold steam vaporizer or humidifier in your bedroom or your home to help loosen secretions.  Avoid anything that causes you to cough at work or at home.  If your cough is worse at night, try sleeping in a semi-upright  position.  Avoid cigarette smoke. If you smoke, quit smoking. If you need help quitting, ask your health care provider.  Avoid caffeine.  Avoid alcohol.  Rest as needed. SEEK MEDICAL CARE IF:   You have new symptoms.  You cough up pus.  Your cough does not get better after 2-3 weeks, or your cough gets worse.  You cannot control your cough with suppressant medicines and you are losing sleep.  You develop pain that is getting worse or pain that is not controlled with pain medicines.  You have a fever.  You have unexplained weight loss.  You have night sweats. SEEK IMMEDIATE MEDICAL CARE IF:  You cough up blood.  You have difficulty breathing.  Your heartbeat is very fast.   This information is not intended to replace advice given to you by your health care provider. Make sure you discuss any questions you have with your health care provider.   Document Released: 11/30/2010 Document Revised: 02/22/2015 Document Reviewed: 08/10/2014 Elsevier Interactive Patient Education [employer]2016 [employer]

## 2015-10-25 NOTE — ED Notes (Signed)
Patient states that she is having cough and states that her chest is tight when she breaths. She reports that she is also has a sore throat.

## 2015-10-25 NOTE — ED Provider Notes (Signed)
CSN: [id number]     Arrival date & time 10/25/15  1427 History   First MD Initiated Contact with Patient 10/25/15 1547     Chief Complaint  Patient presents with  . Cough     (Consider location/radiation/quality/duration/timing/severity/associated sxs/prior Treatment) HPI  25 year old female who presents with cough and chest discomfort. States three days ago with cough, sore throat, congestion, runny nose. No known sick contacts, but kids go to daycare and her son has mild URI. No nausea, vomiting, diarrhea. With subjective fever and chills. No dysuria, urinary frequency, flank pain, or abdominal. With right ear otalgia. States she has chest discomfort with coughing and taking a deep breath in. No recent immbolization, PE history or DVT history or family history of hypercoagulability.  Past Medical History  Diagnosis Date  . Ovarian cyst     left side  . Hypertension   . Obesity   . Pregnant   . Pregnancy induced hypertension    Past Surgical History  Procedure Laterality Date  . Tonsillectomy    . Wisdom tooth extraction    . Dilation and curettage of uterus    . Cesarean section  04/09/2011    Procedure: CESAREAN SECTION;  Surgeon: Loney Laurence;  Location: [facility];  Service: Gynecology;  Laterality: N/A;  . Knee arthroscopy    . Cesarean section N/A 07/21/2014    Procedure: CESAREAN SECTION;  Surgeon: Freddrick March. Tenny Craw, MD;  Location: [facility];  Service: Obstetrics;  Laterality: N/A;  . Tubal ligation     Family History  Problem Relation Age of Onset  . Diabetes Mother   . Diabetes Maternal Grandmother   . Diabetes Maternal Grandfather   . Diabetes Paternal Grandmother   . Stroke Paternal Grandmother    Social History  Substance Use Topics  . Smoking status: Never Smoker   . Smokeless tobacco: None  . Alcohol Use: No   OB History    Gravida Para Term Preterm AB TAB SAB Ectopic Multiple Living   0 2     Review of Systems 10/14 systems reviewed and  are negative other than those stated in the HPI    Allergies  Lemon juice; Orange juice; Citrus; Penicillins; Sulfa antibiotics; Amoxicillin; and Latex  Home Medications   Prior to Admission medications   Medication Sig Start Date End Date Taking? Authorizing Provider  Alum & Mag Hydroxide-Simeth (MAGIC MOUTHWASH) SOLN Take 5 mLs by mouth 4 (four) times daily as needed for mouth pain. 11/21/14   April Palumbo, MD  azithromycin (ZITHROMAX) 250 MG tablet Take 1 tablet (250 mg total) by mouth daily. Take first 2 tablets together, then 1 every day until finished. 03/04/15   Elwin Mocha, MD  fluticasone [facility]) 50 MCG/ACT nasal spray Place 2 sprays into both nostrils daily. 11/20/14   April Palumbo, MD  guaiFENesin-codeine 100-10 MG/5ML syrup Take 10 mLs by mouth 3 (three) times daily as needed for cough. 10/25/15   Lavera Guise, MD  ibuprofen (ADVIL,MOTRIN) 800 MG tablet Take 1 tablet (800 mg total) by mouth 3 (three) times daily. 11/20/14   April Palumbo, MD  ibuprofen (ADVIL,MOTRIN) 800 MG tablet Take 1 tablet (800 mg total) by mouth 3 (three) times daily. 03/04/15   Elwin Mocha, MD  nitrofurantoin, macrocrystal-monohydrate, (MACROBID) 100 MG capsule Take 1 capsule (100 mg total) by mouth 2 (two) times daily. 09/06/15   Loren Racer, MD  traMADol (ULTRAM) 50 MG tablet Take 1 tablet (  50 mg total) by mouth every 6 (six) hours as needed. 09/06/15   Loren Raceravid Yelverton, MD   BP 128/73 mmHg  Pulse 74  Temp(Src) 98.6 F (37 C) (Oral)  Resp 22  Ht 5\' 6"  (1.676 m)  Wt 353 lb (160.12 kg)  BMI 57.00 kg/m2  SpO2 100%  LMP 10/18/2015 Physical Exam Physical Exam  Nursing note and vitals reviewed. Constitutional: Well developed, well nourished, non-toxic, and in no acute distress Head: Normocephalic and atraumatic.  Ears: right middle ear effusion with slight buldge Mouth/Throat: Oropharynx is clear and moist.  Neck: Normal range of motion. Neck supple.  Cardiovascular: Normal rate and regular rhythm.    Pulmonary/Chest: Effort normal and breath sounds normal.  Abdominal: Soft. There is no tenderness. There is no rebound and no guarding.  Musculoskeletal: Normal range of motion.  Neurological: Alert, no facial droop, fluent speech, moves all extremities symmetrically Skin: Skin is warm and dry.  Psychiatric: Cooperative  ED Course  Procedures (including critical care time) Labs Review Labs Reviewed - No data to display  Imaging Review Dg Chest 2 View  10/25/2015  CLINICAL DATA:  Cough. EXAM: CHEST  2 VIEW COMPARISON:  July 14, 2015. FINDINGS: The heart size and mediastinal contours are within normal limits. Both lungs are clear. The visualized skeletal structures are unremarkable. IMPRESSION: No active cardiopulmonary disease. Electronically Signed   By: Lupita RaiderJames  Green Jr, M.D.   On: 10/25/2015 15:02   I have personally reviewed and evaluated these images and lab results as part of my medical decision-making.   EKG Interpretation   Date/Time:  Wednesday Oct 25 2015 16:16:58 EDT Ventricular Rate:  76 PR Interval:  154 QRS Duration: 82 QT Interval:  370 QTC Calculation: 416 R Axis:   52 Text Interpretation:  Sinus rhythm Baseline wander in lead(s) I II aVR No  significant change since last tracing Confirmed by Detavious Rinn MD, Cherylanne Ardelean [phone]([id number]) on  10/25/2015 4:40:50 PM      MDM   Final diagnoses:  Cough  Viral respiratory infection  Middle ear effusion, right    25 year old female who presents with 3 days of cough, congestion, sore throat, and runny nose. Overall clinical presentation seems consistent with likely that of a viral respiratory illness. Vital signs are within normal limits. She is breathing comfortably on room air. Lungs are clear to auscultation bilaterally and her chest x-ray shows no acute cardiopulmonary processes. Her chest pain seems more musculoskeletal in nature. PERC negative. EKG is unremarkable for ischemia, strain, or stigmata of arrhythmia. Discussed supportive  care instructions for home. Strict return and follow-up instructions are reviewed.  She expressed understanding of all discharge instructions and felt comfortable with the plan of care.     Lavera Guiseana Duo Anitta Tenny, MD 10/25/15 [phone]

## 2015-10-26 ENCOUNTER — Emergency Department (HOSPITAL_BASED_OUTPATIENT_CLINIC_OR_DEPARTMENT_OTHER)
Admission: EM | Admit: 2015-10-26 | Discharge: 2015-10-26 | Disposition: A | Discharge status: Home / Self Care | Payer: No Typology Code available for payment source | Attending: Emergency Medicine | Admitting: Emergency Medicine

## 2015-10-26 ENCOUNTER — Encounter (HOSPITAL_BASED_OUTPATIENT_CLINIC_OR_DEPARTMENT_OTHER): Payer: Self-pay

## 2015-10-26 DIAGNOSIS — M545 Low back pain, unspecified: Secondary | ICD-10-CM

## 2015-10-26 DIAGNOSIS — M542 Cervicalgia: Secondary | ICD-10-CM | POA: Diagnosis present

## 2015-10-26 DIAGNOSIS — I10 Essential (primary) hypertension: Secondary | ICD-10-CM | POA: Insufficient documentation

## 2015-10-26 DIAGNOSIS — E669 Obesity, unspecified: Secondary | ICD-10-CM | POA: Insufficient documentation

## 2015-10-26 MED ORDER — CYCLOBENZAPRINE HCL 10 MG PO TABS: 10.0000 mg | ORAL_TABLET | Freq: Two times a day (BID) | ORAL | Status: DC | PRN

## 2015-10-26 MED ORDER — IBUPROFEN 800 MG PO TABS: 800.0000 mg | ORAL_TABLET | Freq: Three times a day (TID) | ORAL | Status: DC

## 2015-10-26 MED ORDER — IBUPROFEN 800 MG PO TABS
800.0000 mg | ORAL_TABLET | Freq: Once | ORAL | Status: AC
  Administered 2015-10-26: 800 mg via ORAL
  Filled 2015-10-26: qty 1
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
## 2015-10-30 ENCOUNTER — Encounter (HOSPITAL_BASED_OUTPATIENT_CLINIC_OR_DEPARTMENT_OTHER): Payer: Self-pay

## 2015-10-30 ENCOUNTER — Emergency Department (HOSPITAL_BASED_OUTPATIENT_CLINIC_OR_DEPARTMENT_OTHER)
Admission: EM | Admit: 2015-10-30 | Discharge: 2015-10-30 | Disposition: A | Discharge status: Home / Self Care | Payer: Medicaid Other | Attending: Emergency Medicine | Admitting: Emergency Medicine

## 2015-10-30 DIAGNOSIS — Y999 Unspecified external cause status: Secondary | ICD-10-CM | POA: Diagnosis not present

## 2015-10-30 DIAGNOSIS — M545 Low back pain: Secondary | ICD-10-CM | POA: Insufficient documentation

## 2015-10-30 DIAGNOSIS — I10 Essential (primary) hypertension: Secondary | ICD-10-CM | POA: Insufficient documentation

## 2015-10-30 DIAGNOSIS — M542 Cervicalgia: Secondary | ICD-10-CM | POA: Insufficient documentation

## 2015-10-30 DIAGNOSIS — E669 Obesity, unspecified: Secondary | ICD-10-CM | POA: Diagnosis not present

## 2015-10-30 DIAGNOSIS — Y939 Activity, unspecified: Secondary | ICD-10-CM | POA: Diagnosis not present

## 2015-10-30 DIAGNOSIS — Y92410 Unspecified street and highway as the place of occurrence of the external cause: Secondary | ICD-10-CM | POA: Insufficient documentation
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
## 2016-03-24 ENCOUNTER — Encounter (HOSPITAL_BASED_OUTPATIENT_CLINIC_OR_DEPARTMENT_OTHER): Payer: Self-pay | Admitting: Emergency Medicine

## 2016-03-24 ENCOUNTER — Emergency Department (HOSPITAL_BASED_OUTPATIENT_CLINIC_OR_DEPARTMENT_OTHER)
Admission: EM | Admit: 2016-03-24 | Discharge: 2016-03-25 | Disposition: A | Discharge status: Home / Self Care | Payer: Medicaid Other | Attending: Emergency Medicine | Admitting: Emergency Medicine

## 2016-03-24 DIAGNOSIS — I10 Essential (primary) hypertension: Secondary | ICD-10-CM | POA: Insufficient documentation

## 2016-03-24 DIAGNOSIS — M25552 Pain in left hip: Secondary | ICD-10-CM

## 2016-03-24 MED ORDER — IBUPROFEN 400 MG PO TABS
600.0000 mg | ORAL_TABLET | Freq: Once | ORAL | Status: AC
  Administered 2016-03-24: 600 mg via ORAL
  Filled 2016-03-24: qty 1

## 2016-03-24 NOTE — ED Provider Notes (Signed)
[facility] Provider Note   CSN: [id number] Arrival date & time: 03/24/16  2328  By signing my name below, I, Kimberly [name], attest that this documentation has been prepared under the direction and in the presence of non-physician practitioner, Carlene Coria, PA-C. Electronically Signed: Majel [name], Scribe. 03/24/2016. 11:47 PM.  History   Chief Complaint Chief Complaint  Patient presents with  . Hip Pain   The history is provided by the patient. No language interpreter was used.   HPI Comments: Kimberly [name] is a 25 y.o. female with PMHx of HTN, who presents to the Emergency Department complaining of gradually worsening, left hip pain that began 2 weeks ago and worsened today. Pt reports associated left low back pain. She feels like her hip is swollen. 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 No known injury or trauma. No weakness, numbness, or tingling. She states she has taken tylenol with no relief of her pain. She denies recent injury or trauma, dysuria, hematuria, urinary frequency, nausea, vomiting, fever and chills. Pt states she is able to ambulate without difficulty. She is requesting a urine test as she looked up her symptoms online and is worried she has a UTI/kidney infection.  Past Medical History:  Diagnosis Date  . Hypertension   . Obesity   . Ovarian cyst    left side  . Pregnancy induced hypertension   . Pregnant    Patient Active Problem List   Diagnosis Date Noted  . Parapneumonic effusion 07/26/2014  . HTN (hypertension) 07/26/2014  . Hypokalemia 07/26/2014  . Leukocytosis 07/26/2014  . Healthcare-associated pneumonia   . Maternal morbid obesity, delivered, current hospitalization (HCC) 07/21/2014  . PIH (pregnancy induced hypertension) 07/19/2014  . Maternal morbid obesity, antepartum (HCC)   . Prior pregnancy complicated by IUGR, antepartum   . Hx of preeclampsia, prior pregnancy,  currently pregnant    Past Surgical History:  Procedure Laterality Date  . CESAREAN SECTION  04/09/2011   Procedure: CESAREAN SECTION;  Surgeon: Loney Laurence;  Location: [facility];  Service: Gynecology;  Laterality: N/A;  . CESAREAN SECTION N/A 07/21/2014   Procedure: CESAREAN SECTION;  Surgeon: Freddrick March. Tenny Craw, MD;  Location: [facility];  Service: Obstetrics;  Laterality: N/A;  . DILATION AND CURETTAGE OF UTERUS    . KNEE ARTHROSCOPY    . TONSILLECTOMY    . TUBAL LIGATION    . WISDOM TOOTH EXTRACTION      OB History    Gravida Para Term Preterm AB Living   5 2 1 1 3 2    SAB TAB Ectopic Multiple Live Births   1 1 1  0 2     Home Medications    Prior to Admission medications   Medication Sig Start Date End Date Taking? Authorizing Provider  cyclobenzaprine (FLEXERIL) 10 MG tablet Take 1 tablet (10 mg total) by mouth 2 (two) times daily as needed for muscle spasms. 10/26/15   Stevi Barrett, PA-C  ibuprofen (ADVIL,MOTRIN) 800 MG tablet Take 1 tablet (800 mg total) by mouth 3 (three) times daily. 11/20/14   April Palumbo, MD    Family History Family History  Problem Relation Age of Onset  . Diabetes Mother   . Diabetes Maternal Grandmother   . Diabetes Maternal Grandfather   . Diabetes Paternal Grandmother   . Stroke Paternal Grandmother     Social History Social History  Substance Use Topics  . Smoking  status: Never Smoker  . Smokeless tobacco: Not on file  . Alcohol use No     Allergies   Lemon juice; Orange juice; Citrus; Penicillins; Sulfa antibiotics; Amoxicillin; and Latex   Review of Systems Review of Systems 10 systems reviewed and all are negative for acute change except as noted in the HPI.  Physical Exam Updated Vital Signs LMP  (LMP Unknown)   Physical Exam  Constitutional: She is oriented to person, place, and time.  Obese, NAD  HENT:  Head: Normocephalic.  Right Ear: External ear normal.  Left Ear: External ear normal.  Nose: Nose normal.    Mouth/Throat: Oropharynx is clear and moist. No oropharyngeal exudate.  Eyes: Conjunctivae and EOM are normal.  Neck: Normal range of motion. Neck supple.  Cardiovascular: Normal rate, regular rhythm, normal heart sounds and intact distal pulses.   Pulmonary/Chest: Effort normal and breath sounds normal. No respiratory distress. She has no wheezes.  Abdominal: Soft. Bowel sounds are normal. She exhibits no distension. There is no tenderness. There is no rebound and no guarding.  Musculoskeletal: Normal range of motion. She exhibits no edema.  Mild diffuse left hip tenderness. No CVA tenderness. No midline back tenderness. FROM of bilateral hips with no laxity or crepitus. No pelvic instability   Lymphadenopathy:    She has no cervical adenopathy.  Neurological: She is alert and oriented to person, place, and time. No cranial nerve deficit.  5/5 strength throughout  Skin: Skin is warm and dry.  Psychiatric: She has a normal mood and affect.  Nursing note and vitals reviewed.  ED Treatments / Results  Labs (all labs ordered are listed, but only abnormal results are displayed) Labs Reviewed  URINALYSIS, ROUTINE W REFLEX MICROSCOPIC (NOT [facility]) - Abnormal; Notable for the following:       Result Value   APPearance CLOUDY (*)    All other components within normal limits    EKG  EKG Interpretation None       Radiology No results found.  Procedures Procedures (including critical care time)  Medications Ordered in ED Medications - No data to display  DIAGNOSTIC STUDIES:  Oxygen Saturation is 97% on RA, normal by my interpretation.    COORDINATION OF CARE:  11:43 PM Discussed treatment plan with pt at bedside and pt agreed to plan.  Initial Impression / Assessment and Plan / ED Course  I have reviewed the triage vital signs and the nursing notes.  Pertinent labs & imaging results that were available during my care of the patient were reviewed by me and considered in  my medical decision making (see chart for details).  Clinical Course    UA negative. Likely musculoskeletal pain. Discussed with pt and decided to defer imaging tonight. Will trial NSAID and muscle relaxant. Encouraged ortho f/u. ER return precautions given.  I personally performed the services described in this documentation, which was scribed in my presence. The recorded information has been reviewed and is accurate.   Final Clinical Impressions(s) / ED Diagnoses   Final diagnoses:  Left hip pain    New Prescriptions New Prescriptions   METHOCARBAMOL (ROBAXIN) 500 MG TABLET    Take 1 tablet (500 mg total) by mouth 2 (two) times daily.   NAPROXEN (NAPROSYN) 500 MG TABLET    Take 1 tablet (500 mg total) by mouth 2 (two) times daily.     Carlene CoriaSerena Y Razan Siler, PA-C 03/25/16 0040    Dione Boozeavid Glick, MD 03/25/16 [phone]

## 2016-03-24 NOTE — ED Triage Notes (Signed)
Pt in c/o L hip pain, denies injury or trauma. Pt alert, interactive, ambulatory to tx room with steady gait in NAD.

## 2016-03-25 LAB — URINALYSIS, ROUTINE W REFLEX MICROSCOPIC
BILIRUBIN URINE: NEGATIVE
Glucose, UA: NEGATIVE mg/dL
Hgb urine dipstick: NEGATIVE
KETONES UR: NEGATIVE mg/dL
Leukocytes, UA: NEGATIVE
NITRITE: NEGATIVE
PROTEIN: NEGATIVE mg/dL
Specific Gravity, Urine: 1.024 (ref 1.005–1.030)
pH: 6 (ref 5.0–8.0)

## 2016-03-25 MED ORDER — METHOCARBAMOL 500 MG PO TABS: 500.0000 mg | ORAL_TABLET | Freq: Two times a day (BID) | ORAL | 0 refills | Status: DC

## 2016-03-25 MED ORDER — NAPROXEN 500 MG PO TABS: 500.0000 mg | ORAL_TABLET | Freq: Two times a day (BID) | ORAL | 0 refills | Status: DC

## 2016-03-25 NOTE — Discharge Instructions (Signed)
Your urine test was normal. Your pain is likely musculoskeletal. Take medication as prescribed. Call Dr. Lazaro ArmsHudnall's office to schedule an appointment for follow up. Return to the ER for new or worsening symptoms.

## 2016-06-10 IMAGING — DX DG CHEST 2V
2 series · 2 of 2 positions shown · non-contrast
Comparison: 11/21/2014

CLINICAL DATA: Chest pain for 2 days.  Central chest pressure.

EXAM:
CHEST  2 VIEW

[chest pa]
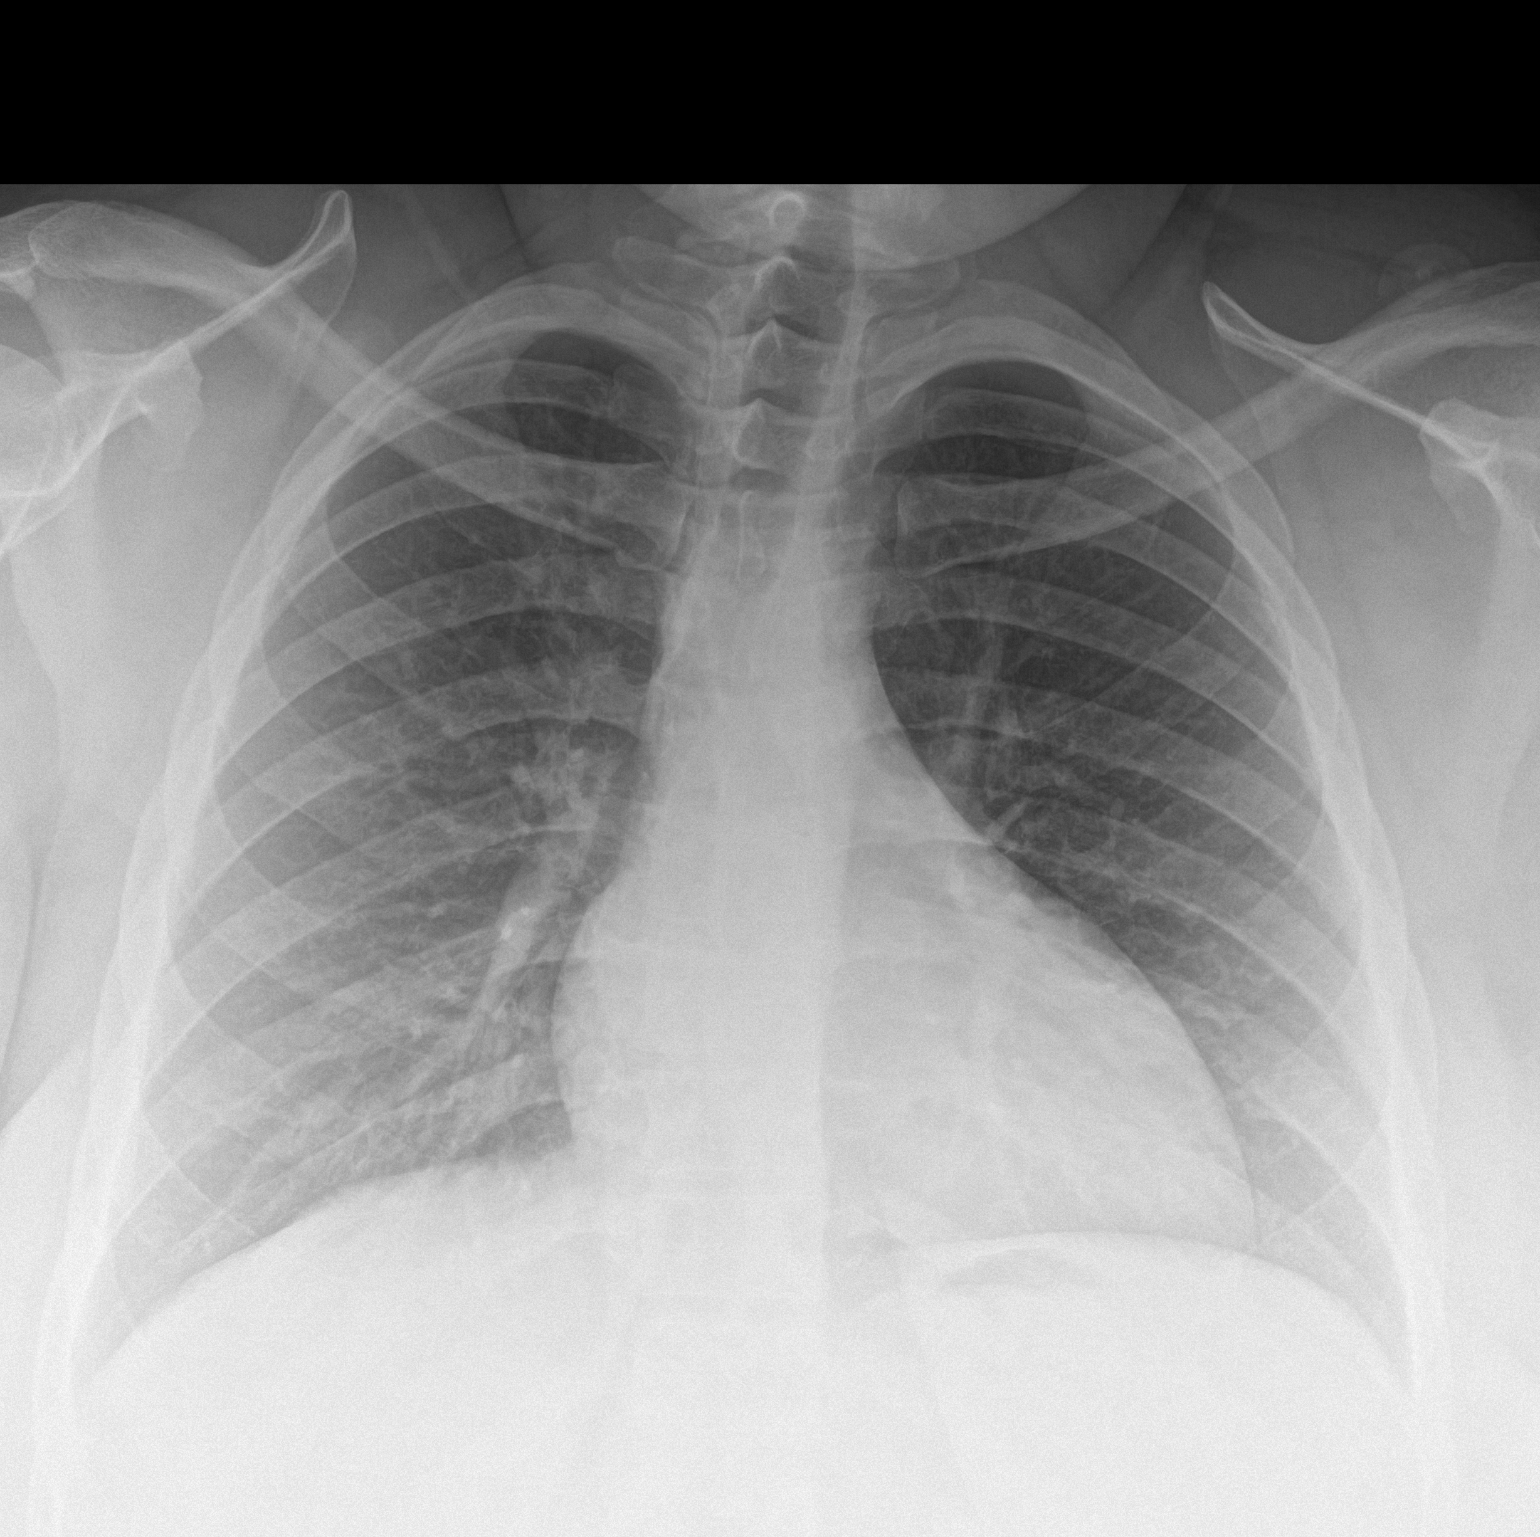

[chest lat]
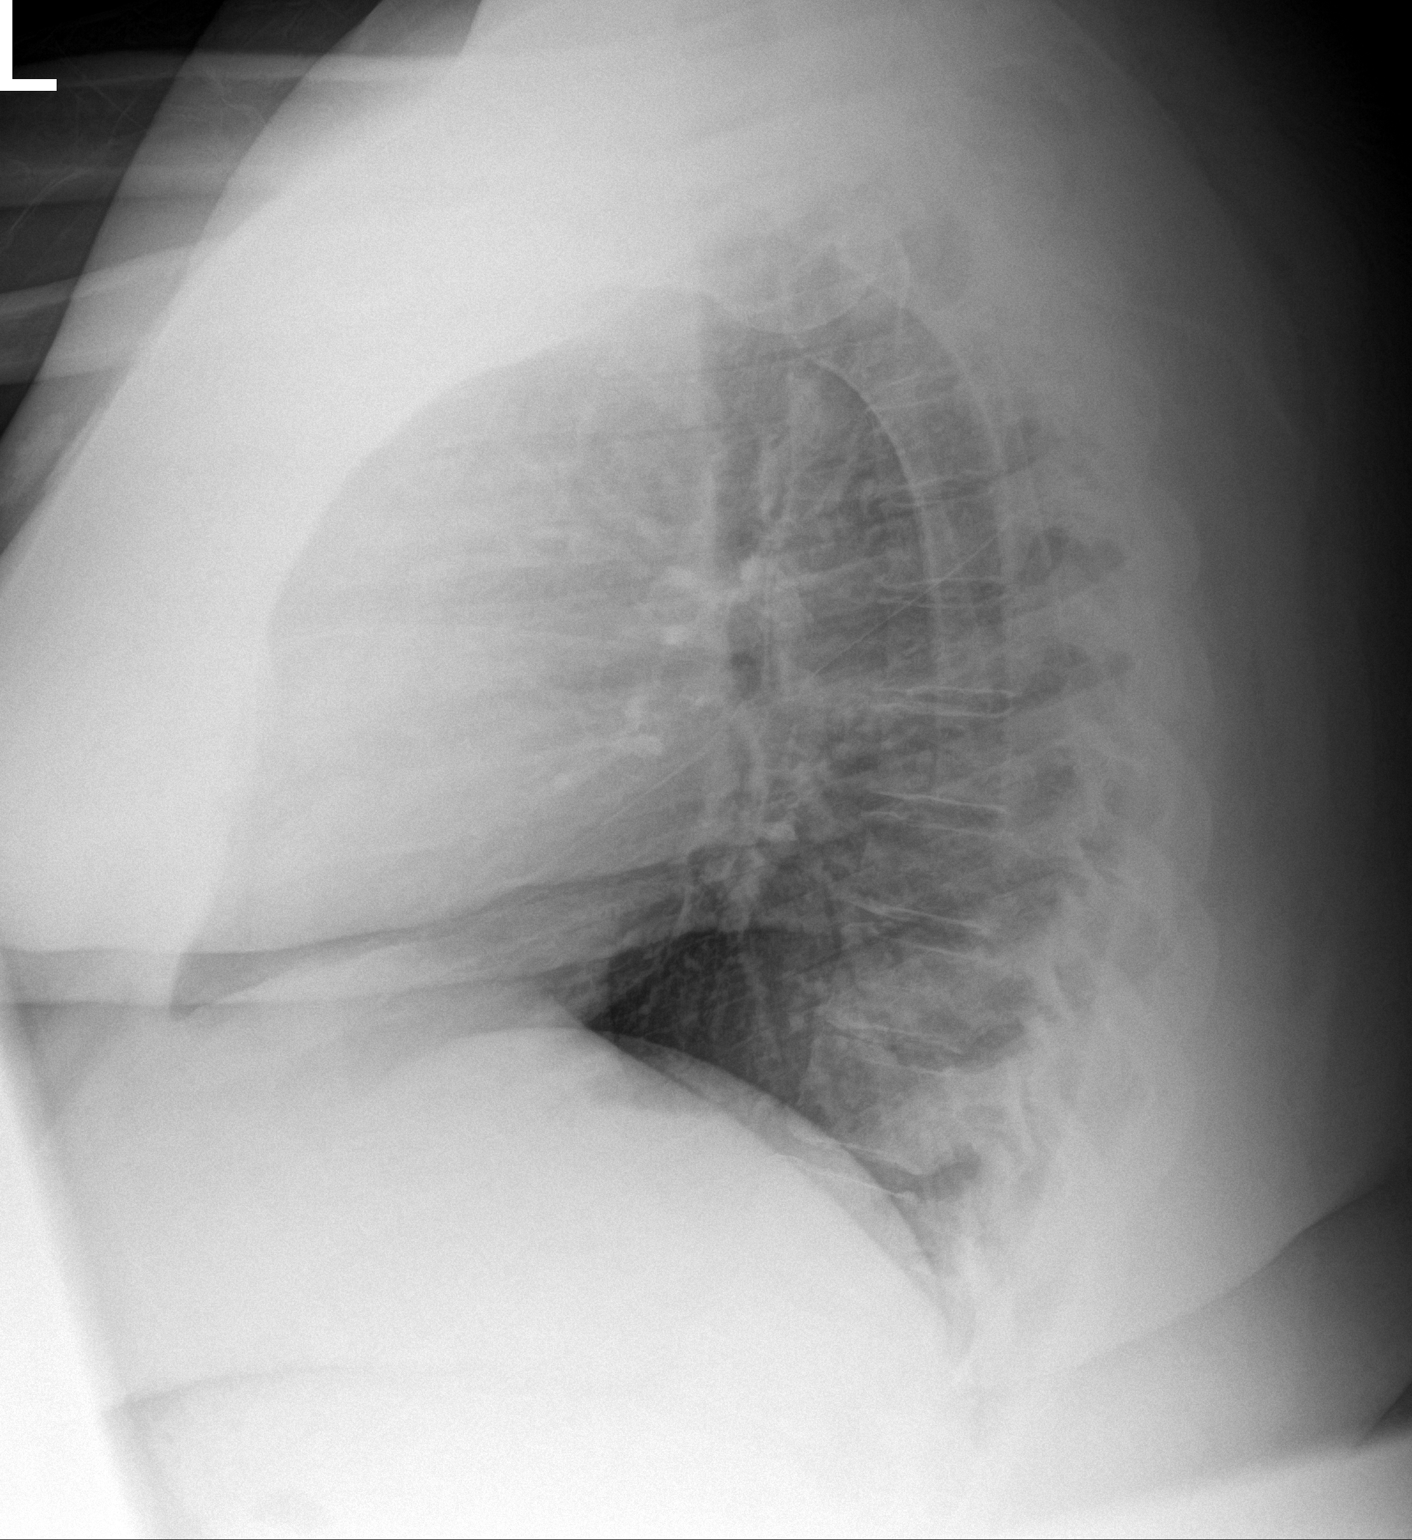

[2 of 2 positions shown; findings below may reference images not displayed]

FINDINGS: The heart size and mediastinal contours are within normal limits.
Both lungs are clear. The visualized skeletal structures are
unremarkable.
IMPRESSION: No active cardiopulmonary disease.

## 2016-08-06 ENCOUNTER — Emergency Department (HOSPITAL_COMMUNITY)
Admission: EM | Admit: 2016-08-06 | Discharge: 2016-08-07 | Disposition: A | Discharge status: Home / Self Care | Payer: Medicaid Other | Attending: Emergency Medicine | Admitting: Emergency Medicine

## 2016-08-06 ENCOUNTER — Encounter (HOSPITAL_COMMUNITY): Payer: Self-pay

## 2016-08-06 ENCOUNTER — Emergency Department (HOSPITAL_COMMUNITY): Payer: Medicaid Other

## 2016-08-06 DIAGNOSIS — Z91040 Latex allergy status: Secondary | ICD-10-CM | POA: Insufficient documentation

## 2016-08-06 DIAGNOSIS — R52 Pain, unspecified: Secondary | ICD-10-CM

## 2016-08-06 DIAGNOSIS — Z79899 Other long term (current) drug therapy: Secondary | ICD-10-CM | POA: Diagnosis not present

## 2016-08-06 DIAGNOSIS — M62838 Other muscle spasm: Secondary | ICD-10-CM | POA: Diagnosis not present

## 2016-08-06 DIAGNOSIS — I10 Essential (primary) hypertension: Secondary | ICD-10-CM | POA: Insufficient documentation

## 2016-08-06 DIAGNOSIS — M542 Cervicalgia: Secondary | ICD-10-CM | POA: Diagnosis present

## 2016-08-06 LAB — CBC
HEMATOCRIT: 29.8 % — AB (ref 36.0–46.0)
HEMOGLOBIN: 9.4 g/dL — AB (ref 12.0–15.0)
MCH: 20 pg — AB (ref 26.0–34.0)
MCHC: 31.5 g/dL (ref 30.0–36.0)
MCV: 63.4 fL — AB (ref 78.0–100.0)
Platelets: 318 10*3/uL (ref 150–400)
RBC: 4.7 MIL/uL (ref 3.87–5.11)
RDW: 17.3 % — ABNORMAL HIGH (ref 11.5–15.5)
WBC: 10.1 10*3/uL (ref 4.0–10.5)

## 2016-08-06 LAB — BASIC METABOLIC PANEL
ANION GAP: 7 (ref 5–15)
BUN: 11 mg/dL (ref 6–20)
CALCIUM: 9.4 mg/dL (ref 8.9–10.3)
CHLORIDE: 105 mmol/L (ref 101–111)
CO2: 25 mmol/L (ref 22–32)
Creatinine, Ser: 0.67 mg/dL (ref 0.44–1.00)
GFR calc Af Amer: >60 mL/min (ref >60)
GFR calc non Af Amer: >60 mL/min (ref >60)
GLUCOSE: 102 mg/dL — AB (ref 65–99)
Potassium: 3.7 mmol/L (ref 3.5–5.1)
Sodium: 137 mmol/L (ref 135–145)

## 2016-08-06 LAB — I-STAT TROPONIN, ED: TROPONIN I, POC: 0 ng/mL (ref 0.00–0.08)

## 2016-08-06 NOTE — ED Triage Notes (Signed)
Patient came in with right side numbness  And tingling started 7 pm this evening. Pt state it feeling like shoting pain from her neck to her arm. Pt also c/o chest pain that started at 7 pm also with blurred vision and headache. pt rating her pain 10/10.

## 2016-08-07 ENCOUNTER — Emergency Department (HOSPITAL_COMMUNITY): Payer: Medicaid Other

## 2016-08-07 MED ORDER — MELOXICAM 7.5 MG PO TABS: 7.5000 mg | ORAL_TABLET | Freq: Every day | ORAL | 0 refills | Status: DC

## 2016-08-07 MED ORDER — ACETAMINOPHEN 500 MG PO TABS
1000.0000 mg | ORAL_TABLET | Freq: Once | ORAL | Status: AC
  Administered 2016-08-07: 1000 mg via ORAL
  Filled 2016-08-07: qty 2

## 2016-08-07 MED ORDER — METHOCARBAMOL 500 MG PO TABS: 500.0000 mg | ORAL_TABLET | Freq: Two times a day (BID) | ORAL | 0 refills | Status: DC

## 2016-08-07 MED ORDER — METHOCARBAMOL 500 MG PO TABS
1000.0000 mg | ORAL_TABLET | Freq: Once | ORAL | Status: AC
  Administered 2016-08-07: 1000 mg via ORAL
  Filled 2016-08-07: qty 2

## 2016-08-07 MED ORDER — KETOROLAC TROMETHAMINE 60 MG/2ML IM SOLN
60.0000 mg | Freq: Once | INTRAMUSCULAR | Status: AC
  Administered 2016-08-07: 60 mg via INTRAMUSCULAR
  Filled 2016-08-07: qty 2

## 2016-08-07 NOTE — ED Provider Notes (Signed)
[facility]-EMERGENCY DEPT Provider Note   CSN: [id number] Arrival date & time: 08/06/16  2233   By signing my name below, I, Clovis Pu, attest that this documentation has been prepared under the direction and in the presence of Safa Derner, MD  Electronically Signed: Clovis Pu, ED Scribe. 08/07/16. 1:42 AM.   History   Chief Complaint Chief Complaint  Patient presents with  . Chest Pain  . Numbness    right side numbness   The history is provided by the patient. No language interpreter was used.  Shoulder Pain   This is a new problem. The current episode started 12 to 24 hours ago. The problem occurs constantly. The problem has not changed since onset.The pain is present in the neck (right upper chest wall). The quality of the pain is described as aching. The pain is moderate. Pertinent negatives include no numbness. Exacerbated by: movement and palpation. Treatments tried: one dose of tylenol. The treatment provided no relief. There has been no history of extremity trauma. Family history is significant for no rheumatoid arthritis and no gout.   HPI Comments:  Kimberly [name] is a 26 y.o. female who presents to the Emergency Department complaining of acute onset neck pain which radiates down her right shoulder and right upper extremity that began around 6 AM yesterday. Pt also chest pain onset yesterday at 7 PM. She has taken tylenol with no significant relief. Pt denies any recent long distance travel by plane, long distance car travel, birth control use or any other associated symptoms. No other complaints noted.    Past Medical History:  Diagnosis Date  . Hypertension   . Obesity   . Ovarian cyst    left side  . Pregnancy induced hypertension   . Pregnant     Patient Active Problem List   Diagnosis Date Noted  . Parapneumonic effusion 07/26/2014  . HTN (hypertension) 07/26/2014  . Hypokalemia 07/26/2014  . Leukocytosis 07/26/2014  . Healthcare-associated pneumonia   .  Maternal morbid obesity, delivered, current hospitalization (HCC) 07/21/2014  . PIH (pregnancy induced hypertension) 07/19/2014  . Maternal morbid obesity, antepartum (HCC)   . Prior pregnancy complicated by IUGR, antepartum   . Hx of preeclampsia, prior pregnancy, currently pregnant     Past Surgical History:  Procedure Laterality Date  . CESAREAN SECTION  04/09/2011   Procedure: CESAREAN SECTION;  Surgeon: Loney Laurence;  Location: [facility];  Service: Gynecology;  Laterality: N/A;  . CESAREAN SECTION N/A 07/21/2014   Procedure: CESAREAN SECTION;  Surgeon: Freddrick March. Tenny Craw, MD;  Location: [facility];  Service: Obstetrics;  Laterality: N/A;  . DILATION AND CURETTAGE OF UTERUS    . KNEE ARTHROSCOPY    . TONSILLECTOMY    . TUBAL LIGATION    . WISDOM TOOTH EXTRACTION      OB History    Gravida Para Term Preterm AB Living   5 2 1 1 3 2    SAB TAB Ectopic Multiple Live Births   1 1 1  0 2       Home Medications    Prior to Admission medications   Medication Sig Start Date End Date Taking? Authorizing Provider  cyclobenzaprine (FLEXERIL) 10 MG tablet Take 1 tablet (10 mg total) by mouth 2 (two) times daily as needed for muscle spasms. Patient not taking: Reported on 08/07/2016 10/26/15   Rolm Gala Barrett, PA-C  ibuprofen (ADVIL,MOTRIN) 800 MG tablet Take 1 tablet (800 mg total) by mouth 3 (three) times daily. Patient not taking:  Reported on 08/07/2016 11/20/14   Alfie Alderfer, MD  methocarbamol (ROBAXIN) 500 MG tablet Take 1 tablet (500 mg total) by mouth 2 (two) times daily. Patient not taking: Reported on 08/07/2016 03/25/16   Ace GinsSerena Y Sam, PA-C  naproxen (NAPROSYN) 500 MG tablet Take 1 tablet (500 mg total) by mouth 2 (two) times daily. Patient not taking: Reported on 08/07/2016 03/25/16   Carlene CoriaSerena Y Sam, PA-C    Family History Family History  Problem Relation Age of Onset  . Diabetes Mother   . Diabetes Maternal Grandmother   . Diabetes Maternal Grandfather   . Diabetes Paternal Grandmother    . Stroke Paternal Grandmother     Social History Social History  Substance Use Topics  . Smoking status: Never Smoker  . Smokeless tobacco: Never Used  . Alcohol use No     Allergies   Lemon juice; Orange juice; Citrus; Penicillins; Sulfa antibiotics; Amoxicillin; and Latex   Review of Systems Review of Systems  Constitutional: Negative for diaphoresis.  Respiratory: Negative for cough, choking, shortness of breath, wheezing and stridor.   Cardiovascular: Negative for chest pain, palpitations and leg swelling.  Gastrointestinal: Negative for abdominal pain.  Musculoskeletal: Positive for myalgias and neck pain.  Neurological: Negative for numbness.  All other systems reviewed and are negative.  Physical Exam Updated Vital Signs BP 125/58 (BP Location: Left Arm) Comment: Simultaneous filing. User may not have seen previous data.  Pulse 73   Temp 98.5 F (36.9 C) (Oral)   Resp 12   Ht 5\' 7"  (1.702 m)   Wt (!) 353 lb (160.1 kg)   LMP 07/21/2016   SpO2 96%   BMI 55.29 kg/m   Physical Exam  Constitutional: She is oriented to person, place, and time. She appears well-developed and well-nourished. No distress.  HENT:  Head: Normocephalic and atraumatic.  Mouth/Throat: Oropharynx is clear and moist. No oropharyngeal exudate.  Moist mucous membranes   Eyes: Conjunctivae and EOM are normal. Pupils are equal, round, and reactive to light.  Neck: Normal range of motion. Neck supple. No JVD present. No tracheal deviation present. No thyromegaly present.  Trachea midline No bruit  Cardiovascular: Normal rate, regular rhythm, normal heart sounds and intact distal pulses.  Exam reveals no gallop and no friction rub.   No murmur heard. Intact distal pulses.   Pulmonary/Chest: Effort normal and breath sounds normal. No stridor. No apnea, no tachypnea and no bradypnea. No respiratory distress. She has no wheezes. She has no rales. She exhibits tenderness.  Right sided pectoralis  spasm   Abdominal: Soft. Bowel sounds are normal. She exhibits no distension and no mass. There is no tenderness. There is no rebound and no guarding.  Musculoskeletal: Normal range of motion. She exhibits no edema, tenderness or deformity.       Right shoulder: She exhibits spasm. She exhibits normal range of motion, no bony tenderness, no swelling, no effusion, no crepitus, no deformity, no laceration, no pain, normal pulse and normal strength.       Cervical back: She exhibits spasm.       Thoracic back: Normal.       Lumbar back: Normal.  Spasm in right trapezius. No step offs or crepitus of C, T or l or s  Negative homan's sign  Lymphadenopathy:    She has no cervical adenopathy.  Neurological: She is alert and oriented to person, place, and time. She has normal reflexes. She displays normal reflexes. She exhibits normal muscle tone. Coordination normal.  GCS eye subscore is 4. GCS verbal subscore is 5. GCS motor subscore is 6.  5/5 strength. 2+ DTRs. Sensation and motor intact. CN 2-12 intact.   Skin: Skin is warm and dry. Capillary refill takes less than 2 seconds.  Psychiatric: She has a normal mood and affect. Her behavior is normal.  Nursing note and vitals reviewed.   ED Treatments / Results   Vitals:   08/07/16 0315 08/07/16 0317  BP: 131/70 131/70  Pulse: 80 88  Resp:  17  Temp:     Results for orders placed or performed during the hospital encounter of 08/06/16  Basic metabolic panel  Result Value Ref Range   Sodium 137 135 - 145 mmol/L   Potassium 3.7 3.5 - 5.1 mmol/L   Chloride 105 101 - 111 mmol/L   CO2 25 22 - 32 mmol/L   Glucose, Bld 102 (H) 65 - 99 mg/dL   BUN 11 6 - 20 mg/dL   Creatinine, Ser 1.61 0.44 - 1.00 mg/dL   Calcium 9.4 8.9 - 09.6 mg/dL   GFR calc non Af Amer >60 >60 mL/min   GFR calc Af Amer >60 >60 mL/min   Anion gap 7 5 - 15  CBC  Result Value Ref Range   WBC 10.1 4.0 - 10.5 K/uL   RBC 4.70 3.87 - 5.11 MIL/uL   Hemoglobin 9.4 (L) 12.0 -  15.0 g/dL   HCT 04.5 (L) 40.9 - 81.1 %   MCV 63.4 (L) 78.0 - 100.0 fL   MCH 20.0 (L) 26.0 - 34.0 pg   MCHC 31.5 30.0 - 36.0 g/dL   RDW 91.4 (H) 78.2 - 95.6 %   Platelets 318 150 - 400 K/uL  I-stat troponin, ED  Result Value Ref Range   Troponin i, poc 0.00 0.00 - 0.08 ng/mL   Comment 3           Dg Chest 2 View  Result Date: 08/06/2016 CLINICAL DATA:  Sudden onset of chest pain EXAM: CHEST  2 VIEW COMPARISON:  10/25/2015 FINDINGS: No acute infiltrate or effusion. Heart size borderline enlarged. Pulmonary vascularity normal. No pneumothorax. IMPRESSION: No active cardiopulmonary disease. Electronically Signed   By: Jasmine Pang M.D.   On: 08/06/2016 23:57   Dg Cervical Spine Complete  Result Date: 08/07/2016 CLINICAL DATA:  26 year old female with neck pain and stiffness. No injury. EXAM: CERVICAL SPINE - COMPLETE 4+ VIEW COMPARISON:  None. FINDINGS: There is mild reversal of normal cervical lordosis which may be positional or due to muscle spasm. No acute fracture or subluxation. The vertebral body heights and disc spaces are maintained. The visualized spinous processes and the odontoid appear intact. There is anatomic alignment of the lateral masses of C1 and C2. The soft tissues appear unremarkable. IMPRESSION: 1. No acute fracture or subluxation. 2. Mildly reversal of normal cervical lordosis which may be positional or due to muscle spasm. Electronically Signed   By: Elgie Collard M.D.   On: 08/07/2016 02:46      COORDINATION OF CARE: 1:32 AM Discussed treatment plan with pt at bedside and pt agreed to plan.  EKG  EKG Interpretation  Date/Time:  Tuesday August 06 2016 22:57:47 EST Ventricular Rate:  84 PR Interval:    QRS Duration: 82 QT Interval:  357 QTC Calculation: 422 R Axis:   52 Text Interpretation:  Sinus rhythm Confirmed by [facility]  MD, Keven Soucy ([id number]) on 08/07/2016 1:34:40 AM       Radiology Dg Chest 2 View  Result Date: 08/06/2016 CLINICAL DATA:   Sudden onset of chest pain EXAM: CHEST  2 VIEW COMPARISON:  10/25/2015 FINDINGS: No acute infiltrate or effusion. Heart size borderline enlarged. Pulmonary vascularity normal. No pneumothorax. IMPRESSION: No active cardiopulmonary disease. Electronically Signed   By: Jasmine Pang M.D.   On: 08/06/2016 23:57    Procedures Procedures (including critical care time)  Medications Ordered in ED  Medications  ketorolac (TORADOL) injection 60 mg (60 mg Intramuscular Given 08/07/16 0141)  methocarbamol (ROBAXIN) tablet 1,000 mg (1,000 mg Oral Given 08/07/16 0140)  acetaminophen (TYLENOL) tablet 1,000 mg (1,000 mg Oral Given 08/07/16 0140)    PERC negative wells 0 highly doubt PE in this very low risk patient.  HEART score 1, very low risk for MACE.  Stable for discharge.     Final Clinical Impressions(s) / ED Diagnoses  Muscle spasms:  Continue meds previously prescribed. Return for fevers, weakness. Numbness , DOE, exertional chest pain, intractable pain or vomiting or any concerns.  The patient appears reasonably screened and/or stabilized for discharge and I doubt any other medical condition or other [facility] requiring further screening, evaluation, or treatment in the ED at this time prior to discharge.  I personally performed the services described in this documentation, which was scribed in my presence. The recorded information has been reviewed and is accurate.      Cy Blamer, MD 08/07/16 [phone]

## 2016-10-01 ENCOUNTER — Emergency Department (HOSPITAL_BASED_OUTPATIENT_CLINIC_OR_DEPARTMENT_OTHER)
Admission: EM | Admit: 2016-10-01 | Discharge: 2016-10-02 | Disposition: A | Discharge status: Home / Self Care | Payer: Medicaid Other | Attending: Emergency Medicine | Admitting: Emergency Medicine

## 2016-10-01 ENCOUNTER — Encounter (HOSPITAL_BASED_OUTPATIENT_CLINIC_OR_DEPARTMENT_OTHER): Payer: Self-pay

## 2016-10-01 DIAGNOSIS — I10 Essential (primary) hypertension: Secondary | ICD-10-CM | POA: Insufficient documentation

## 2016-10-01 DIAGNOSIS — J111 Influenza due to unidentified influenza virus with other respiratory manifestations: Secondary | ICD-10-CM

## 2016-10-01 DIAGNOSIS — J029 Acute pharyngitis, unspecified: Secondary | ICD-10-CM | POA: Diagnosis not present

## 2016-10-01 DIAGNOSIS — R69 Illness, unspecified: Secondary | ICD-10-CM

## 2016-10-01 DIAGNOSIS — R05 Cough: Secondary | ICD-10-CM | POA: Insufficient documentation

## 2016-10-01 DIAGNOSIS — R509 Fever, unspecified: Secondary | ICD-10-CM | POA: Diagnosis not present

## 2016-10-01 MED ORDER — IBUPROFEN 800 MG PO TABS
800.0000 mg | ORAL_TABLET | Freq: Once | ORAL | Status: AC
  Administered 2016-10-02: 800 mg via ORAL
  Filled 2016-10-01: qty 1

## 2016-10-01 NOTE — ED Triage Notes (Signed)
Pt also c/o CP that runs down left arm-EKG done in triage

## 2016-10-01 NOTE — ED Triage Notes (Signed)
c/o body aches, fever, flu like sx x 2 days-NAD-steady gait

## 2016-10-02 MED ORDER — ONDANSETRON 4 MG PO TBDP: 4.0000 mg | ORAL_TABLET | Freq: Three times a day (TID) | ORAL | 0 refills | Status: DC | PRN

## 2016-10-02 MED ORDER — OSELTAMIVIR PHOSPHATE 75 MG PO CAPS: 75.0000 mg | ORAL_CAPSULE | Freq: Two times a day (BID) | ORAL | 0 refills | Status: DC

## 2016-10-02 NOTE — ED Provider Notes (Signed)
TIME SEEN: 12:27 AM  CHIEF COMPLAINT: Flulike symptoms  HPI: Patient is a 26 year old female with history of obesity, hypertension who presents emergency department with 2 days of flulike symptoms. Reports subjective fevers, chills, nonproductive cough, sore throat, body aches. Initially told nursing staff that she felt short of breath but denies this to me. No chest pain or chest discomfort. Is asking for a "Z-Pak" to help with her symptoms.  ROS: See HPI Constitutional: Subjective fever  Eyes: no drainage  ENT:  runny nose   Cardiovascular:  no chest pain  Resp: no SOB  GI: no vomiting or diarrhea GU: no dysuria Integumentary: no rash  Allergy: no hives  Musculoskeletal: no leg swelling  Neurological: no slurred speech ROS otherwise negative  PAST MEDICAL HISTORY/PAST SURGICAL HISTORY:  Past Medical History:  Diagnosis Date  . Hypertension   . Obesity   . Ovarian cyst    left side  . Pregnancy induced hypertension   . Pregnant     MEDICATIONS:  Prior to Admission medications   Not on File    ALLERGIES:  Allergies  Allergen Reactions  . Lemon Juice Itching and Other (See Comments)    Tongue swelling  . Orange Juice Itching and Other (See Comments)    Tongue swelling  . Citrus Itching and Swelling    Tongue swelling  . Penicillins Itching  . Sulfa Antibiotics Itching  . Amoxicillin Rash  . Latex Itching and Rash    SOCIAL HISTORY:  Social History  Substance Use Topics  . Smoking status: Never Smoker  . Smokeless tobacco: Never Used  . Alcohol use No    FAMILY HISTORY: Family History  Problem Relation Age of Onset  . Diabetes Mother   . Diabetes Maternal Grandmother   . Diabetes Maternal Grandfather   . Diabetes Paternal Grandmother   . Stroke Paternal Grandmother     EXAM: BP (!) 144/85 (BP Location: Left Arm)   Pulse (!) 110   Temp 99.4 F (37.4 C) (Oral)   Resp 20   Ht  (1.727 m)   Wt (!) 384 lb (174.2 kg)   LMP 09/15/2016   SpO2  99%   BMI 58.39 kg/m  CONSTITUTIONAL: Alert and oriented and responds appropriately to questions. Well-appearing; well-nourished HEAD: Normocephalic EYES: Conjunctivae clear, pupils appear equal, EOMI ENT: normal nose; moist mucous membranes; No pharyngeal erythema or petechiae, no tonsillar hypertrophy or exudate, no uvular deviation, no unilateral swelling, no trismus or drooling, no muffled voice, normal phonation, no stridor, no dental caries present, no drainable dental abscess noted, no Ludwig's angina, tongue sits flat in the bottom of the mouth, no angioedema, no facial erythema or warmth, no facial swelling; no pain with movement of the neck. NECK: Supple, no meningismus, no nuchal rigidity, no LAD  CARD: Regular and minimally tachycardic; S1 and S2 appreciated; no murmurs, no clicks, no rubs, no gallops RESP: Normal chest excursion without splinting or tachypnea; breath sounds clear and equal bilaterally; no wheezes, no rhonchi, no rales, no hypoxia or respiratory distress, speaking full sentences ABD/GI: Normal bowel sounds; non-distended; soft, non-tender, no rebound, no guarding, no peritoneal signs, no hepatosplenomegaly BACK:  The back appears normal and is non-tender to palpation, there is no CVA tenderness EXT: Normal ROM in all joints; non-tender to palpation; no edema; normal capillary refill; no cyanosis, no calf tenderness or swelling    SKIN: Normal color for age and race; warm; no rash NEURO: Moves all extremities equally PSYCH: The patient's mood and  manner are appropriate. Grooming and personal hygiene are appropriate.  MEDICAL DECISION MAKING: Patient here with flulike symptoms. She is within the treatment window Tamiflu and she is requesting a prescription for the same. Have offered her chest x-ray to evaluate for possible pneumonia although I feel this is less likely given lungs are clear and she is not complaining of shortness of breath currently but she did complain of  shortness of breath earlier. Patient states she does not want a chest x-ray and is ready for discharge home. Discussed with her that I do not feel antibiotics are indicated and she is comfortable with this plan. We'll discharge with Tamiflu and Zofran. Discussed with her that Tamiflu can cause nausea, vomiting and diarrhea. We'll discharge with Zofran. Recommended alternating Tylenol and Motrin for fever and pain. Recommended over-the-counter medications for symptom control. Recommended rest, increase fluid intake. We'll discharge with work note. Patient is comfortable with this plan.  At this time, I do not feel there is any life-threatening condition present. I have reviewed and discussed all results (EKG, imaging, lab, urine as appropriate) and exam findings with patient/family. I have reviewed nursing notes and appropriate previous records.  I feel the patient is safe to be discharged home without further emergent workup and can continue workup as an outpatient as needed. Discussed usual and customary return precautions. Patient/family verbalize understanding and are comfortable with this plan.  Outpatient follow-up has been provided if needed. All questions have been answered.    EKG Interpretation  Date/Time:  Tuesday October 01 2016 21:29:18 EDT Ventricular Rate:  110 PR Interval:  138 QRS Duration: 66 QT Interval:  314 QTC Calculation: 424 R Axis:   41 Text Interpretation:  Sinus tachycardia Otherwise normal ECG Confirmed by Akeya Ryther,  DO, Nareh Matzke ([id number]) on 10/01/2016 11:58:10 PM         Kimberly Maw Vearl Allbaugh, DO 10/02/16 [id number]

## 2016-10-02 NOTE — Discharge Instructions (Signed)
You may alternate Tylenol 1000 mg every 6 hours as needed for pain and Ibuprofen 800 mg every 8 hours as needed for pain.  Please take Ibuprofen with food.   You may alternate Tylenol 1000 mg every 6 hours as needed for fever and pain and ibuprofen 800 mg every 8 hours as needed for fever and pain. Please rest and drink plenty of fluids. This is a viral illness causing your symptoms. You do not need antibiotics for a virus. You may use over-the-counter nasal saline spray and Afrin nasal saline spray as needed for nasal congestion. Please do not use Afrin for more than 3 days in a row. You may use Mucinex as needed for cough.  This may take 7-14 days to run its course.  We do not test for the flu from the emergency department as we do not have rapid flu swabs and it takes hours for this test to come back and it would not change our management. The flu is treated like any other virus with supportive measures as listed above.  Tamiflu has to be taken within the first 48 hours of symptoms.  Tamiflu has only been shown to shorten the course of the flu by 1 day. It does not make you feel better faster or making less contagious. It may make you have vomiting and diarrhea. You may use Imodium over-the-counter for diarrhea and we are discharging you with Zofran as needed for nausea.   To find a primary care or specialty doctor please call [phone] or [phone] to access "[facility] Find a Doctor Service."  You may also go on the [facility] website at [email or web address]  There are also multiple [facility], [facility], [facility] and [facility] practices throughout the [place] that are frequently accepting new patients. You may find a clinic that is close to your home and contact them.  [facility] -  [address] [place] [address] [phone]   [facility] Department -  [address] [place]  [address] [phone]   [facility] Department [phone]  [place] [address] [phone]

## 2016-10-17 ENCOUNTER — Encounter (HOSPITAL_COMMUNITY): Payer: Self-pay | Admitting: *Deleted

## 2016-10-17 ENCOUNTER — Inpatient Hospital Stay (HOSPITAL_COMMUNITY)
Admission: AD | Admit: 2016-10-17 | Discharge: 2016-10-18 | Disposition: A | Discharge status: Home / Self Care | Payer: Medicaid Other | Source: Ambulatory Visit | Attending: Obstetrics and Gynecology | Admitting: Obstetrics and Gynecology

## 2016-10-17 DIAGNOSIS — B3731 Acute candidiasis of vulva and vagina: Secondary | ICD-10-CM

## 2016-10-17 DIAGNOSIS — R102 Pelvic and perineal pain: Secondary | ICD-10-CM | POA: Diagnosis present

## 2016-10-17 DIAGNOSIS — B373 Candidiasis of vulva and vagina: Secondary | ICD-10-CM | POA: Diagnosis not present

## 2016-10-17 DIAGNOSIS — Z880 Allergy status to penicillin: Secondary | ICD-10-CM | POA: Insufficient documentation

## 2016-10-17 LAB — URINALYSIS, ROUTINE W REFLEX MICROSCOPIC
Bilirubin Urine: NEGATIVE
Glucose, UA: NEGATIVE mg/dL
KETONES UR: NEGATIVE mg/dL
Nitrite: NEGATIVE
PH: 5 (ref 5.0–8.0)
PROTEIN: 30 mg/dL — AB
Specific Gravity, Urine: 1.026 (ref 1.005–1.030)

## 2016-10-17 LAB — POCT PREGNANCY, URINE: PREG TEST UR: NEGATIVE

## 2016-10-17 LAB — WET PREP, GENITAL
Clue Cells Wet Prep HPF POC: NONE SEEN
Sperm: NONE SEEN
TRICH WET PREP: NONE SEEN

## 2016-10-17 MED ORDER — FLUCONAZOLE 150 MG PO TABS
150.0000 mg | ORAL_TABLET | Freq: Once | ORAL | Status: AC
  Administered 2016-10-17: 150 mg via ORAL
  Filled 2016-10-17: qty 1

## 2016-10-17 MED ORDER — FLUCONAZOLE 150 MG PO TABS: 150.0000 mg | ORAL_TABLET | Freq: Once | ORAL | 0 refills | Status: AC

## 2016-10-17 NOTE — Discharge Instructions (Signed)

## 2016-10-17 NOTE — Progress Notes (Signed)
Presents to triage for vaginal itching x2 days and progressively getting worse. States bleeding started today. Last intercourse yesterday. Mensis April 27 lasting 2 days only. States heavy

## 2016-10-17 NOTE — MAU Note (Signed)
PT  SAYS   SHE HAS VAG DISCOMFORT - WITH ITCHING - HAS SCRATCHED  AND  PERINEUM IS  BLEEDING   AND  FEELS  SOMETHING  HANGING  OUT..   NO BIRTH CONTROL.       LAST SEX- YESTERDAY    BTL- 2016

## 2016-10-17 NOTE — MAU Provider Note (Signed)
History     CSN: [id number]  Arrival date and time: 10/17/16 2209   First Provider Initiated Contact with Patient 10/17/16 2320      Chief Complaint  Patient presents with  . Vaginal Pain   Vaginal Pain  The patient's primary symptoms include genital itching, a genital rash and vaginal discharge. This is a new problem. Episode onset: 2 days ago. The problem occurs constantly. The problem has been gradually worsening. The pain is moderate. The problem affects both sides. She is not pregnant. Pertinent negatives include no chills, dysuria, fever, frequency, nausea, urgency or vomiting. The vaginal discharge was bloody. The vaginal bleeding is spotting (She feels this is from an area that she scratched and not uterine bleeding. ). She has not been passing clots. She has not been passing tissue. Nothing aggravates the symptoms. She has tried nothing for the symptoms. She uses tubal ligation for contraception. Menstrual history: LMP 10/11/16     Past Medical History:  Diagnosis Date  . Hypertension   . Obesity   . Ovarian cyst    left side  . Pregnancy induced hypertension   . Pregnant     Past Surgical History:  Procedure Laterality Date  . CESAREAN SECTION  04/09/2011   Procedure: CESAREAN SECTION;  Surgeon: Loney LaurenceMichelle A Horvath;  Location: [facility];  Service: Gynecology;  Laterality: N/A;  . CESAREAN SECTION N/A 07/21/2014   Procedure: CESAREAN SECTION;  Surgeon: Freddrick MarchKendra H. Tenny Crawoss, MD;  Location: [facility];  Service: Obstetrics;  Laterality: N/A;  . DILATION AND CURETTAGE OF UTERUS    . KNEE ARTHROSCOPY    . TONSILLECTOMY    . TUBAL LIGATION    . WISDOM TOOTH EXTRACTION      Family History  Problem Relation Age of Onset  . Diabetes Mother   . Diabetes Maternal Grandmother   . Diabetes Maternal Grandfather   . Diabetes Paternal Grandmother   . Stroke Paternal Grandmother     Social History  Substance Use Topics  . Smoking status: Never Smoker  . Smokeless tobacco: Never Used  .  Alcohol use No    Allergies:  Allergies  Allergen Reactions  . Lemon Juice Itching and Other (See Comments)    Tongue swelling  . Orange Juice Itching and Other (See Comments)    Tongue swelling  . Citrus Itching and Swelling    Tongue swelling  . Penicillins Itching  . Sulfa Antibiotics Itching  . Amoxicillin Rash  . Latex Itching and Rash    Prescriptions Prior to Admission  Medication Sig Dispense Refill Last Dose  . ondansetron (ZOFRAN ODT) 4 MG disintegrating tablet Take 1 tablet (4 mg total) by mouth every 8 (eight) hours as needed for nausea or vomiting. 20 tablet 0 Past Month at Unknown time  . oseltamivir (TAMIFLU) 75 MG capsule Take 1 capsule (75 mg total) by mouth every 12 (twelve) hours. 10 capsule 0 Past Month at Unknown time    Review of Systems  Constitutional: Negative for chills and fever.  Gastrointestinal: Negative for nausea and vomiting.  Genitourinary: Positive for vaginal discharge and vaginal pain. Negative for dysuria, frequency and urgency.   Physical Exam   Blood pressure (!) 127/44, pulse 80, temperature 98.2 F (36.8 C), temperature source Oral, resp. rate 20, height 5\' 8"  (1.727 m), weight (!) 387 lb 4 oz (175.7 kg), last menstrual period 10/11/2016, unknown if currently breastfeeding.  Physical Exam  Nursing note and vitals reviewed. Constitutional: She is oriented to person, place, and time.  She appears well-developed and well-nourished. No distress.  HENT:  Head: Normocephalic.  Cardiovascular: Normal rate.   Respiratory: Effort normal.  GI: Soft. There is no tenderness. There is no rebound.  Genitourinary:  Genitourinary Comments: External: small excoriated area no the left labia major  Vagina: large amount of white discharge noted.     Neurological: She is alert and oriented to person, place, and time.  Skin: Skin is warm and dry.  Psychiatric: She has a normal mood and affect.   Results for orders placed or performed during the  hospital encounter of 10/17/16 (from the past 24 hour(s))  Urinalysis, Routine w reflex microscopic     Status: Abnormal   Collection Time: 10/17/16 10:25 PM  Result Value Ref Range   Color, Urine AMBER (A) YELLOW   APPearance CLEAR CLEAR   Specific Gravity, Urine 1.026 1.005 - 1.030   pH 5.0 5.0 - 8.0   Glucose, UA NEGATIVE NEGATIVE mg/dL   Hgb urine dipstick MODERATE (A) NEGATIVE   Bilirubin Urine NEGATIVE NEGATIVE   Ketones, ur NEGATIVE NEGATIVE mg/dL   Protein, ur 30 (A) NEGATIVE mg/dL   Nitrite NEGATIVE NEGATIVE   Leukocytes, UA LARGE (A) NEGATIVE   RBC / HPF 0-5 0 - 5 RBC/hpf   WBC, UA 6-30 0 - 5 WBC/hpf   Bacteria, UA FEW (A) NONE SEEN   Squamous Epithelial / LPF 6-30 (A) NONE SEEN   Mucous PRESENT   Pregnancy, urine POC     Status: None   Collection Time: 10/17/16 10:34 PM  Result Value Ref Range   Preg Test, Ur NEGATIVE NEGATIVE  Wet prep, genital     Status: Abnormal   Collection Time: 10/17/16 11:20 PM  Result Value Ref Range   Yeast Wet Prep HPF POC PRESENT (A) NONE SEEN   Trich, Wet Prep NONE SEEN NONE SEEN   Clue Cells Wet Prep HPF POC NONE SEEN NONE SEEN   WBC, Wet Prep HPF POC MANY (A) NONE SEEN   Sperm NONE SEEN     MAU Course  Procedures  MDM   Assessment and Plan   1. Yeast infection involving the vagina and surrounding area    DC home Comfort measures reviewed  RX: diflucan x1  Return to MAU as needed  Follow-up Information    [facility] Follow up.   Contact information: [address] [place] [address] [phone]            Thressa Sheller 10/17/2016, 11:22 PM

## 2016-10-18 LAB — GC/CHLAMYDIA PROBE AMP (~~LOC~~) NOT AT ARMC
Chlamydia: NEGATIVE
NEISSERIA GONORRHEA: NEGATIVE

## 2016-11-18 ENCOUNTER — Other Ambulatory Visit: Payer: Self-pay | Admitting: Obstetrics & Gynecology

## 2016-11-19 LAB — CYTOLOGY - PAP

## 2016-11-25 ENCOUNTER — Other Ambulatory Visit: Payer: Self-pay | Admitting: Obstetrics & Gynecology

## 2016-11-25 DIAGNOSIS — N643 Galactorrhea not associated with childbirth: Secondary | ICD-10-CM

## 2016-12-22 ENCOUNTER — Encounter (HOSPITAL_COMMUNITY): Payer: Self-pay

## 2016-12-22 ENCOUNTER — Emergency Department (HOSPITAL_COMMUNITY): Payer: Medicaid Other

## 2016-12-22 ENCOUNTER — Emergency Department (HOSPITAL_COMMUNITY)
Admission: EM | Admit: 2016-12-22 | Discharge: 2016-12-22 | Disposition: A | Discharge status: Home / Self Care | Payer: Medicaid Other | Attending: Emergency Medicine | Admitting: Emergency Medicine

## 2016-12-22 DIAGNOSIS — M76892 Other specified enthesopathies of left lower limb, excluding foot: Secondary | ICD-10-CM | POA: Diagnosis not present

## 2016-12-22 DIAGNOSIS — Z91040 Latex allergy status: Secondary | ICD-10-CM | POA: Insufficient documentation

## 2016-12-22 DIAGNOSIS — M25552 Pain in left hip: Secondary | ICD-10-CM | POA: Insufficient documentation

## 2016-12-22 DIAGNOSIS — I10 Essential (primary) hypertension: Secondary | ICD-10-CM | POA: Diagnosis not present

## 2016-12-22 LAB — URINALYSIS, ROUTINE W REFLEX MICROSCOPIC
Bilirubin Urine: NEGATIVE
Glucose, UA: NEGATIVE mg/dL
Hgb urine dipstick: NEGATIVE
Ketones, ur: NEGATIVE mg/dL
Leukocytes, UA: NEGATIVE
NITRITE: NEGATIVE
PROTEIN: NEGATIVE mg/dL
SPECIFIC GRAVITY, URINE: 1.023 (ref 1.005–1.030)
pH: 5 (ref 5.0–8.0)

## 2016-12-22 MED ORDER — MELOXICAM 15 MG PO TABS: 15.0000 mg | ORAL_TABLET | Freq: Every day | ORAL | 0 refills | Status: DC

## 2016-12-22 MED ORDER — MELOXICAM 15 MG PO TABS
15.0000 mg | ORAL_TABLET | Freq: Once | ORAL | Status: AC
  Administered 2016-12-22: 15 mg via ORAL
  Filled 2016-12-22: qty 1

## 2016-12-22 NOTE — ED Provider Notes (Signed)
[facility]-EMERGENCY DEPT Provider Note   CSN: [id number] Arrival date & time: 12/22/16  1803      History   Chief Complaint Chief Complaint  Patient presents with  . Back Pain  . Flank Pain    HPI Kimberly [name] is a 26 y.o. female with a PMHx of HTN, L ovarian cyst, and obesity, and PSHx of tubal ligation, who presents to the ED with complaints of left hip pain that has gradually worsened over the last 3 weeks (note: triage notes stated it's L flank pain, however pt pointing at her L hip, denies flank pain). 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 She denies any known injury or trauma to the leg/hip, she's not sure what has caused her hip to start hurting. She describes the pain as 10/10 constant sharp nonradiating left hip pain, which worsens with laying on her left side and any movement of the left hip, and has been unrelieved with heat, Tylenol, and over-the-counter muscle rub. She denies any fevers, chills, CP, SOB, abd pain, N/V/D/C, melena, hematochezia, hematuria, dysuria, urinary frequency, flank pain, vaginal bleeding/discharge, myalgias, numbness, tingling, focal weakness, or any other complaints at this time. No swelling to the area. Denies hx of kidney stones. Of note, chart review reveals she was seen in the ED 03/2016 for very similar complaints, no xrays done, sent home with robaxin and naprosyn.    The history is provided by the patient and medical records. No language interpreter was used.  Hip Pain  This is a recurrent problem. The current episode started more than 1 week ago. The problem occurs constantly. The problem has been gradually worsening. Pertinent negatives include no chest pain, no abdominal pain and no shortness of breath. Exacerbated by: movement and laying on L side. Nothing relieves the symptoms. She has tried a warm compress (and muscle rub and tylenol)  for the symptoms. The treatment provided no relief.    Past Medical History:  Diagnosis Date  . Hypertension   . Obesity   . Ovarian cyst    left side  . Pregnancy induced hypertension   . Pregnant     Patient Active Problem List   Diagnosis Date Noted  . Parapneumonic effusion 07/26/2014  . HTN (hypertension) 07/26/2014  . Hypokalemia 07/26/2014  . Leukocytosis 07/26/2014  . Healthcare-associated pneumonia   . Maternal morbid obesity, delivered, current hospitalization (HCC) 07/21/2014  . PIH (pregnancy induced hypertension) 07/19/2014  . Maternal morbid obesity, antepartum (HCC)   . Prior pregnancy complicated by IUGR, antepartum   . Hx of preeclampsia, prior pregnancy, currently pregnant     Past Surgical History:  Procedure Laterality Date  . CESAREAN SECTION  04/09/2011   Procedure: CESAREAN SECTION;  Surgeon: Loney Laurence;  Location: [facility];  Service: Gynecology;  Laterality: N/A;  . CESAREAN SECTION N/A 07/21/2014   Procedure: CESAREAN SECTION;  Surgeon: Freddrick March. Tenny Craw, MD;  Location: [facility];  Service: Obstetrics;  Laterality: N/A;  . DILATION AND CURETTAGE OF UTERUS    . KNEE ARTHROSCOPY    . TONSILLECTOMY    . TUBAL LIGATION    . WISDOM TOOTH EXTRACTION      OB History    Gravida Para Term Preterm AB Living   5 2 1 1 3 2    SAB TAB Ectopic Multiple Live Births   1 1 1  0 2  Home Medications    Prior to Admission medications   Medication Sig Start Date End Date Taking? Authorizing Provider  ondansetron (ZOFRAN ODT) 4 MG disintegrating tablet Take 1 tablet (4 mg total) by mouth every 8 (eight) hours as needed for nausea or vomiting. 10/02/16   Ward, Layla Maw, DO  oseltamivir (TAMIFLU) 75 MG capsule Take 1 capsule (75 mg total) by mouth every 12 (twelve) hours. 10/02/16   Ward, Layla Maw, DO    Family History Family History  Problem Relation Age of Onset  . Diabetes Mother   . Diabetes Maternal Grandmother   . Diabetes Maternal Grandfather     . Diabetes Paternal Grandmother   . Stroke Paternal Grandmother     Social History Social History  Substance Use Topics  . Smoking status: Never Smoker  . Smokeless tobacco: Never Used  . Alcohol use No     Allergies   Lemon juice; Orange juice; Citrus; Penicillins; Sulfa antibiotics; Amoxicillin; and Latex   Review of Systems Review of Systems  Constitutional: Negative for chills and fever.  Respiratory: Negative for shortness of breath.   Cardiovascular: Negative for chest pain.  Gastrointestinal: Negative for abdominal pain, blood in stool, constipation, diarrhea, nausea and vomiting.  Genitourinary: Negative for dysuria, flank pain, frequency, hematuria, vaginal bleeding and vaginal discharge.  Musculoskeletal: Positive for arthralgias (L hip) and back pain. Negative for joint swelling and myalgias.  Skin: Negative for color change.  Allergic/Immunologic: Negative for immunocompromised state.  Neurological: Negative for weakness and numbness.  Psychiatric/Behavioral: Negative for confusion.   All other systems reviewed and are negative for acute change except as noted in the HPI.    Physical Exam Updated Vital Signs BP 134/76 (BP Location: Left Arm)   Pulse 84   Temp 98.3 F (36.8 C) (Oral)   Resp 18   SpO2 100%   Physical Exam  Constitutional: She is oriented to person, place, and time. Vital signs are normal. She appears well-developed and well-nourished.  Non-toxic appearance. No distress.  Afebrile, nontoxic, NAD, morbidly obese  HENT:  Head: Normocephalic and atraumatic.  Mouth/Throat: Oropharynx is clear and moist and mucous membranes are normal.  Eyes: Conjunctivae and EOM are normal. Right eye exhibits no discharge. Left eye exhibits no discharge.  Neck: Normal range of motion. Neck supple.  Cardiovascular: Normal rate, regular rhythm, normal heart sounds and intact distal pulses.  Exam reveals no gallop and no friction rub.   No murmur  heard. Pulmonary/Chest: Effort normal and breath sounds normal. No respiratory distress. She has no decreased breath sounds. She has no wheezes. She has no rhonchi. She has no rales.  Abdominal: Soft. Normal appearance and bowel sounds are normal. She exhibits no distension. There is no tenderness. There is no rigidity, no rebound, no guarding, no CVA tenderness, no tenderness at McBurney's point and negative Murphy's sign.  Soft, obese but nondistended, nontender, +BS throughout, no r/g/r, neg murphy's, neg mcburney's, no CVA TTP   Musculoskeletal: Normal range of motion.       Left hip: She exhibits tenderness and bony tenderness. She exhibits normal range of motion, normal strength, no swelling, no crepitus and no deformity.  L hip with FROM intact, with diffuse lateral joint line TTP particularly over hip flexor musculature, no bruising or swelling appreciated, no crepitus or deformity, no limb length discrepancy or abnormal rotation. No pain with log roll testing. Strength and sensation grossly intact, distal pulses intact, compartments soft.  All spinal levels nonTTP without bony stepoffs or  deformities   Neurological: She is alert and oriented to person, place, and time. She has normal strength. No sensory deficit.  Skin: Skin is warm, dry and intact. No rash noted.  Psychiatric: She has a normal mood and affect.  Nursing note and vitals reviewed.    ED Treatments / Results  Labs (all labs ordered are listed, but only abnormal results are displayed) Labs Reviewed  URINALYSIS, ROUTINE W REFLEX MICROSCOPIC - Abnormal; Notable for the following:       Result Value   APPearance HAZY (*)    All other components within normal limits    EKG  EKG Interpretation None       Radiology Dg Hip Unilat W Or Wo Pelvis 2-3 Views Left  Result Date: 12/22/2016 CLINICAL DATA:  Pain EXAM: DG HIP (WITH OR WITHOUT PELVIS) 2-3V LEFT COMPARISON:  None. FINDINGS: Frontal pelvis as well as frontal and  lateral left hip images were obtained. No fracture or dislocation. Joint spaces appear normal. No erosive change. There are tubal ligation clips bilaterally. IMPRESSION: No fracture or dislocation.  No evident arthropathic change. Electronically Signed   By: Bretta Bang III M.D.   On: 12/22/2016 20:42    Procedures Procedures (including critical care time)  Medications Ordered in ED Medications  meloxicam (MOBIC) tablet 15 mg (15 mg Oral Given 12/22/16 2034)     Initial Impression / Assessment and Plan / ED Course  I have reviewed the triage vital signs and the nursing notes.  Pertinent labs & imaging results that were available during Kimberly care of the patient were reviewed by me and considered in Kimberly medical decision making (see chart for details).     26 y.o. female here with L hip pain x3wks. On exam, tenderness to L lateral joint line of hip, no midline spinal tenderness, no flank or abdominal tenderness. No urinary complaints. Doesn't seem like this is urinary/flank in nature, she's actually pointing at her hip. NVI with soft compartments, FROM intact. Pt was seen in 03/2016 for similar complaints, no imaging done at that visit, but sent home on robaxin and naprosyn at that time. U/A obtained in triage, and is completely unremarkable. Highly doubt urinary source. Will obtain xray of hip to eval for hip impingement vs arthritis. Will give mobic then reassess shortly.   9:16 PM Xray without acute findings or definite arthropathy; on Kimberly eval of the images, possible cam/pincer deformity seen, but hard to tell due to body habitus limited xray imaging slightly. Could be hip flexor tendinopathy vs ?hip impingement. Advised heat/ice use, will start on mobic, discussed use of tylenol, and f/up with ortho in 1-2wks for recheck and ongoing management of hip pain. Weight loss strongly encouraged. Discussed stretching at home as well. I explained the diagnosis and have given explicit precautions to  return to the ER including for any other new or worsening symptoms. The patient understands and accepts the medical plan as it's been dictated and I have answered their questions. Discharge instructions concerning home care and prescriptions have been given. The patient is STABLE and is discharged to home in good condition.    Final Clinical Impressions(s) / ED Diagnoses   Final diagnoses:  Left hip pain  Tendinitis of left hip flexor    New Prescriptions New Prescriptions   MELOXICAM (MOBIC) 15 MG TABLET    Take 1 tablet (15 mg total) by mouth daily. TAKE WITH [address] [place], [place] 12/22/16 2122    Little,  Ambrose Finlandachel Morgan, MD 12/23/16 [phone]

## 2016-12-22 NOTE — ED Triage Notes (Signed)
Pt with back and left flank pain x 3 weeks.  No fever. Some decreased in urination.

## 2016-12-22 NOTE — Discharge Instructions (Signed)
Alternate between ice and heat to the area of pain, using ice/heat pack for no more than 20 minutes every hour for each. Gently stretch the area to help improve muscle tightness. Use mobic daily as directed, don't use additional NSAIDs like ibuprofen/aleve/etc while taking mobic. Use additional tylenol as needed for additional pain relief. Try to lose weight if possible, which will help with joint pain in general. Call orthopedic follow up today or tomorrow to schedule followup appointment for recheck of ongoing hip pain in 1-2 weeks. Return to the ER for changes or worsening symptoms.

## 2016-12-26 ENCOUNTER — Ambulatory Visit (INDEPENDENT_AMBULATORY_CARE_PROVIDER_SITE_OTHER): Payer: Medicaid Other | Admitting: Orthopedic Surgery

## 2016-12-26 ENCOUNTER — Encounter (INDEPENDENT_AMBULATORY_CARE_PROVIDER_SITE_OTHER): Payer: Self-pay | Admitting: Orthopedic Surgery

## 2016-12-26 DIAGNOSIS — M25552 Pain in left hip: Secondary | ICD-10-CM | POA: Diagnosis not present

## 2016-12-26 MED ORDER — METHOCARBAMOL 500 MG PO TABS: 500.0000 mg | ORAL_TABLET | Freq: Three times a day (TID) | ORAL | 0 refills | Status: DC | PRN

## 2016-12-26 MED ORDER — ACETAMINOPHEN-CODEINE #3 300-30 MG PO TABS: ORAL_TABLET | ORAL | 0 refills | Status: DC

## 2016-12-26 MED ORDER — METHYLPREDNISOLONE 4 MG PO TABS: ORAL_TABLET | ORAL | 0 refills | Status: DC

## 2016-12-26 NOTE — Progress Notes (Signed)
Office Visit Note   Patient: Kimberly [name]           Date of Birth: 30-Apr-1991           MRN: [id number] Visit Date: 12/26/2016 Requested by: No referring provider defined for this encounter. PCP: Patient, No Pcp Per  Subjective: Chief Complaint  Patient presents with  . Left Hip - Pain    HPI: Kimberly [name] is a 26 year old patient with a four-week history of relatively incapacitating left hip pain.  Reports radicular pain running down the back of her leg.  She also describes some groin pain but no numbness and tingling.  She describes waking up with pain.  She went to the emergency room.  Hip x-rays were normal.  The visualized lumbar spine also normal.  She describes some left leg weakness when going up and down the stairs.  She has been able to work.  is not been helpful.  Hurts her to lift things.  She states that all this started with a limp.              ROS: All systems reviewed are negative as they relate to the chief complaint within the history of present illness.  Patient denies  fevers or chills.   Assessment & Plan: Visit Diagnoses:  1. Pain of left hip joint     Plan: Impression is left hip pain which appears to be radicular in nature.  Hip examination is normal.  She sits like she has a ruptured disc.  She has some weakness as well as some paresthesias on exam.  Plan is 6 day Medrol Dosepak Robaxin Tylenol 3 to take at night MRI lumbar spine to evaluate left-sided radiculopathy with weakness  Follow-Up Instructions: Return for after MRI.   Orders:  Orders Placed This Encounter  Procedures  . MR Lumbar Spine w/o contrast   Meds ordered this encounter  Medications  . methylPREDNISolone (MEDROL) 4 MG tablet    Sig: Medrol dosepak take as directed    Dispense:  21 tablet    Refill:  0  . methocarbamol (ROBAXIN) 500 MG tablet    Sig: Take 1 tablet (500 mg total) by mouth every 8 (eight) hours as needed for muscle spasms.    Dispense:  30 tablet    Refill:  0  .  acetaminophen-codeine (TYLENOL #3) 300-30 MG tablet    Sig: 1 po QHS prn pain    Dispense:  25 tablet    Refill:  0      Procedures: No procedures performed   Clinical Data: No additional findings.  Objective: Vital Signs: LMP 11/27/2016 (Within Days) Comment: hx of tubal  Physical Exam:   Constitutional: Patient appears well-developed HEENT:  Head: Normocephalic Eyes:EOM are normal Neck: Normal range of motion Cardiovascular: Normal rate Pulmonary/chest: Effort normal Neurologic: Patient is alert Skin: Skin is warm Psychiatric: Patient has normal mood and affect    Ortho Exam: Orthopedic exam demonstrates antalgic gait to the left.  When the patient sits her left buttock is elevated off of the table which is consistent with what I have seen in other patients with back pathology.  There is no groin pain with internal/external rotation of the leg.  No other masses lymph adenopathy or skin changes noted in the back or hip region.  Ankle dorsiflexion plantar flexion quite hamstring strength slightly weaker on the left compared to the right.  Does have paresthesias on the medial lateral aspect of the leg on the left  compared to the right.  Negative Babinski negative clonus reflexes symmetric patella and Achilles 1+ out of 4.  Specialty Comments:  No specialty comments available.  Imaging: No results found.   PMFS History: Patient Active Problem List   Diagnosis Date Noted  . Parapneumonic effusion 07/26/2014  . HTN (hypertension) 07/26/2014  . Hypokalemia 07/26/2014  . Leukocytosis 07/26/2014  . Healthcare-associated pneumonia   . Maternal morbid obesity, delivered, current hospitalization (HCC) 07/21/2014  . PIH (pregnancy induced hypertension) 07/19/2014  . Maternal morbid obesity, antepartum (HCC)   . Prior pregnancy complicated by IUGR, antepartum   . Hx of preeclampsia, prior pregnancy, currently pregnant    Past Medical History:  Diagnosis Date  .  Hypertension   . Obesity   . Ovarian cyst    left side  . Pregnancy induced hypertension   . Pregnant     Family History  Problem Relation Age of Onset  . Diabetes Mother   . Diabetes Maternal Grandmother   . Diabetes Maternal Grandfather   . Diabetes Paternal Grandmother   . Stroke Paternal Grandmother     Past Surgical History:  Procedure Laterality Date  . CESAREAN SECTION  04/09/2011   Procedure: CESAREAN SECTION;  Surgeon: Loney LaurenceMichelle A Horvath;  Location: [facility];  Service: Gynecology;  Laterality: N/A;  . CESAREAN SECTION N/A 07/21/2014   Procedure: CESAREAN SECTION;  Surgeon: Freddrick MarchKendra H. Tenny Crawoss, MD;  Location: [facility];  Service: Obstetrics;  Laterality: N/A;  . DILATION AND CURETTAGE OF UTERUS    . KNEE ARTHROSCOPY    . TONSILLECTOMY    . TUBAL LIGATION    . WISDOM TOOTH EXTRACTION     Social History   Occupational History  . Not on file.   Social History Main Topics  . Smoking status: Never Smoker  . Smokeless tobacco: Never Used  . Alcohol use No  . Drug use: No  . Sexual activity: Not on file

## 2017-01-11 ENCOUNTER — Ambulatory Visit
Admission: RE | Admit: 2017-01-11 | Discharge: 2017-01-11 | Disposition: A | Discharge status: Home / Self Care | Payer: Medicaid Other | Source: Ambulatory Visit | Attending: Orthopedic Surgery | Admitting: Orthopedic Surgery

## 2017-01-11 DIAGNOSIS — M25552 Pain in left hip: Secondary | ICD-10-CM

## 2017-01-15 ENCOUNTER — Encounter (INDEPENDENT_AMBULATORY_CARE_PROVIDER_SITE_OTHER): Payer: Self-pay | Admitting: Orthopedic Surgery

## 2017-01-15 ENCOUNTER — Ambulatory Visit (INDEPENDENT_AMBULATORY_CARE_PROVIDER_SITE_OTHER): Payer: Medicaid Other | Admitting: Orthopedic Surgery

## 2017-01-15 DIAGNOSIS — M545 Low back pain: Secondary | ICD-10-CM

## 2017-01-15 NOTE — Progress Notes (Signed)
Office Visit Note   Patient: Kimberly [name]           Date of Birth: 04/11/1991           MRN: [id number] Visit Date: 01/15/2017 Requested by: No referring provider defined for this encounter. PCP: Patient, No Pcp Per  Subjective: Chief Complaint  Patient presents with  . Lower Back - Follow-up    HPI: Kimberly [name] is a 26 year old patient with pre-severe low back pain.  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  She is tender on this side and has some occasional radiating left leg pain.  She is taking some over-the-counter medications and has difficulty completing her activities of daily living including work              ROS: All systems reviewed are negative as they relate to the chief complaint within the history of present illness.  Patient denies  fevers or chills.   Assessment & Plan: Visit Diagnoses:  1. Low back pain, unspecified back pain laterality, unspecified chronicity, with sciatica presence unspecified     Plan: Impression is low back pain with facet arthritis and effusion.  Plan is referral to Dr. Alvester MorinNewton for facet joint injection.  I think that'll help her.  Physical conditioning with also be beneficial.  Follow-up with me as needed.  Follow-Up Instructions: No Follow-up on file.   Orders:  Orders Placed This Encounter  Procedures  . Ambulatory referral to Physical Medicine Rehab   No orders of the defined types were placed in this encounter.     Procedures: No procedures performed   Clinical Data: No additional findings.  Objective: Vital Signs: There were no vitals taken for this visit.  Physical Exam:   Constitutional: Patient appears well-developed HEENT:  Head: Normocephalic Eyes:EOM are normal Neck: Normal range of motion Cardiovascular: Normal rate Pulmonary/chest: Effort normal Neurologic: Patient is alert Skin: Skin is warm Psychiatric: Patient has normal mood and  affect    Ortho Exam: Orthopedic exam demonstrates normal gait and alignment with some difficulty going from sitting to standing.  Extension hurts her back more than flexion.  No nerve root tension signs.  The rest of her examination is unchanged.  Specialty Comments:  No specialty comments available.  Imaging: No results found.   PMFS History: Patient Active Problem List   Diagnosis Date Noted  . Parapneumonic effusion 07/26/2014  . HTN (hypertension) 07/26/2014  . Hypokalemia 07/26/2014  . Leukocytosis 07/26/2014  . Healthcare-associated pneumonia   . Maternal morbid obesity, delivered, current hospitalization (HCC) 07/21/2014  . PIH (pregnancy induced hypertension) 07/19/2014  . Maternal morbid obesity, antepartum (HCC)   . Prior pregnancy complicated by IUGR, antepartum   . Hx of preeclampsia, prior pregnancy, currently pregnant    Past Medical History:  Diagnosis Date  . Hypertension   . Obesity   . Ovarian cyst    left side  . Pregnancy induced hypertension   . Pregnant     Family History  Problem Relation Age of Onset  . Diabetes Mother   . Diabetes Maternal Grandmother   . Diabetes Maternal Grandfather   . Diabetes Paternal Grandmother   . Stroke Paternal Grandmother     Past Surgical History:  Procedure Laterality Date  . CESAREAN SECTION  04/09/2011   Procedure: CESAREAN SECTION;  Surgeon: Loney LaurenceMichelle A Horvath;  Location: [facility];  Service: Gynecology;  Laterality: N/A;  . CESAREAN SECTION N/A 07/21/2014  Procedure: CESAREAN SECTION;  Surgeon: Freddrick MarchKendra H. Tenny Crawoss, MD;  Location: [facility];  Service: Obstetrics;  Laterality: N/A;  . DILATION AND CURETTAGE OF UTERUS    . KNEE ARTHROSCOPY    . TONSILLECTOMY    . TUBAL LIGATION    . WISDOM TOOTH EXTRACTION     Social History   Occupational History  . Not on file.   Social History Main Topics  . Smoking status: Never Smoker  . Smokeless tobacco: Never Used  . Alcohol use No  . Drug use: No  . Sexual  activity: Not on file

## 2017-02-05 ENCOUNTER — Ambulatory Visit (INDEPENDENT_AMBULATORY_CARE_PROVIDER_SITE_OTHER): Payer: Medicaid Other | Admitting: Physical Medicine and Rehabilitation

## 2017-02-05 ENCOUNTER — Ambulatory Visit (INDEPENDENT_AMBULATORY_CARE_PROVIDER_SITE_OTHER): Payer: Self-pay

## 2017-02-05 ENCOUNTER — Encounter (INDEPENDENT_AMBULATORY_CARE_PROVIDER_SITE_OTHER): Payer: Self-pay | Admitting: Physical Medicine and Rehabilitation

## 2017-02-05 VITALS — BP 139/82 | HR 88

## 2017-02-05 DIAGNOSIS — M47816 Spondylosis without myelopathy or radiculopathy, lumbar region: Secondary | ICD-10-CM

## 2017-02-05 MED ORDER — METHYLPREDNISOLONE ACETATE 80 MG/ML IJ SUSP
80.0000 mg | Freq: Once | INTRAMUSCULAR | Status: AC
  Administered 2017-02-05: 80 mg

## 2017-02-05 MED ORDER — LIDOCAINE HCL (PF) 1 % IJ SOLN: 2.0000 mL | Freq: Once | INTRAMUSCULAR | Status: DC

## 2017-02-05 NOTE — Progress Notes (Deleted)
Pain across back and worse on left side into hip. Pain down left leg to foot. Numbness at times. Pain worse with sitting long periods and going up and down steps.

## 2017-02-05 NOTE — Patient Instructions (Signed)

## 2017-02-07 NOTE — Procedures (Signed)
Ms. Unthank is a 26 year old female followed by Dr. August Saucer. She's been having chronic worsening left low back pain in the lateral hip and down the leg to the foot. She does report numbness at times but bilaterally. She unfortunately is morbidly obese. She has had other issues with her other joints. We actually see her father in the office at times. She has MRI evidence of facet arthropathy at L4-5 bilaterally. She has no disc herniations or focal stenosis or nerve compression. Within a complete a diagnostic of therapeutic left L4-5 facet joint block. This could be facet joint syndrome.  Lumbar Facet Joint Intra-Articular Injection(s) with Fluoroscopic Guidance  Patient: Kimberly [name]      Date of Birth: 1990/08/26 MRN: [id number] PCP: Patient, No Pcp Per      Visit Date: 02/05/2017   Universal Protocol:    Date/Time: 02/05/2017  Consent Given By: the patient  Position: PRONE   Additional Comments: Vital signs were monitored before and after the procedure. Patient was prepped and draped in the usual sterile fashion. The correct patient, procedure, and site was verified.   Injection Procedure Details:  Procedure Site One Meds Administered:  Meds ordered this encounter  Medications  . lidocaine (PF) (XYLOCAINE) 1 % injection 2 mL  . methylPREDNISolone acetate (DEPO-MEDROL) injection 80 mg     Laterality: Left  Location/Site:  L4-L5  Needle size: 22 guage  Needle type: Spinal  Needle Placement: Articular  Findings:  -Contrast Used: 1 mL iohexol 180 mg iodine/mL   -Comments: Excellent flow of contrast producing a partial arthrogram.  Procedure Details: The fluoroscope beam is vertically oriented in AP, and the inferior recess is visualized beneath the lower pole of the inferior apophyseal process, which represents the target point for needle insertion. When direct visualization is difficult the target point is located at the medial projection of the vertebral pedicle. The  region overlying each aforementioned target is locally anesthetized with a 1 to 2 ml. volume of 1% Lidocaine without Epinephrine.   The spinal needle was inserted into each of the above mentioned facet joints using biplanar fluoroscopic guidance. A 0.25 to 0.5 ml. volume of Isovue-250 was injected and a partial facet joint arthrogram was obtained. A single spot film was obtained of the resulting arthrogram.    One to 1.25 ml of the steroid/anesthetic solution was then injected into each of the facet joints noted above.   Additional Comments:  The patient tolerated the procedure well Dressing: Band-Aid    Post-procedure details: Patient was observed during the procedure. Post-procedure instructions were reviewed.  Patient left the clinic in stable condition.

## 2017-02-26 ENCOUNTER — Other Ambulatory Visit: Payer: Self-pay

## 2017-04-23 ENCOUNTER — Ambulatory Visit
Admission: RE | Admit: 2017-04-23 | Discharge: 2017-04-23 | Disposition: A | Discharge status: Home / Self Care | Payer: BLUE CROSS/BLUE SHIELD | Source: Ambulatory Visit | Attending: Obstetrics & Gynecology | Admitting: Obstetrics & Gynecology

## 2017-04-23 ENCOUNTER — Other Ambulatory Visit: Payer: Self-pay | Admitting: Obstetrics & Gynecology

## 2017-04-23 DIAGNOSIS — N643 Galactorrhea not associated with childbirth: Secondary | ICD-10-CM

## 2017-11-13 ENCOUNTER — Emergency Department (HOSPITAL_BASED_OUTPATIENT_CLINIC_OR_DEPARTMENT_OTHER)
Admission: EM | Admit: 2017-11-13 | Discharge: 2017-11-14 | Disposition: A | Discharge status: Home / Self Care | Payer: Commercial Managed Care - PPO | Attending: Emergency Medicine | Admitting: Emergency Medicine

## 2017-11-13 ENCOUNTER — Encounter (HOSPITAL_BASED_OUTPATIENT_CLINIC_OR_DEPARTMENT_OTHER): Payer: Self-pay

## 2017-11-13 ENCOUNTER — Other Ambulatory Visit: Payer: Self-pay

## 2017-11-13 DIAGNOSIS — R220 Localized swelling, mass and lump, head: Secondary | ICD-10-CM | POA: Diagnosis present

## 2017-11-13 DIAGNOSIS — I10 Essential (primary) hypertension: Secondary | ICD-10-CM | POA: Diagnosis not present

## 2017-11-13 DIAGNOSIS — T7840XA Allergy, unspecified, initial encounter: Secondary | ICD-10-CM | POA: Insufficient documentation

## 2017-11-13 DIAGNOSIS — Z91040 Latex allergy status: Secondary | ICD-10-CM | POA: Diagnosis not present

## 2017-11-13 MED ORDER — METHYLPREDNISOLONE SODIUM SUCC 125 MG IJ SOLR
125.0000 mg | Freq: Once | INTRAMUSCULAR | Status: AC
  Administered 2017-11-13: 125 mg via INTRAVENOUS
  Filled 2017-11-13: qty 2

## 2017-11-13 MED ORDER — DIPHENHYDRAMINE HCL 50 MG/ML IJ SOLN
50.0000 mg | Freq: Once | INTRAMUSCULAR | Status: AC
  Administered 2017-11-13: 50 mg via INTRAVENOUS
  Filled 2017-11-13: qty 1

## 2017-11-13 MED ORDER — EPINEPHRINE 0.3 MG/0.3ML IJ SOAJ
0.3000 mg | Freq: Once | INTRAMUSCULAR | Status: AC
  Administered 2017-11-13: 0.3 mg via INTRAMUSCULAR
  Filled 2017-11-13: qty 0.3

## 2017-11-13 MED ORDER — FAMOTIDINE IN NACL 20-0.9 MG/50ML-% IV SOLN
20.0000 mg | Freq: Once | INTRAVENOUS | Status: AC
  Administered 2017-11-13: 20 mg via INTRAVENOUS
  Filled 2017-11-13: qty 50

## 2017-11-13 NOTE — ED Provider Notes (Signed)
Emergency Department Provider Note   I have reviewed the triage vital signs and the nursing notes.   HISTORY  Chief Complaint Allergic Reaction   HPI Kimberly [name] is a 27 y.o. 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  The patient has had crab legs in the past with no difficulty.  She has had more mild allergic reactions to other foods.  She has never had an anaphylactic reaction requiring epinephrine.  Patient denies any abdominal pain, nausea, vomiting. No radiation of symptoms or modifying factors.    Past Medical History:  Diagnosis Date  . Hypertension   . Obesity   . Ovarian cyst    left side  . Pregnancy induced hypertension   . Pregnant     Patient Active Problem List   Diagnosis Date Noted  . Parapneumonic effusion 07/26/2014  . HTN (hypertension) 07/26/2014  . Hypokalemia 07/26/2014  . Leukocytosis 07/26/2014  . Healthcare-associated pneumonia   . Maternal morbid obesity, delivered, current hospitalization (HCC) 07/21/2014  . PIH (pregnancy induced hypertension) 07/19/2014  . Maternal morbid obesity, antepartum (HCC)   . Prior pregnancy complicated by IUGR, antepartum   . Hx of preeclampsia, prior pregnancy, currently pregnant     Past Surgical History:  Procedure Laterality Date  . CESAREAN SECTION  04/09/2011   Procedure: CESAREAN SECTION;  Surgeon: Loney Laurence;  Location: [facility];  Service: Gynecology;  Laterality: N/A;  . CESAREAN SECTION N/A 07/21/2014   Procedure: CESAREAN SECTION;  Surgeon: Freddrick March. Tenny Craw, MD;  Location: [facility];  Service: Obstetrics;  Laterality: N/A;  . DILATION AND CURETTAGE OF UTERUS    . KNEE ARTHROSCOPY    . TONSILLECTOMY    . TUBAL LIGATION    . WISDOM TOOTH EXTRACTION      Current Outpatient Rx  . Order #: [id number] Class:  Phone In  . Order #: [id number] Class: Print  . Order #: [id number] Class: Print  . Order #: [id number] Class: Fax  . Order #: [id number] Class: Fax  . Order #: [id number] Class: Print  . Order #: [id number] Class: Print  . [START ON 11/15/2017] Order #: [id number] Class: Print    Allergies Lemon juice; Orange juice; Citrus; Penicillins; Sulfa antibiotics; Amoxicillin; and Latex  Family History  Problem Relation Age of Onset  . Diabetes Mother   . Diabetes Maternal Grandmother   . Diabetes Maternal Grandfather   . Diabetes Paternal Grandmother   . Stroke Paternal Grandmother     Social History Social History   Tobacco Use  . Smoking status: Never Smoker  . Smokeless tobacco: Never Used  Substance Use Topics  . Alcohol use: No  . Drug use: No    Review of Systems  Constitutional: No fever/chills Eyes: No visual changes. ENT: No sore throat. Positive lip and throat swelling with voice change.  Cardiovascular: Denies chest pain. Respiratory: Denies shortness of breath. Gastrointestinal: No abdominal pain.  No nausea, no vomiting.  No diarrhea.  No constipation. Genitourinary: Negative for dysuria. Musculoskeletal: Negative for back pain. Skin: Negative for rash. Neurological: Negative for headaches, focal weakness or numbness.  10-point ROS otherwise negative.  ____________________________________________   PHYSICAL EXAM:  VITAL SIGNS: ED Triage Vitals  Enc Vitals Group     BP 11/13/17 2124 (!) 159/71     Pulse Rate 11/13/17 2124 94     Resp 11/13/17 2124 20  Temp 11/13/17 2124 98.8 F (37.1 C)     Temp Source 11/13/17 2124 Oral     SpO2 11/13/17 2124 100 %     Weight 11/13/17 2124 (!) 340 lb (154.2 kg)     Height 11/13/17 2124 5' 6.5" (1.689 m)     Pain Score 11/13/17 2123 10   Constitutional: Alert and oriented. Well appearing and in no acute distress. Eyes: Conjunctivae are normal. Head: Atraumatic. Nose: No congestion/rhinnorhea. Mouth/Throat: Mucous  membranes are moist.  Oropharynx non-erythematous without tongue swelling. Moderate upper and lower lip edema.  Neck: No stridor.  Cardiovascular: Normal rate, regular rhythm. Good peripheral circulation. Grossly normal heart sounds.   Respiratory: Normal respiratory effort.  No retractions. Lungs CTAB. Gastrointestinal: Soft and nontender. No distention.  Musculoskeletal: No lower extremity tenderness nor edema. No gross deformities of extremities. Neurologic:  Normal speech and language. No gross focal neurologic deficits are appreciated.  Skin:  Skin is warm, dry and intact. No rash noted.  ____________________________________________  RADIOLOGY  None ____________________________________________   PROCEDURES  Procedure(s) performed:   .Critical Care Performed by: Maia Plan, MD Authorized by: Maia Plan, MD   Critical care provider statement:    Critical care time (minutes):  35   Critical care time was exclusive of:  Separately billable procedures and treating other patients and teaching time   Critical care was necessary to treat or prevent imminent or life-threatening deterioration of the following conditions:  Respiratory failure (anaphylaxis)   Critical care was time spent personally by me on the following activities:  Development of treatment plan with patient or surrogate, evaluation of patient's response to treatment, examination of patient, obtaining history from patient or surrogate, ordering and performing treatments and interventions, pulse oximetry, re-evaluation of patient's condition and review of old charts   I assumed direction of critical care for this patient from another provider in my specialty: no      ____________________________________________   INITIAL IMPRESSION / ASSESSMENT AND PLAN / ED COURSE  Pertinent labs & imaging results that were available during my care of the patient were reviewed by me and considered in my medical decision  making (see chart for details).  Patient presents to the emergency department for evaluation of acute allergic reaction which includes lip swelling and tingling sensation in the back of the throat.  The patient speaking in a clear voice but does have noticeable lip swelling which all appears anterior.  Given her throat tingling along with lip swelling I elected to treat the patient with intramuscular epinephrine along with steroid, Pepcid, Benadryl.  The patient will be monitored for 4 hours for rebound reaction and reassessed.   11:38 PM Patient feeling much improved. Plan for obs until 01:45 AM and then discharge. Care transferred to Dr. Nicanor Alcon.  ____________________________________________  FINAL CLINICAL IMPRESSION(S) / ED DIAGNOSES  Final diagnoses:  Allergic reaction, initial encounter     MEDICATIONS GIVEN DURING THIS VISIT:  Medications  EPINEPHrine (EPI-PEN) injection 0.3 mg (0.3 mg Intramuscular Given 11/13/17 2210)  methylPREDNISolone sodium succinate (SOLU-MEDROL) 125 mg/2 mL injection 125 mg (125 mg Intravenous Given 11/13/17 2143)  famotidine (PEPCID) IVPB 20 mg premix (0 mg Intravenous Stopped 11/13/17 2227)  diphenhydrAMINE (BENADRYL) injection 50 mg (50 mg Intravenous Given 11/13/17 2152)     NEW OUTPATIENT MEDICATIONS STARTED DURING THIS VISIT:  New Prescriptions   EPINEPHRINE (EPIPEN 2-PAK) 0.3 MG/0.3 ML IJ SOAJ INJECTION    Inject 0.3 mLs (0.3 mg total) into the muscle once for 1  dose.   PREDNISONE (DELTASONE) 10 MG TABLET    Take 3 tablets (30 mg total) by mouth daily for 4 days.    Note:  This document was prepared using Dragon voice recognition software and may include unintentional dictation errors.  Alona Bene, MD Emergency Medicine    Long, Arlyss Repress, MD 11/14/17 [phone]

## 2017-11-13 NOTE — ED Triage Notes (Signed)
Pt c/o lip swelling and itchy throat following crab leg dinner approximately 30 minutes ago, no meds prior to arrival

## 2017-11-13 NOTE — ED Notes (Signed)
Patient states that she is feeling better; nad noted; will continue to monitor; po fluids given to patient and family member.

## 2017-11-13 NOTE — ED Notes (Signed)
ED Provider at bedside. 

## 2017-11-14 MED ORDER — EPINEPHRINE 0.3 MG/0.3ML IJ SOAJ: 0.3000 mg | Freq: Once | INTRAMUSCULAR | 0 refills | Status: AC

## 2017-11-14 MED ORDER — PREDNISONE 10 MG PO TABS: 30.0000 mg | ORAL_TABLET | Freq: Every day | ORAL | 0 refills | Status: AC

## 2017-11-14 NOTE — Discharge Instructions (Signed)

## 2018-04-02 ENCOUNTER — Other Ambulatory Visit: Payer: Self-pay

## 2018-04-02 ENCOUNTER — Encounter (HOSPITAL_BASED_OUTPATIENT_CLINIC_OR_DEPARTMENT_OTHER): Payer: Self-pay | Admitting: Emergency Medicine

## 2018-04-02 ENCOUNTER — Emergency Department (HOSPITAL_BASED_OUTPATIENT_CLINIC_OR_DEPARTMENT_OTHER)
Admission: EM | Admit: 2018-04-02 | Discharge: 2018-04-02 | Disposition: A | Discharge status: Home / Self Care | Payer: Commercial Managed Care - PPO | Attending: Emergency Medicine | Admitting: Emergency Medicine

## 2018-04-02 DIAGNOSIS — Z91040 Latex allergy status: Secondary | ICD-10-CM | POA: Diagnosis not present

## 2018-04-02 DIAGNOSIS — Z209 Contact with and (suspected) exposure to unspecified communicable disease: Secondary | ICD-10-CM | POA: Insufficient documentation

## 2018-04-02 DIAGNOSIS — J0110 Acute frontal sinusitis, unspecified: Secondary | ICD-10-CM

## 2018-04-02 DIAGNOSIS — G501 Atypical facial pain: Secondary | ICD-10-CM | POA: Diagnosis present

## 2018-04-02 DIAGNOSIS — I10 Essential (primary) hypertension: Secondary | ICD-10-CM | POA: Insufficient documentation

## 2018-04-02 MED ORDER — LORATADINE 10 MG PO TABS: 10.0000 mg | ORAL_TABLET | Freq: Once | ORAL | Status: DC

## 2018-04-02 MED ORDER — LORATADINE 10 MG PO TABS: 10.0000 mg | ORAL_TABLET | Freq: Every day | ORAL | 0 refills | Status: DC

## 2018-04-02 MED ORDER — FLUTICASONE PROPIONATE 50 MCG/ACT NA SUSP: 2.0000 | Freq: Every day | NASAL | 0 refills | Status: DC

## 2018-04-02 MED ORDER — DEXAMETHASONE 6 MG PO TABS
16.0000 mg | ORAL_TABLET | Freq: Once | ORAL | Status: AC
  Administered 2018-04-02: 16 mg via ORAL
  Filled 2018-04-02: qty 1

## 2018-04-02 MED ORDER — KETOROLAC TROMETHAMINE 60 MG/2ML IM SOLN
60.0000 mg | Freq: Once | INTRAMUSCULAR | Status: AC
  Administered 2018-04-02: 60 mg via INTRAMUSCULAR
  Filled 2018-04-02: qty 2

## 2018-04-02 MED ORDER — IBUPROFEN 800 MG PO TABS: 800.0000 mg | ORAL_TABLET | Freq: Three times a day (TID) | ORAL | 0 refills | Status: DC

## 2018-04-02 NOTE — ED Provider Notes (Signed)
Emergency Department Provider Note   I have reviewed the triage vital signs and the nursing notes.   HISTORY  Chief Complaint Facial Pain   HPI Kimberly [name] is a 27 y.o. female without significant past medical history the presents to the emergency department today secondary to facial pain.  Patient states she had a little bit of facial pain yesterday but when she woke up this morning she felt like she was punched right in the forehead.  States that the dull pain.  She has no neurologic symptoms.  She had no fever at home.  She states that her right ear hurts in her left ear hurts in her throat hurts and her nose has been a bit runny as well.  She states that her husband has been sick recently.  She has not had any nausea or vomiting.  No abdominal pain.  No shortness of breath or cough. No other associated or modifying symptoms.    Past Medical History:  Diagnosis Date  . Hypertension   . Obesity   . Ovarian cyst    left side  . Pregnancy induced hypertension   . Pregnant     Patient Active Problem List   Diagnosis Date Noted  . Parapneumonic effusion 07/26/2014  . HTN (hypertension) 07/26/2014  . Hypokalemia 07/26/2014  . Leukocytosis 07/26/2014  . Healthcare-associated pneumonia   . Maternal morbid obesity, delivered, current hospitalization (HCC) 07/21/2014  . PIH (pregnancy induced hypertension) 07/19/2014  . Maternal morbid obesity, antepartum (HCC)   . Prior pregnancy complicated by IUGR, antepartum   . Hx of preeclampsia, prior pregnancy, currently pregnant     Past Surgical History:  Procedure Laterality Date  . CESAREAN SECTION  04/09/2011   Procedure: CESAREAN SECTION;  Surgeon: Loney Laurence;  Location: [facility];  Service: Gynecology;  Laterality: N/A;  . CESAREAN SECTION N/A 07/21/2014   Procedure: CESAREAN SECTION;  Surgeon: Freddrick March. Tenny Craw, MD;  Location: [facility];  Service: Obstetrics;  Laterality: N/A;  . DILATION AND CURETTAGE OF UTERUS    . KNEE  ARTHROSCOPY    . TONSILLECTOMY    . TUBAL LIGATION    . WISDOM TOOTH EXTRACTION      Current Outpatient Rx  . Order #: [id number] Class: Print  . Order #: [id number] Class: Print  . Order #: [id number] Class: Print    Allergies Crab [shellfish allergy]; Lemon juice; Orange juice; Citrus; Penicillins; Sulfa antibiotics; Amoxicillin; and Latex  Family History  Problem Relation Age of Onset  . Diabetes Mother   . Diabetes Maternal Grandmother   . Diabetes Maternal Grandfather   . Diabetes Paternal Grandmother   . Stroke Paternal Grandmother     Social History Social History   Tobacco Use  . Smoking status: Never Smoker  . Smokeless tobacco: Never Used  Substance Use Topics  . Alcohol use: No  . Drug use: No    Review of Systems  All other systems negative except as documented in the HPI. All pertinent positives and negatives as reviewed in the HPI. ____________________________________________   PHYSICAL EXAM:  VITAL SIGNS: ED Triage Vitals  Enc Vitals Group     BP 04/02/18 0819 135/88     Pulse Rate 04/02/18 0819 (!) 116     Resp 04/02/18 0819 18     Temp 04/02/18 0819 98.3 F (36.8 C)     Temp Source 04/02/18 0819 Oral     SpO2 04/02/18 0819 98 %     Weight 04/02/18 0819 (!) 353 lb (  160.1 kg)     Height 04/02/18 0819 5\' 6"  (1.676 m)     Head Circumference --      Peak Flow --      Pain Score 04/02/18 0831 7     Pain Loc --      Pain Edu? --      Excl. in GC? --     Constitutional: Alert and oriented. Well appearing and in no acute distress. EARS: mild left erythema around TM no effusion/bulging/TM erythema, Right EAC and TM normal Eyes: Conjunctivae are normal. PERRL. EOMI. Head: Atraumatic. Nose: mild congestion but no rhinnorhea. Mouth/Throat: Mucous membranes are moist.  Oropharynx non-erythematous. Mild ttp to frontal and bilateral maxillary sinuses Neck: No stridor.  No meningeal signs.   Cardiovascular: Normal rate, regular rhythm. Good peripheral  circulation. Grossly normal heart sounds.   Respiratory: Normal respiratory effort.  No retractions. Lungs CTAB. Gastrointestinal: Soft and nontender. No distention.  Musculoskeletal: No lower extremity tenderness nor edema. No gross deformities of extremities. Neurologic:  Normal speech and language. No gross focal neurologic deficits are appreciated. No altered mental status, able to give full seemingly accurate history.  Face is symmetric, EOM's intact, pupils equal and reactive, vision intact, tongue and uvula midline without deviation. Upper and Lower extremity motor 5/5, intact pain perception in distal extremities, 2+ reflexes in biceps, patella and achilles tendons. Walks without assistance or evident ataxia.  Skin:  Skin is warm, dry and intact. No rash noted.  ____________________________________________     INITIAL IMPRESSION / ASSESSMENT AND PLAN / ED COURSE  I suspect acute on complicated sinusitis.  No evidence of venous thrombosis or other intracranial complications..  Triage note and vitals states that her heart rate was elevated however on my exam I palpated heart rate of 96.  No indication for antibiotics with her symptoms of only been present for 24 hours.  Will treat symptomatically with PCP follow-up or here if not improving in 5 days or sooner if worsening.   Pertinent labs & imaging results that were available during my care of the patient were reviewed by me and considered in my medical decision making (see chart for details).  ____________________________________________  FINAL CLINICAL IMPRESSION(S) / ED DIAGNOSES  Final diagnoses:  Acute non-recurrent frontal sinusitis     MEDICATIONS GIVEN DURING THIS VISIT:  Medications  ketorolac (TORADOL) injection 60 mg (has no administration in time range)  dexamethasone (DECADRON) tablet 16 mg (has no administration in time range)  loratadine (CLARITIN) tablet 10 mg (has no administration in time range)     NEW  OUTPATIENT MEDICATIONS STARTED DURING THIS VISIT:  New Prescriptions   FLUTICASONE (FLONASE) 50 MCG/ACT NASAL SPRAY    Place 2 sprays into both nostrils daily for 5 days.   IBUPROFEN (ADVIL,MOTRIN) 800 MG TABLET    Take 1 tablet (800 mg total) by mouth 3 (three) times daily.   LORATADINE (CLARITIN) 10 MG TABLET    Take 1 tablet (10 mg total) by mouth daily. One po daily x 5 days    Note:  This note was prepared with assistance of Dragon voice recognition software. Occasional wrong-word or sound-a-like substitutions may have occurred due to the inherent limitations of voice recognition software.   Marily Memos, MD 04/02/18 [phone]

## 2018-04-02 NOTE — ED Triage Notes (Addendum)
Facial pain since yesterday.  Pt state pressure is around eyes, nose, and face.  No known fever, some chills.  Pt also having ear pain and sore throat.

## 2018-06-12 ENCOUNTER — Emergency Department (HOSPITAL_COMMUNITY): Payer: Commercial Managed Care - PPO

## 2018-06-12 ENCOUNTER — Encounter (HOSPITAL_COMMUNITY): Payer: Self-pay | Admitting: Emergency Medicine

## 2018-06-12 ENCOUNTER — Emergency Department (HOSPITAL_COMMUNITY)
Admission: EM | Admit: 2018-06-12 | Discharge: 2018-06-12 | Disposition: A | Discharge status: Home / Self Care | Payer: Commercial Managed Care - PPO | Attending: Emergency Medicine | Admitting: Emergency Medicine

## 2018-06-12 DIAGNOSIS — Z79899 Other long term (current) drug therapy: Secondary | ICD-10-CM | POA: Diagnosis not present

## 2018-06-12 DIAGNOSIS — J111 Influenza due to unidentified influenza virus with other respiratory manifestations: Secondary | ICD-10-CM | POA: Diagnosis not present

## 2018-06-12 DIAGNOSIS — R079 Chest pain, unspecified: Secondary | ICD-10-CM | POA: Diagnosis not present

## 2018-06-12 DIAGNOSIS — Z91040 Latex allergy status: Secondary | ICD-10-CM | POA: Diagnosis not present

## 2018-06-12 DIAGNOSIS — R05 Cough: Secondary | ICD-10-CM | POA: Diagnosis present

## 2018-06-12 DIAGNOSIS — R69 Illness, unspecified: Secondary | ICD-10-CM

## 2018-06-12 DIAGNOSIS — I10 Essential (primary) hypertension: Secondary | ICD-10-CM | POA: Insufficient documentation

## 2018-06-12 LAB — CBC
HCT: 30.3 % — ABNORMAL LOW (ref 36.0–46.0)
Hemoglobin: 8.9 g/dL — ABNORMAL LOW (ref 12.0–15.0)
MCH: 19.6 pg — AB (ref 26.0–34.0)
MCHC: 29.4 g/dL — ABNORMAL LOW (ref 30.0–36.0)
MCV: 66.9 fL — ABNORMAL LOW (ref 80.0–100.0)
Platelets: 319 10*3/uL (ref 150–400)
RBC: 4.53 MIL/uL (ref 3.87–5.11)
RDW: 18.1 % — ABNORMAL HIGH (ref 11.5–15.5)
WBC: 6 10*3/uL (ref 4.0–10.5)
nRBC: 0 % (ref 0.0–0.2)

## 2018-06-12 LAB — I-STAT BETA HCG BLOOD, ED (NOT ORDERABLE)

## 2018-06-12 LAB — BASIC METABOLIC PANEL
Anion gap: 10 (ref 5–15)
BUN: 10 mg/dL (ref 6–20)
CO2: 25 mmol/L (ref 22–32)
Calcium: 8.9 mg/dL (ref 8.9–10.3)
Chloride: 103 mmol/L (ref 98–111)
Creatinine, Ser: 0.86 mg/dL (ref 0.44–1.00)
GFR calc Af Amer: >60 mL/min (ref >60)
GFR calc non Af Amer: >60 mL/min (ref >60)
Glucose, Bld: 103 mg/dL — ABNORMAL HIGH (ref 70–99)
POTASSIUM: 3.4 mmol/L — AB (ref 3.5–5.1)
Sodium: 138 mmol/L (ref 135–145)

## 2018-06-12 LAB — POCT I-STAT TROPONIN I: Troponin i, poc: 0 ng/mL (ref 0.00–0.08)

## 2018-06-12 MED ORDER — KETOROLAC TROMETHAMINE 30 MG/ML IJ SOLN
30.0000 mg | Freq: Once | INTRAMUSCULAR | Status: AC
  Administered 2018-06-12: 30 mg via INTRAVENOUS
  Filled 2018-06-12: qty 1

## 2018-06-12 MED ORDER — OSELTAMIVIR PHOSPHATE 75 MG PO CAPS: 75.0000 mg | ORAL_CAPSULE | Freq: Two times a day (BID) | ORAL | 0 refills | Status: DC

## 2018-06-12 MED ORDER — SODIUM CHLORIDE 0.9 % IV BOLUS
1000.0000 mL | Freq: Once | INTRAVENOUS | Status: AC
  Administered 2018-06-12: 1000 mL via INTRAVENOUS

## 2018-06-12 NOTE — ED Triage Notes (Signed)
Pt c/o headache, cough and generalized chest pains that started this morning.

## 2018-06-12 NOTE — ED Notes (Signed)
PT DISCHARGED. INSTRUCTIONS GIVEN. AAOX4. PT IN NO APPARENT DISTRESS WITH MILD PAIN. THE OPPORTUNITY TO ASK QUESTIONS WAS PROVIDED. 

## 2018-06-12 NOTE — Discharge Instructions (Addendum)
You were seen in the emergency department for evaluation of symptoms that are likely related to flu.  Your heart test did not show any signs of heart damage and your chest x-ray did not show pneumonia.  You should continue to still well hydrated and take Tylenol and ibuprofen for pain.  We are prescribing you Tamiflu as this may shorten the course of your symptoms.  Please return if any worsening symptoms.

## 2018-06-12 NOTE — ED Provider Notes (Signed)
[facility]-EMERGENCY DEPT Provider Note   CSN: [id number] Arrival date & time: 06/12/18  1638     History   Chief Complaint Chief Complaint  Patient presents with  . Chest Pain  . Headache  . Cough    HPI Kimberly [name] is a 27 y.o. female.  She is complaining of an illness that started last evening.  She is complaining of cough minimally productive of some sputum.  Pain in her chest worse with cough.  Headache body aches nausea and fatigue.  She did not get a flu shot this year.  She is had a low-grade temp of 100.4.  No sick contacts or recent travel.  No vomiting or diarrhea no urinary symptoms no vaginal bleeding or discharge.  The history is provided by the patient.  Chest Pain   This is a new problem. The current episode started yesterday. The problem has not changed since onset.The pain is associated with coughing. The pain is present in the substernal region. The pain is moderate. The quality of the pain is described as stabbing. The pain does not radiate. Associated symptoms include cough, diaphoresis, a fever, headaches, nausea and sputum production. Pertinent negatives include no abdominal pain, no shortness of breath and no vomiting. Treatments tried: ibuprofen. The treatment provided moderate relief.  Her past medical history is significant for hypertension.    Past Medical History:  Diagnosis Date  . Hypertension   . Obesity   . Ovarian cyst    left side  . Pregnancy induced hypertension   . Pregnant     Patient Active Problem List   Diagnosis Date Noted  . Parapneumonic effusion 07/26/2014  . HTN (hypertension) 07/26/2014  . Hypokalemia 07/26/2014  . Leukocytosis 07/26/2014  . Healthcare-associated pneumonia   . Maternal morbid obesity, delivered, current hospitalization (HCC) 07/21/2014  . PIH (pregnancy induced hypertension) 07/19/2014  . Maternal morbid obesity, antepartum (HCC)   . Prior pregnancy complicated by IUGR,  antepartum   . Hx of preeclampsia, prior pregnancy, currently pregnant     Past Surgical History:  Procedure Laterality Date  . CESAREAN SECTION  04/09/2011   Procedure: CESAREAN SECTION;  Surgeon: Loney LaurenceMichelle A Horvath;  Location: [facility];  Service: Gynecology;  Laterality: N/A;  . CESAREAN SECTION N/A 07/21/2014   Procedure: CESAREAN SECTION;  Surgeon: Freddrick MarchKendra H. Tenny Crawoss, MD;  Location: [facility];  Service: Obstetrics;  Laterality: N/A;  . DILATION AND CURETTAGE OF UTERUS    . KNEE ARTHROSCOPY    . TONSILLECTOMY    . TUBAL LIGATION    . WISDOM TOOTH EXTRACTION       OB History    Gravida  5   Para  2   Term  1   Preterm  1   AB  3   Living  2     SAB  1   TAB  1   Ectopic  1   Multiple  0   Live Births  2            Home Medications    Prior to Admission medications   Medication Sig Start Date End Date Taking? Authorizing Provider  fluticasone (FLONASE) 50 MCG/ACT nasal spray Place 2 sprays into both nostrils daily for 5 days. 04/02/18 04/07/18  Mesner, Barbara CowerJason, MD  ibuprofen (ADVIL,MOTRIN) 800 MG tablet Take 1 tablet (800 mg total) by mouth 3 (three) times daily. 04/02/18   Mesner, Barbara CowerJason, MD  loratadine (CLARITIN) 10 MG tablet Take 1 tablet (10 mg  total) by mouth daily. One po daily x 5 days 04/02/18   Mesner, Barbara Cower, MD    Family History Family History  Problem Relation Age of Onset  . Diabetes Mother   . Diabetes Maternal Grandmother   . Diabetes Maternal Grandfather   . Diabetes Paternal Grandmother   . Stroke Paternal Grandmother     Social History Social History   Tobacco Use  . Smoking status: Never Smoker  . Smokeless tobacco: Never Used  Substance Use Topics  . Alcohol use: No  . Drug use: No     Allergies   Crab [shellfish allergy]; Lemon juice; Orange juice; Citrus; Penicillins; Sulfa antibiotics; Amoxicillin; and Latex   Review of Systems Review of Systems  Constitutional: Positive for chills, diaphoresis, fatigue and fever.  HENT:  Negative for sore throat.   Eyes: Negative for visual disturbance.  Respiratory: Positive for cough and sputum production. Negative for shortness of breath.   Cardiovascular: Positive for chest pain.  Gastrointestinal: Positive for nausea. Negative for abdominal pain and vomiting.  Genitourinary: Negative for dysuria.  Musculoskeletal: Positive for arthralgias and myalgias.  Skin: Negative for rash.  Neurological: Positive for headaches.     Physical Exam Updated Vital Signs BP 125/65 (BP Location: Left Arm)   Pulse 99   Temp 98.9 F (37.2 C) (Oral)   Resp (!) 24   LMP 06/10/2018   SpO2 98%   Physical Exam Vitals signs and nursing note reviewed.  Constitutional:      General: She is not in acute distress.    Appearance: She is well-developed.  HENT:     Head: Normocephalic and atraumatic.  Eyes:     Conjunctiva/sclera: Conjunctivae normal.  Neck:     Musculoskeletal: Neck supple.  Cardiovascular:     Rate and Rhythm: Regular rhythm. Tachycardia present.     Heart sounds: No murmur.  Pulmonary:     Effort: Pulmonary effort is normal. No respiratory distress.     Breath sounds: Normal breath sounds.  Abdominal:     Palpations: Abdomen is soft.     Tenderness: There is no abdominal tenderness.  Musculoskeletal: Normal range of motion.     Right lower leg: She exhibits no tenderness.     Left lower leg: She exhibits no tenderness.  Skin:    General: Skin is warm and dry.     Capillary Refill: Capillary refill takes less than 2 seconds.  Neurological:     General: No focal deficit present.     Mental Status: She is alert and oriented to person, place, and time.      ED Treatments / Results  Labs (all labs ordered are listed, but only abnormal results are displayed) Labs Reviewed  BASIC METABOLIC PANEL - Abnormal; Notable for the following components:      Result Value   Potassium 3.4 (*)    Glucose, Bld 103 (*)    All other components within normal limits    CBC - Abnormal; Notable for the following components:   Hemoglobin 8.9 (*)    HCT 30.3 (*)    MCV 66.9 (*)    MCH 19.6 (*)    MCHC 29.4 (*)    RDW 18.1 (*)    All other components within normal limits  INFLUENZA PANEL BY PCR (TYPE A & B)  I-STAT TROPONIN, ED  I-STAT BETA HCG BLOOD, ED ([facility], [facility], [facility] ONLY)  I-STAT BETA HCG BLOOD, ED (NOT ORDERABLE)  POCT I-STAT TROPONIN I    EKG  EKG Interpretation  Date/Time:  Friday June 12 2018 16:44:38 EST Ventricular Rate:  113 PR Interval:    QRS Duration: 84 QT Interval:  323 QTC Calculation: 443 R Axis:   56 Text Interpretation:  Sinus tachycardia similar to prior 4/18 Confirmed by Meridee ScoreButler, Mekhi Sonn [phone]) on 06/12/2018 6:15:18 PM   Radiology Dg Chest 2 View  Result Date: 06/12/2018 CLINICAL DATA:  Pt reported center chest pain and SOB that started this morning. Pt also reported fever and cough since last night. EXAM: CHEST - 2 VIEW COMPARISON:  08/06/2016 FINDINGS: The heart size and mediastinal contours are within normal limits. Both lungs are clear. The visualized skeletal structures are unremarkable. IMPRESSION: No active cardiopulmonary disease. Electronically Signed   By: Norva PavlovElizabeth  Brown M.D.   On: 06/12/2018 17:15    Procedures Procedures (including critical care time)  Medications Ordered in ED Medications  sodium chloride 0.9 % bolus 1,000 mL (has no administration in time range)  ketorolac (TORADOL) 30 MG/ML injection 30 mg (has no administration in time range)     Initial Impression / Assessment and Plan / ED Course  I have reviewed the triage vital signs and the nursing notes.  Pertinent labs & imaging results that were available during my care of the patient were reviewed by me and considered in my medical decision making (see chart for details).  Clinical Course as of Jun 12 2341  Fri Jun 12, 2018  2016 Patient feeling a little bit better after some Toradol and fluids.  She had refused to have the flu swab  done.  She feels well enough to go home.  She would like to start on the Tamiflu and is asking for a note for work tomorrow for work just in case.   [MB]    Clinical Course User Index [MB] Terrilee FilesButler, Miu Chiong C, MD      Final Clinical Impressions(s) / ED Diagnoses   Final diagnoses:  Influenza-like illness    ED Discharge Orders         Ordered    oseltamivir (TAMIFLU) 75 MG capsule  Every 12 hours     06/12/18 2017           Terrilee FilesButler, Maryori Weide C, MD 06/12/18 [phone]

## 2018-06-12 NOTE — ED Notes (Signed)
PT REFUSED THE FLU SWAB.

## 2018-06-15 ENCOUNTER — Other Ambulatory Visit: Payer: Self-pay

## 2018-06-15 ENCOUNTER — Encounter (HOSPITAL_BASED_OUTPATIENT_CLINIC_OR_DEPARTMENT_OTHER): Payer: Self-pay | Admitting: *Deleted

## 2018-06-15 ENCOUNTER — Emergency Department (HOSPITAL_BASED_OUTPATIENT_CLINIC_OR_DEPARTMENT_OTHER)
Admission: EM | Admit: 2018-06-15 | Discharge: 2018-06-15 | Disposition: A | Discharge status: Home / Self Care | Payer: Commercial Managed Care - PPO | Attending: Emergency Medicine | Admitting: Emergency Medicine

## 2018-06-15 DIAGNOSIS — Z5321 Procedure and treatment not carried out due to patient leaving prior to being seen by health care provider: Secondary | ICD-10-CM | POA: Insufficient documentation

## 2018-06-15 DIAGNOSIS — R05 Cough: Secondary | ICD-10-CM | POA: Diagnosis present

## 2018-06-15 NOTE — ED Triage Notes (Signed)
Cough and sharp chest pain x 3 days. She was seen and told she has the flu. She is no better.

## 2018-12-23 ENCOUNTER — Inpatient Hospital Stay (HOSPITAL_COMMUNITY)
Admission: AD | Admit: 2018-12-23 | Discharge: 2018-12-23 | Disposition: A | Discharge status: Home / Self Care | Payer: Commercial Managed Care - PPO | Attending: Obstetrics and Gynecology | Admitting: Obstetrics and Gynecology

## 2018-12-23 DIAGNOSIS — R102 Pelvic and perineal pain: Secondary | ICD-10-CM | POA: Diagnosis not present

## 2018-12-23 DIAGNOSIS — Z3202 Encounter for pregnancy test, result negative: Secondary | ICD-10-CM | POA: Insufficient documentation

## 2018-12-23 DIAGNOSIS — N926 Irregular menstruation, unspecified: Secondary | ICD-10-CM | POA: Diagnosis not present

## 2018-12-23 LAB — POCT PREGNANCY, URINE: Preg Test, Ur: NEGATIVE

## 2018-12-23 LAB — URINALYSIS, ROUTINE W REFLEX MICROSCOPIC
Bilirubin Urine: NEGATIVE
Glucose, UA: NEGATIVE mg/dL
Hgb urine dipstick: NEGATIVE
Ketones, ur: NEGATIVE mg/dL
Leukocytes,Ua: NEGATIVE
Nitrite: NEGATIVE
Protein, ur: NEGATIVE mg/dL
Specific Gravity, Urine: 1.019 (ref 1.005–1.030)
pH: 6 (ref 5.0–8.0)

## 2018-12-23 NOTE — MAU Note (Addendum)
Pt presents to MAU c/o no period since June 5th 2020. Pt states she has her tubes tied and had a neg pregnancy test at home. Pt states she is having abdominal cramping on both sides and tender stomach. Pt states no bleeding. Pt has hx of ectopic pregnancy before her tubal. Pt has fatigue as well. Pt states she is taking iron.   Pregnancy test neg in MAU.    Pt spoke to provider pt left without signing.

## 2018-12-23 NOTE — MAU Provider Note (Signed)
Patient Kimberly [name] is a 28 y.o. [phone] Patient here with complaints of cramping on both sides of her stomach that started 3 days ago. She had her tubes tied on 07-21-2014; she has heavy periods "like hell" since she got them tied. She says that she has regular periods. She feels like she feels hot, sleepy. She has been having diarrhea for a week; "everytime I gotta go to the bathroom I gotta go".  She started iron pills on June 5.  She had an ectopic pregnancy in 2011; she had  methotrexate and a d and c  (per patient).   She has recently recovered from COVID-19 and pneumonia in early June.   Assessment and Plan:  Reviewed that she has a negative pregnancy test here and that, given that  she had two negative pregnancy tests at home, she can be reassured she does not have an ectopic pregnancy.   There could be many different reasons for her pain and irregular periods, such as PCOS, fibroids, endometriosis. She should also try reducing her iron pills to every other day; these could also be a cause of her abdominal pain.   -Advised her to follow up with her ob-gyn or PCP; she plans to call her ob in the morning.   Maye Hides

## 2019-03-16 ENCOUNTER — Other Ambulatory Visit: Payer: Self-pay | Admitting: Obstetrics and Gynecology

## 2019-03-18 ENCOUNTER — Emergency Department (HOSPITAL_COMMUNITY)
Admission: EM | Admit: 2019-03-18 | Discharge: 2019-03-18 | Disposition: A | Discharge status: Home / Self Care | Payer: Commercial Managed Care - PPO

## 2019-03-18 ENCOUNTER — Other Ambulatory Visit: Payer: Self-pay

## 2019-03-18 ENCOUNTER — Inpatient Hospital Stay (HOSPITAL_COMMUNITY)
Admission: AD | Admit: 2019-03-18 | Discharge: 2019-03-18 | Disposition: A | Discharge status: Home / Self Care | Payer: Commercial Managed Care - PPO | Attending: Emergency Medicine | Admitting: Emergency Medicine

## 2019-03-18 DIAGNOSIS — N938 Other specified abnormal uterine and vaginal bleeding: Secondary | ICD-10-CM | POA: Insufficient documentation

## 2019-03-18 DIAGNOSIS — Z5321 Procedure and treatment not carried out due to patient leaving prior to being seen by health care provider: Secondary | ICD-10-CM | POA: Diagnosis not present

## 2019-03-18 NOTE — ED Notes (Signed)
No answer for room x2 

## 2019-03-18 NOTE — MAU Provider Note (Cosign Needed)
  S [name] is a 28 y.o. [phone] non-pregnant female who presents to MAU today with complaint of pain in her legs and heavy vaginal bleeding since July. She is scheduled for D&C/Hysteroscopy with Novasure Ablation with Dr. Philis Pique on 03/31/19. She endorses muscle pain and fatigue which make it difficult to ambulate, worsening overnight. She denies dizziness, SOB, chest pain, weakness and syncope. She verbalizes to CNM that she doesn't think she can wait two more weeks for her surgery.   O BP (!) 151/74 (BP Location: Right Arm)   Pulse 82   Temp 98 F (36.7 C) (Oral)   Resp 20   Ht 5\' 8"  (1.727 m)   Wt (!) 182.9 kg   SpO2 100%   BMI 61.31 kg/m    Physical Exam  Nursing note and vitals reviewed. Constitutional: She is oriented to person, place, and time. She appears well-developed and well-nourished.  Cardiovascular: Normal rate.  Respiratory: No respiratory distress.  GI: Soft.  Neurological: She is alert and oriented to person, place, and time.  Skin: Skin is warm and dry.  Psychiatric: She has a normal mood and affect. Her behavior is normal. Judgment and thought content normal.   A Non pregnant female Medical screening exam complete  P Patient escorted to [facility] for triage and evaluation Dr. Ouida Sills on call for practice, notified at 9:52am of patient arrival and possible impending consult   Mallie Snooks, [place] 03/18/2019 10:07 AM

## 2019-03-18 NOTE — ED Triage Notes (Signed)
Pt here with heavy vaginal bleeding for the last 3 months. States has hysterectomy scheduled Oct 14th. VSS.

## 2019-03-18 NOTE — MAU Note (Signed)
Pt is bleeding, is scheduled for GYN procedure in 2 wks.  Doesn't know if she will be able to wait 2 wks.  Bleeding through pads and clothes. Hurting- not cramps, has chills.  Hurting so bad, is hard to walk, doesn't know if she is going to make it. Bleeding been heavy for a long time.  Changing every 20-30 min. Is definitely NOT pregnant.

## 2019-03-22 NOTE — Progress Notes (Signed)
When checking pt chart to make covid test appt, noted pt BMI 61.28 on 03-18-2019 in epic.  This is over anesthesia guidelines of BMI 50 for ambulatory surgery center.  Called and lvm for stacie wall, or scheduler for dr Philis Pique, about the BMI and pt will need to be done in a Main OR.

## 2019-03-24 ENCOUNTER — Encounter (HOSPITAL_COMMUNITY): Payer: Self-pay

## 2019-03-24 ENCOUNTER — Other Ambulatory Visit: Payer: Self-pay | Admitting: Obstetrics and Gynecology

## 2019-03-24 NOTE — Patient Instructions (Signed)
DUE TO COVID-19 ONLY ONE VISITOR IS ALLOWED TO COME WITH YOU AND STAY IN THE WAITING ROOM ONLY DURING PRE OP AND PROCEDURE DAY OF SURGERY. THE 1 VISITOR MAY VISIT WITH YOU AFTER SURGERY IN YOUR PRIVATE ROOM DURING VISITING HOURS ONLY!  YOU NEED TO HAVE A COVID 19 TEST ON_______ @_______ , THIS TEST MUST BE DONE BEFORE SURGERY, COME  [address], [place] , [phone].  [facility] ONCE YOUR COVID TEST IS COMPLETED, PLEASE BEGIN THE QUARANTINE INSTRUCTIONS AS OUTLINED IN YOUR HANDOUT.                Rogers BlockerKendra N Keeven  03/24/2019   Your procedure is scheduled on: 03-31-19   [facility] Main  [facility]   Report to admitting at      1030  AM     Call this number if you have problems the morning of surgery [phone]    Remember: Do not eat food or drink liquids :After Midnight.  BRUSH YOUR TEETH MORNING OF SURGERY AND RINSE YOUR MOUTH OUT, NO CHEWING GUM CANDY OR MINTS.     Take these medicines the morning of surgery with A SIP OF WATER: BC pill                                  You may not have any metal on your body including hair pins and              piercings  Do not wear jewelry, make-up, lotions, powders or perfumes, deodorant             Do not wear nail polish on your fingernails.  Do not shave  48 hours prior to surgery.       Do not bring valuables to the hospital. [place] [facility] IS NOT             RESPONSIBLE   FOR VALUABLES.  Contacts, dentures or bridgework may not be worn into surgery.      Patients discharged the day of surgery will not be allowed to drive home. IF YOU ARE HAVING SURGERY AND GOING HOME THE SAME DAY, YOU MUST HAVE AN ADULT TO DRIVE YOU HOME AND BE WITH YOU FOR 24 HOURS. YOU MAY GO HOME BY TAXI OR UBER OR ORTHERWISE, BUT AN ADULT MUST ACCOMPANY YOU HOME AND STAY WITH YOU FOR 24 HOURS.  Name and phone number of your driver:  Special Instructions: N/A              Please read over the following fact sheets you were  given: _____________________________________________________________________             [facility] - Preparing for Surgery Before surgery, you can play an important role.  Because skin is not sterile, your skin needs to be as free of germs as possible.  You can reduce the number of germs on your skin by washing with CHG (chlorahexidine gluconate) soap before surgery.  CHG is an antiseptic cleaner which kills germs and bonds with the skin to continue killing germs even after washing. Please DO NOT use if you have an allergy to CHG or antibacterial soaps.  If your skin becomes reddened/irritated stop using the CHG and inform your nurse when you arrive at Short Stay. Do not shave (including legs and underarms) for at least 48 hours prior to the first CHG shower.  You may  shave your face/neck. Please follow these instructions carefully:  1.  Shower with CHG Soap the night before surgery and the  morning of Surgery.  2.  If you choose to wash your hair, wash your hair first as usual with your  normal  shampoo.  3.  After you shampoo, rinse your hair and body thoroughly to remove the  shampoo.                           4.  Use CHG as you would any other liquid soap.  You can apply chg directly  to the skin and wash                       Gently with a scrungie or clean washcloth.  5.  Apply the CHG Soap to your body ONLY FROM THE NECK DOWN.   Do not use on face/ open                           Wound or open sores. Avoid contact with eyes, ears mouth and genitals (private parts).                       Wash face,  Genitals (private parts) with your normal soap.             6.  Wash thoroughly, paying special attention to the area where your surgery  will be performed.  7.  Thoroughly rinse your body with warm water from the neck down.  8.  DO NOT shower/wash with your normal soap after using and rinsing off  the CHG Soap.                9.  Pat yourself dry with a clean towel.            10.  Wear  clean pajamas.            11.  Place clean sheets on your bed the night of your first shower and do not  sleep with pets. Day of Surgery : Do not apply any lotions/deodorants the morning of surgery.  Please wear clean clothes to the hospital/surgery center.  FAILURE TO FOLLOW THESE INSTRUCTIONS MAY RESULT IN THE CANCELLATION OF YOUR SURGERY PATIENT SIGNATURE_________________________________  NURSE SIGNATURE__________________________________  ________________________________________________________________________   Rogelia Mire  An incentive spirometer is a tool that can help keep your lungs clear and active. This tool measures how well you are filling your lungs with each breath. Taking long deep breaths may help reverse or decrease the chance of developing breathing (pulmonary) problems (especially infection) following:  A long period of time when you are unable to move or be active. BEFORE THE PROCEDURE   If the spirometer includes an indicator to show your best effort, your nurse or respiratory therapist will set it to a desired goal.  If possible, sit up straight or lean slightly forward. Try not to slouch.  Hold the incentive spirometer in an upright position. INSTRUCTIONS FOR USE  1. Sit on the edge of your bed if possible, or sit up as far as you can in bed or on a chair. 2. Hold the incentive spirometer in an upright position. 3. Breathe out normally. 4. Place the mouthpiece in your mouth and seal your lips tightly around it. 5. Breathe in slowly and as deeply as possible, raising the piston  or the ball toward the top of the column. 6. Hold your breath for 3-5 seconds or for as long as possible. Allow the piston or ball to fall to the bottom of the column. 7. Remove the mouthpiece from your mouth and breathe out normally. 8. Rest for a few seconds and repeat Steps 1 through 7 at least 10 times every 1-2 hours when you are awake. Take your time and take a few normal  breaths between deep breaths. 9. The spirometer may include an indicator to show your best effort. Use the indicator as a goal to work toward during each repetition. 10. After each set of 10 deep breaths, practice coughing to be sure your lungs are clear. If you have an incision (the cut made at the time of surgery), support your incision when coughing by placing a pillow or rolled up towels firmly against it. Once you are able to get out of bed, walk around indoors and cough well. You may stop using the incentive spirometer when instructed by your caregiver.  RISKS AND COMPLICATIONS  Take your time so you do not get dizzy or light-headed.  If you are in pain, you may need to take or ask for pain medication before doing incentive spirometry. It is harder to take a deep breath if you are having pain. AFTER USE  Rest and breathe slowly and easily.  It can be helpful to keep track of a log of your progress. Your caregiver can provide you with a simple table to help with this. If you are using the spirometer at home, follow these instructions: [place] IF:   You are having difficultly using the spirometer.  You have trouble using the spirometer as often as instructed.  Your pain medication is not giving enough relief while using the spirometer.  You develop fever of 100.5 F (38.1 C) or higher. SEEK IMMEDIATE MEDICAL CARE IF:   You cough up bloody sputum that had not been present before.  You develop fever of 102 F (38.9 C) or greater.  You develop worsening pain at or near the incision site. MAKE SURE YOU:   Understand these instructions.  Will watch your condition.  Will get help right away if you are not doing well or get worse. Document Released: 10/14/2006 Document Revised: 08/26/2011 Document Reviewed: 12/15/2006 [facility] Patient Information 2014 [place], [place].   ________________________________________________________________________

## 2019-03-25 ENCOUNTER — Encounter (HOSPITAL_COMMUNITY): Payer: Self-pay

## 2019-03-25 ENCOUNTER — Other Ambulatory Visit: Payer: Self-pay

## 2019-03-25 ENCOUNTER — Encounter (HOSPITAL_COMMUNITY)
Admission: RE | Admit: 2019-03-25 | Discharge: 2019-03-25 | Disposition: A | Discharge status: Home / Self Care | Payer: Commercial Managed Care - PPO | Source: Ambulatory Visit | Attending: Obstetrics and Gynecology | Admitting: Obstetrics and Gynecology

## 2019-03-25 DIAGNOSIS — Z01812 Encounter for preprocedural laboratory examination: Secondary | ICD-10-CM | POA: Diagnosis not present

## 2019-03-25 DIAGNOSIS — N920 Excessive and frequent menstruation with regular cycle: Secondary | ICD-10-CM | POA: Insufficient documentation

## 2019-03-25 HISTORY — DX: Anemia, unspecified: D64.9

## 2019-03-25 HISTORY — DX: Pneumonia, unspecified organism: J18.9

## 2019-03-25 LAB — BASIC METABOLIC PANEL
Anion gap: 9 (ref 5–15)
BUN: 10 mg/dL (ref 6–20)
CO2: 26 mmol/L (ref 22–32)
Calcium: 8.7 mg/dL — ABNORMAL LOW (ref 8.9–10.3)
Chloride: 103 mmol/L (ref 98–111)
Creatinine, Ser: 0.7 mg/dL (ref 0.44–1.00)
GFR calc Af Amer: >60 mL/min (ref >60)
GFR calc non Af Amer: >60 mL/min (ref >60)
Glucose, Bld: 104 mg/dL — ABNORMAL HIGH (ref 70–99)
Potassium: 3.8 mmol/L (ref 3.5–5.1)
Sodium: 138 mmol/L (ref 135–145)

## 2019-03-25 LAB — CBC
HCT: 32 % — ABNORMAL LOW (ref 36.0–46.0)
Hemoglobin: 9.2 g/dL — ABNORMAL LOW (ref 12.0–15.0)
MCH: 20.1 pg — ABNORMAL LOW (ref 26.0–34.0)
MCHC: 28.8 g/dL — ABNORMAL LOW (ref 30.0–36.0)
MCV: 69.9 fL — ABNORMAL LOW (ref 80.0–100.0)
Platelets: 356 10*3/uL (ref 150–400)
RBC: 4.58 MIL/uL (ref 3.87–5.11)
RDW: 17.8 % — ABNORMAL HIGH (ref 11.5–15.5)
WBC: 7 10*3/uL (ref 4.0–10.5)
nRBC: 0 % (ref 0.0–0.2)

## 2019-03-25 NOTE — Progress Notes (Signed)
PCP - none per pt. Cardiologist -   Chest x-ray - 06-12-18 epic EKG - 06-12-18 epic Stress Test -  ECHO -  Cardiac Cath -   Sleep Study -  CPAP -   Fasting Blood Sugar -  Checks Blood Sugar _____ times a day  Blood Thinner Instructions: Aspirin Instructions: Last Dose:  Anesthesia review: BMI 60.97, pt. states she can climb a flight of stairs, vacuum without shortness of breathe . She also has 2 small children 48 and 30 years old she is active with , hgb 9.2 (improved)  Patient denies shortness of breath, fever, cough and chest pain at PAT appointment   NONE   Patient verbalized understanding of instructions that were given to them at the PAT appointment. Patient was also instructed that they will need to review over the PAT instructions again at home before surgery.

## 2019-03-27 ENCOUNTER — Other Ambulatory Visit (HOSPITAL_COMMUNITY)
Admission: RE | Admit: 2019-03-27 | Discharge: 2019-03-27 | Disposition: A | Discharge status: Home / Self Care | Payer: Commercial Managed Care - PPO | Source: Ambulatory Visit | Attending: Obstetrics and Gynecology | Admitting: Obstetrics and Gynecology

## 2019-03-27 DIAGNOSIS — Z01812 Encounter for preprocedural laboratory examination: Secondary | ICD-10-CM | POA: Insufficient documentation

## 2019-03-27 DIAGNOSIS — Z20828 Contact with and (suspected) exposure to other viral communicable diseases: Secondary | ICD-10-CM | POA: Insufficient documentation

## 2019-03-28 LAB — NOVEL CORONAVIRUS, NAA (HOSP ORDER, SEND-OUT TO REF LAB; TAT 18-24 HRS): SARS-CoV-2, NAA: NOT DETECTED

## 2019-03-30 NOTE — H&P (Signed)
28 y.o. [id number] complains of sever menometrarhaggia.  Previously:"Pt. here for pre op visit for [facility] Hysteroscopy/Novasure, scheduled on 03/31/2019 at [facility]; Pt. still bleeding and taking combo OCPs/tb/  BP today 130/84. S/p BTL.   Previously:"Annual Exam For premenopausal, patient reports birth control method birth control pills and menstrual cycles are normal. [place] this month 11x7x5: EM 5.87mm; RO not seen, LO normal and no FF. H/H was 9.1/30.7. TSH was 4.2 but T4 was normal.  Did 2/2/2 OCPs, then to one day. No migraines no neuro sx. Pt. here for AEX; Pt. states she is still bleeding, has been bleeding since July, started POPs on 09/10, didn't help, then started short term combo OCP on 09/15, 2/2/2 then daily for menorrhagia, bleeding is lighter in flow; BP running <142/75 at home; having blurred vision since starting combo pill; s/p BTL; had FBW on 02/26/2019; had [place] on 02/25/2019; declines GC/CT; pap with HPV testing/tb"  With such severe menorrhagia and she is unable to stay on OCPs secondary HTN, will proceed with hysteroscopy, D&C, novasure."   Past Medical History:  Diagnosis Date  . Anemia   . Hypertension   . Obesity   . Ovarian cyst    left side  . Pneumonia   . Pregnancy induced hypertension   . Pregnant    Past Surgical History:  Procedure Laterality Date  . CESAREAN SECTION  04/09/2011   Procedure: CESAREAN SECTION;  Surgeon: Daria Pastures;  Location: [facility];  Service: Gynecology;  Laterality: N/A;  . CESAREAN SECTION N/A 07/21/2014   Procedure: CESAREAN SECTION;  Surgeon: Farrel Gobble. Harrington Challenger, MD;  Location: [facility];  Service: Obstetrics;  Laterality: N/A;  . DILATION AND CURETTAGE OF UTERUS    . KNEE ARTHROSCOPY    . TONSILLECTOMY    . TUBAL LIGATION    . WISDOM TOOTH EXTRACTION      Social History   Socioeconomic History  . Marital status: Single    Spouse name: Not on file  . Number of children: Not on file  . Years of education: Not on file  . Highest  education level: Not on file  Occupational History  . Not on file  Social Needs  . Financial resource strain: Not on file  . Food insecurity    Worry: Not on file    Inability: Not on file  . Transportation needs    Medical: Not on file    Non-medical: Not on file  Tobacco Use  . Smoking status: Never Smoker  . Smokeless tobacco: Never Used  Substance and Sexual Activity  . Alcohol use: No  . Drug use: No  . Sexual activity: Not on file  Lifestyle  . Physical activity    Days per week: Not on file    Minutes per session: Not on file  . Stress: Not on file  Relationships  . Social Herbalist on phone: Not on file    Gets together: Not on file    Attends religious service: Not on file    Active member of club or organization: Not on file    Attends meetings of clubs or organizations: Not on file    Relationship status: Not on file  . Intimate partner violence    Fear of current or ex partner: Not on file    Emotionally abused: Not on file    Physically abused: Not on file    Forced sexual activity: Not on file  Other Topics Concern  . Not on  file  Social History Narrative  . Not on file    No current facility-administered medications on file prior to encounter.    Current Outpatient Medications on File Prior to Encounter  Medication Sig Dispense Refill  . ESTARYLLA 0.25-35 MG-MCG tablet Take 1 tablet by mouth daily.    . ferrous sulfate 325 (65 FE) MG tablet Take 325 mg by mouth daily.    . norethindrone (MICRONOR) 0.35 MG tablet Take 0.35 mg by mouth daily.    Marland Kitchen oseltamivir (TAMIFLU) 75 MG capsule Take 1 capsule (75 mg total) by mouth every 12 (twelve) hours. (Patient not taking: Reported on 03/24/2019) 10 capsule 0    Allergies  Allergen Reactions  . Lemon Juice Itching and Other (See Comments)    Tongue swelling  . Orange Juice Itching and Other (See Comments)    Tongue swelling  . Citrus Itching and Swelling    Tongue swelling  . Penicillins  Itching    DID THE REACTION INVOLVE: Swelling of the face/tongue/throat, SOB, or low BP? No Sudden or severe rash/hives, skin peeling, or the inside of the mouth or nose? No Did it require medical treatment? No When did it last happen? If all above answers are "NO", may proceed with cephalosporin use.   . Sulfa Antibiotics Itching  . Amoxicillin Rash  . Latex Itching and Rash    There were no vitals filed for this visit.  Lungs: clear to ascultation Cor:  RRR Abdomen:  soft, nontender, nondistended. Ex:  no cords, erythema Pelvic:   Vulva: no masses, no atrophy, no lesions Vagina: no tenderness, no erythema, no abnormal vaginal discharge, no vesicle(s) or ulcers, no cystocele, no rectocele Cervix: grossly normal, no discharge, no cervical motion tenderness Uterus: normal size (7), normal shape, midline, no uterine prolapse, non-tender Bladder/Urethra: normal meatus, no urethral discharge, no urethral mass, bladder non distended, Urethra well supported Adnexa/Parametria: no parametrial tenderness, no parametrial mass, no adnexal tenderness, no ovarian mass   A:  For d&c, hysteroscopy, Novasure.  Pt has BTL.   P: P: All risks, benefits and alternatives d/w patient and she desires to proceed.  Patient has undergone a modified diet, ERAS protocol and SCDs during the operation.   Loney Laurence

## 2019-03-31 ENCOUNTER — Other Ambulatory Visit: Payer: Self-pay

## 2019-03-31 ENCOUNTER — Ambulatory Visit (HOSPITAL_COMMUNITY): Payer: Commercial Managed Care - PPO | Admitting: Anesthesiology

## 2019-03-31 ENCOUNTER — Encounter (HOSPITAL_COMMUNITY): Payer: Self-pay | Admitting: *Deleted

## 2019-03-31 ENCOUNTER — Encounter (HOSPITAL_COMMUNITY)
Admission: RE | Disposition: A | Discharge status: Home / Self Care | Payer: Self-pay | Source: Home / Self Care | Attending: Obstetrics and Gynecology

## 2019-03-31 ENCOUNTER — Ambulatory Visit (HOSPITAL_COMMUNITY)
Admission: RE | Admit: 2019-03-31 | Discharge: 2019-03-31 | Disposition: A | Discharge status: Home / Self Care | Payer: Commercial Managed Care - PPO | Attending: Obstetrics and Gynecology | Admitting: Obstetrics and Gynecology

## 2019-03-31 DIAGNOSIS — Z91018 Allergy to other foods: Secondary | ICD-10-CM | POA: Insufficient documentation

## 2019-03-31 DIAGNOSIS — E669 Obesity, unspecified: Secondary | ICD-10-CM | POA: Insufficient documentation

## 2019-03-31 DIAGNOSIS — Z882 Allergy status to sulfonamides status: Secondary | ICD-10-CM | POA: Insufficient documentation

## 2019-03-31 DIAGNOSIS — Z880 Allergy status to penicillin: Secondary | ICD-10-CM | POA: Insufficient documentation

## 2019-03-31 DIAGNOSIS — Z91040 Latex allergy status: Secondary | ICD-10-CM | POA: Diagnosis not present

## 2019-03-31 DIAGNOSIS — I10 Essential (primary) hypertension: Secondary | ICD-10-CM | POA: Insufficient documentation

## 2019-03-31 DIAGNOSIS — Z79899 Other long term (current) drug therapy: Secondary | ICD-10-CM | POA: Diagnosis not present

## 2019-03-31 DIAGNOSIS — N920 Excessive and frequent menstruation with regular cycle: Secondary | ICD-10-CM | POA: Insufficient documentation

## 2019-03-31 DIAGNOSIS — N840 Polyp of corpus uteri: Secondary | ICD-10-CM | POA: Diagnosis not present

## 2019-03-31 HISTORY — PX: DILITATION & CURRETTAGE/HYSTROSCOPY WITH NOVASURE ABLATION: SHX5568

## 2019-03-31 LAB — PREGNANCY, URINE: Preg Test, Ur: NEGATIVE

## 2019-03-31 SURGERY — DILATATION & CURETTAGE/HYSTEROSCOPY WITH NOVASURE ABLATION
Anesthesia: General | Site: Vagina

## 2019-03-31 MED ORDER — LIDOCAINE 2% (20 MG/ML) 5 ML SYRINGE
INTRAMUSCULAR | Status: AC
  Filled 2019-03-31: qty 5

## 2019-03-31 MED ORDER — PROPOFOL 10 MG/ML IV BOLUS
INTRAVENOUS | Status: AC
  Filled 2019-03-31: qty 40

## 2019-03-31 MED ORDER — PHENYLEPHRINE 40 MCG/ML (10ML) SYRINGE FOR IV PUSH (FOR BLOOD PRESSURE SUPPORT)
PREFILLED_SYRINGE | INTRAVENOUS | Status: DC | PRN
  Administered 2019-03-31: 40 ug via INTRAVENOUS
  Administered 2019-03-31: 80 ug via INTRAVENOUS
  Administered 2019-03-31: 40 ug via INTRAVENOUS

## 2019-03-31 MED ORDER — DEXAMETHASONE SODIUM PHOSPHATE 10 MG/ML IJ SOLN
INTRAMUSCULAR | Status: DC | PRN
  Administered 2019-03-31: 10 mg via INTRAVENOUS

## 2019-03-31 MED ORDER — ONDANSETRON HCL 4 MG/2ML IJ SOLN
INTRAMUSCULAR | Status: DC | PRN
  Administered 2019-03-31: 4 mg via INTRAVENOUS

## 2019-03-31 MED ORDER — MIDAZOLAM HCL 2 MG/2ML IJ SOLN
INTRAMUSCULAR | Status: AC
  Filled 2019-03-31: qty 2

## 2019-03-31 MED ORDER — SODIUM CHLORIDE 0.9 % IR SOLN
Status: DC | PRN
  Administered 2019-03-31: 3000 mL

## 2019-03-31 MED ORDER — FENTANYL CITRATE (PF) 100 MCG/2ML IJ SOLN
INTRAMUSCULAR | Status: DC | PRN
  Administered 2019-03-31 (×2): 50 ug via INTRAVENOUS

## 2019-03-31 MED ORDER — ONDANSETRON HCL 4 MG/2ML IJ SOLN
INTRAMUSCULAR | Status: AC
  Filled 2019-03-31: qty 2

## 2019-03-31 MED ORDER — PROPOFOL 10 MG/ML IV BOLUS
INTRAVENOUS | Status: DC | PRN
  Administered 2019-03-31: 200 mg via INTRAVENOUS

## 2019-03-31 MED ORDER — FENTANYL CITRATE (PF) 100 MCG/2ML IJ SOLN
INTRAMUSCULAR | Status: AC
  Filled 2019-03-31: qty 2

## 2019-03-31 MED ORDER — SUCCINYLCHOLINE CHLORIDE 200 MG/10ML IV SOSY
PREFILLED_SYRINGE | INTRAVENOUS | Status: AC
  Filled 2019-03-31: qty 10

## 2019-03-31 MED ORDER — PHENYLEPHRINE 40 MCG/ML (10ML) SYRINGE FOR IV PUSH (FOR BLOOD PRESSURE SUPPORT)
PREFILLED_SYRINGE | INTRAVENOUS | Status: AC
  Filled 2019-03-31: qty 10

## 2019-03-31 MED ORDER — MIDAZOLAM HCL 5 MG/5ML IJ SOLN
INTRAMUSCULAR | Status: DC | PRN
  Administered 2019-03-31: 2 mg via INTRAVENOUS

## 2019-03-31 MED ORDER — SUCCINYLCHOLINE CHLORIDE 200 MG/10ML IV SOSY
PREFILLED_SYRINGE | INTRAVENOUS | Status: DC | PRN
  Administered 2019-03-31: 140 mg via INTRAVENOUS

## 2019-03-31 MED ORDER — DEXAMETHASONE SODIUM PHOSPHATE 10 MG/ML IJ SOLN
INTRAMUSCULAR | Status: AC
  Filled 2019-03-31: qty 1

## 2019-03-31 MED ORDER — LACTATED RINGERS IV SOLN
INTRAVENOUS | Status: DC
  Administered 2019-03-31: 12:00:00 via INTRAVENOUS

## 2019-03-31 MED ORDER — SOD CITRATE-CITRIC ACID 500-334 MG/5ML PO SOLN
30.0000 mL | ORAL | Status: AC
  Administered 2019-03-31: 30 mL via ORAL
  Filled 2019-03-31: qty 30

## 2019-03-31 MED ORDER — LIDOCAINE 2% (20 MG/ML) 5 ML SYRINGE
INTRAMUSCULAR | Status: DC | PRN
  Administered 2019-03-31: 100 mg via INTRAVENOUS

## 2019-03-31 MED ORDER — FENTANYL CITRATE (PF) 100 MCG/2ML IJ SOLN: 25.0000 ug | INTRAMUSCULAR | Status: DC | PRN

## 2019-03-31 SURGICAL SUPPLY — 18 items
ABLATOR SURESOUND NOVASURE (ABLATOR) ×2 IMPLANT
BIPOLAR CUTTING LOOP 21FR (ELECTRODE)
CANISTER SUCT 3000ML PPV (MISCELLANEOUS) ×3 IMPLANT
CATH ROBINSON RED A/P 16FR (CATHETERS) ×1 IMPLANT
COVER WAND RF STERILE (DRAPES) ×3 IMPLANT
DILATOR CANAL MILEX (MISCELLANEOUS) IMPLANT
DRAPE HYSTEROSCOPY (DRAPE) ×2 IMPLANT
ELECT REM PT RETURN 15FT ADLT (MISCELLANEOUS) IMPLANT
GLOVE BIO SURGEON STRL SZ7 (GLOVE) ×3 IMPLANT
GOWN STRL REUS W/TWL XL LVL3 (GOWN DISPOSABLE) ×3 IMPLANT
KIT PROCEDURE FLUENT (KITS) ×5 IMPLANT
KIT TURNOVER KIT A (KITS) ×3 IMPLANT
LOOP CUTTING BIPOLAR 21FR (ELECTRODE) IMPLANT
PACK LITHOTOMY IV (CUSTOM PROCEDURE TRAY) ×3 IMPLANT
PAD OB MATERNITY 4.3X12.25 (PERSONAL CARE ITEMS) ×3 IMPLANT
SEAL ROD LENS SCOPE MYOSURE (ABLATOR) ×3 IMPLANT
TOWEL OR 17X26 10 PK STRL BLUE (TOWEL DISPOSABLE) ×3 IMPLANT
WATER STERILE IRR 500ML POUR (IV SOLUTION) ×3 IMPLANT

## 2019-03-31 NOTE — Op Note (Signed)
03/31/2019  12:28 PM  PATIENT:  Kimberly [name]  28 y.o. female  PRE-OPERATIVE DIAGNOSIS:  menorrhagia  POST-OPERATIVE DIAGNOSIS:  menorrhagia  PROCEDURE:  Procedure(s): DILATATION & CURETTAGE/HYSTEROSCOPY WITH NOVASURE ABLATION (N/A)  SURGEON:  Surgeon(s) and Role:    * Bobbye Charleston, MD - Primary  ANESTHESIA:   general  EBL:  25 mL   SPECIMEN:  Source of Specimen:  uterine currettings  DISPOSITION OF SPECIMEN:  PATHOLOGY  COUNTS:  YES  TOURNIQUET:  * No tourniquets in log *  DICTATION: .Note written in EPIC  PLAN OF CARE: Discharge to home after PACU  PATIENT DISPOSITION:  PACU - hemodynamically stable.   Delay start of Pharmacological VTE agent (>24hrs) due to surgical blood loss or risk of bleeding: not applicable  Findings:  Shaggy endometrium.  Medications: none.  Complications: none.  After adequate anesthesia was achieved, the patient was prepped and draped in the usual sterile fashion.  The speculum was placed in the vagina and the cervix stabilized with a single-tooth tenaculum.  The cervix was dilated with pratt dilators and the hysteroscope passed inside the endometrial cavity.  The above findings were noted and sharp curettage was then performed and uterine curettings sent to path.  The cavity length was 6.5 cm and the Novasure instrument successfully seated.  The width was 4.5 cm and the CO2 test passed.  The ablation went for 58 sec at at power of 164 watts.  Once the ablation was completed, the Novasure instrument was removed and the hysteroscope passed into the cavity again.  Good contact was seen in all areas  .   All instruments were then removed from the uterus and vagina.  The patient tolerated the procedure well.    Riann Oman A

## 2019-03-31 NOTE — Anesthesia Preprocedure Evaluation (Signed)
Anesthesia Evaluation  Patient identified by MRN, date of birth, ID band Patient awake    Reviewed: Allergy & Precautions  Airway Mallampati: II  TM Distance: >3 FB     Dental   Pulmonary pneumonia,    breath sounds clear to auscultation       Cardiovascular hypertension,  Rhythm:Regular Rate:Normal     Neuro/Psych    GI/Hepatic negative GI ROS, Neg liver ROS,   Endo/Other  negative endocrine ROS  Renal/GU negative Renal ROS     Musculoskeletal   Abdominal   Peds  Hematology  (+) anemia ,   Anesthesia Other Findings   Reproductive/Obstetrics                             Anesthesia Physical Anesthesia Plan  ASA: III  Anesthesia Plan: General   Post-op Pain Management:    Induction: Intravenous  PONV Risk Score and Plan: 3 and Ondansetron and Midazolam  Airway Management Planned: Oral ETT  Additional Equipment:   Intra-op Plan:   Post-operative Plan: Possible Post-op intubation/ventilation  Informed Consent: I have reviewed the patients History and Physical, chart, labs and discussed the procedure including the risks, benefits and alternatives for the proposed anesthesia with the patient or authorized representative who has indicated his/her understanding and acceptance.     Dental advisory given  Plan Discussed with: CRNA and Anesthesiologist  Anesthesia Plan Comments:         Anesthesia Quick Evaluation

## 2019-03-31 NOTE — Anesthesia Procedure Notes (Addendum)
Procedure Name: Intubation Date/Time: 03/31/2019 11:59 AM Performed by: Maxwell Caul, CRNA Pre-anesthesia Checklist: Patient identified, Emergency Drugs available, Suction available and Patient being monitored Patient Re-evaluated:Patient Re-evaluated prior to induction Oxygen Delivery Method: Circle system utilized Preoxygenation: Pre-oxygenation with 100% oxygen Induction Type: IV induction and Rapid sequence Laryngoscope Size: Mac and 4 Grade View: Grade II Tube type: Oral Tube size: 7.5 mm Number of attempts: 1 Airway Equipment and Method: Stylet Placement Confirmation: ETT inserted through vocal cords under direct vision,  positive ETCO2 and breath sounds checked- equal and bilateral Secured at: 21 cm Tube secured with: Tape Dental Injury: Teeth and Oropharynx as per pre-operative assessment

## 2019-03-31 NOTE — Brief Op Note (Signed)
03/31/2019  12:28 PM  PATIENT:  Alfonse Spruce  28 y.o. female  PRE-OPERATIVE DIAGNOSIS:  menorrhagia  POST-OPERATIVE DIAGNOSIS:  menorrhagia  PROCEDURE:  Procedure(s): DILATATION & CURETTAGE/HYSTEROSCOPY WITH NOVASURE ABLATION (N/A)  SURGEON:  Surgeon(s) and Role:    * Bobbye Charleston, MD - Primary  ANESTHESIA:   general  EBL:  25 mL   SPECIMEN:  Source of Specimen:  uterine currettings  DISPOSITION OF SPECIMEN:  PATHOLOGY  COUNTS:  YES  TOURNIQUET:  * No tourniquets in log *  DICTATION: .Note written in EPIC  PLAN OF CARE: Discharge to home after PACU  PATIENT DISPOSITION:  PACU - hemodynamically stable.   Delay start of Pharmacological VTE agent (>24hrs) due to surgical blood loss or risk of bleeding: not applicable

## 2019-03-31 NOTE — Progress Notes (Signed)
There has been no change in the patients history, status or exam since the history and physical.  There were no vitals filed for this visit.  No results found for this or any previous visit (from the past 72 hour(s)).  Kimberly [name]

## 2019-03-31 NOTE — Transfer of Care (Signed)
Immediate Anesthesia Transfer of Care Note  Patient: Kimberly [name]  Procedure(s) Performed: DILATATION & CURETTAGE/HYSTEROSCOPY WITH NOVASURE ABLATION (N/A Vagina )  Patient Location: PACU  Anesthesia Type:General  Level of Consciousness: awake, alert  and oriented  Airway & Oxygen Therapy: Patient Spontanous Breathing and Patient connected to face mask oxygen  Post-op Assessment: Report given to RN and Post -op Vital signs reviewed and stable  Post vital signs: Reviewed and stable  Last Vitals:  Vitals Value Taken Time  BP 130/73 03/31/19 1245  Temp 36.6 C 03/31/19 1245  Pulse 92 03/31/19 1246  Resp 25 03/31/19 1246  SpO2 100 % 03/31/19 1246  Vitals shown include unvalidated device data.  Last Pain:  Vitals:   03/31/19 1245  TempSrc:   PainSc: (P) Asleep         Complications: No apparent anesthesia complications

## 2019-03-31 NOTE — Discharge Instructions (Signed)

## 2019-03-31 NOTE — Anesthesia Postprocedure Evaluation (Signed)
Anesthesia Post Note  Patient: MARZELL ALLEMAND  Procedure(s) Performed: DILATATION & CURETTAGE/HYSTEROSCOPY WITH NOVASURE ABLATION (N/A Vagina )     Patient location during evaluation: PACU Anesthesia Type: General Level of consciousness: awake Pain management: pain level controlled Vital Signs Assessment: post-procedure vital signs reviewed and stable Respiratory status: spontaneous breathing Cardiovascular status: stable Postop Assessment: no apparent nausea or vomiting Anesthetic complications: no    Last Vitals:  Vitals:   03/31/19 1305 03/31/19 1315  BP: 99/77 109/73  Pulse: 96 86  Resp: (!) 22 (!) 21  Temp:  36.7 C  SpO2: 92% 93%    Last Pain:  Vitals:   03/31/19 1315  TempSrc:   PainSc: 0-No pain                 Walaa Carel

## 2019-04-01 ENCOUNTER — Encounter (HOSPITAL_COMMUNITY): Payer: Self-pay | Admitting: Obstetrics and Gynecology

## 2019-04-01 LAB — SURGICAL PATHOLOGY

## 2019-10-12 ENCOUNTER — Ambulatory Visit: Payer: Self-pay | Admitting: Family Medicine

## 2019-10-18 ENCOUNTER — Encounter: Payer: Self-pay | Admitting: Family Medicine

## 2019-11-10 ENCOUNTER — Other Ambulatory Visit: Payer: Self-pay

## 2019-11-10 ENCOUNTER — Ambulatory Visit (INDEPENDENT_AMBULATORY_CARE_PROVIDER_SITE_OTHER): Payer: Commercial Managed Care - PPO | Admitting: Family Medicine

## 2019-11-10 ENCOUNTER — Encounter (INDEPENDENT_AMBULATORY_CARE_PROVIDER_SITE_OTHER): Payer: Self-pay | Admitting: Family Medicine

## 2019-11-10 VITALS — BP 98/65 | HR 77 | Temp 97.9°F | Ht 67.0 in | Wt >= 6400 oz

## 2019-11-10 DIAGNOSIS — R5383 Other fatigue: Secondary | ICD-10-CM

## 2019-11-10 DIAGNOSIS — R0602 Shortness of breath: Secondary | ICD-10-CM | POA: Diagnosis not present

## 2019-11-10 DIAGNOSIS — Z6841 Body Mass Index (BMI) 40.0 and over, adult: Secondary | ICD-10-CM

## 2019-11-10 DIAGNOSIS — Z1331 Encounter for screening for depression: Secondary | ICD-10-CM | POA: Diagnosis not present

## 2019-11-10 DIAGNOSIS — Z0289 Encounter for other administrative examinations: Secondary | ICD-10-CM

## 2019-11-10 DIAGNOSIS — R030 Elevated blood-pressure reading, without diagnosis of hypertension: Secondary | ICD-10-CM | POA: Diagnosis not present

## 2019-11-10 DIAGNOSIS — E559 Vitamin D deficiency, unspecified: Secondary | ICD-10-CM | POA: Diagnosis not present

## 2019-11-10 DIAGNOSIS — Z9189 Other specified personal risk factors, not elsewhere classified: Secondary | ICD-10-CM | POA: Diagnosis not present

## 2019-11-10 NOTE — Progress Notes (Signed)
Chief Complaint:   OBESITY Kimberly [name] (MR# [id number]) is a 29 y.o. female who presents for evaluation and treatment of obesity and related comorbidities. Current BMI is Body mass index is 64.53 kg/m. Kimberly [name] has been struggling with her weight for many years and has been unsuccessful in either losing weight, maintaining weight loss, or reaching her healthy weight goal.  Kimberly [name] is currently in the action stage of change and ready to dedicate time achieving and maintaining a healthier weight. Kimberly [name] is interested in becoming our patient and working on intensive lifestyle modifications including (but not limited to) diet and exercise for weight loss.  Kimberly [name] heard about the clinic from a friend.  She works 3 am to 11:30 am.  She orders ou in the morning while at work.  She will eat a fruit cup (peaches) but no breakfast.  Around 9 am, she will have a soda.  At 11:30 am, she will get a chili cheese coney from [employer] (eat all of hotdog) and slushy.  At 6 pm, she will have 1 filet of salmon, 1/2 cup rice, and 1 cup of green beans (feels full).  Kimberly [name]'s habits were reviewed today and are as follows: Her family eats meals together, she thinks her family will eat healthier with her, her desired weight loss is 170 pounds, she has been heavy most of her life, she started gaining weight at 29 years old, her heaviest weight ever was her current weight, she is a picky eater, she craves salmon, she snacks frequently in the evenings, she skips breakfast frequently, she is frequently drinking liquids with calories, she frequently makes poor food choices, she frequently eats larger portions than normal and she struggles with emotional eating.  Depression Screen Kimberly [name]'s Food and Mood (modified PHQ-9) score was 13.  Depression screen Kimberly [name] 2/9 11/10/2019  Decreased Interest 3  Down, Depressed, Hopeless 1  PHQ - 2 Score 4  Altered sleeping 1  Tired, decreased energy 3  Change in appetite 2  Feeling bad or  failure about yourself  1  Trouble concentrating 1  Moving slowly or fidgety/restless 1  Suicidal thoughts 0  PHQ-9 Score 13  Difficult doing work/chores Somewhat difficult   Subjective:   1. Other fatigue Kimberly [name] admits to daytime somnolence and reports waking up still tired. Patent has a history of symptoms of daytime fatigue, morning fatigue and snoring. Kimberly [name] generally gets 8 hours of sleep per night, and states that she has generally restful sleep. Snoring is present. Apneic episodes are present. Epworth Sleepiness Score is 12.  2. SOB (shortness of breath) on exertion Kimberly [name] notes increasing shortness of breath with exercising and seems to be worsening over time with weight gain. She notes getting out of breath sooner with activity than she used to. This has gotten worse recently. Kimberly [name] denies shortness of breath at rest or orthopnea.  3. Elevated BP without diagnosis of hypertension Kimberly [name] had a previous elevation of blood pressure during and after pregnancy.  She was taking medication during and after pregnancy, but is no longer taking them.  Blood pressure is controlled today.  BP Readings from Last 3 Encounters:  11/10/19 98/65  03/31/19 109/73  03/25/19 (!) 142/81   4. Vitamin D deficiency She is currently taking no vitamin D supplement. Diagnosis is likely given obesity.  5. Depression screening Kimberly [name] was screened for depression as part of her new patient workup.  PHQ-9 is 13.  6. At risk for heart disease Kimberly [name] is at a  higher than average risk for cardiovascular disease due to obesity.   Assessment/Plan:   1. Other fatigue Kimberly [name] does feel that her weight is causing her energy to be lower than it should be. Fatigue may be related to obesity, depression or many other causes. Labs will be ordered, and in the meanwhile, Kimberly [name] will focus on self care including making healthy food choices, increasing physical activity and focusing on stress reduction. - EKG 12-Lead -  Comprehensive metabolic panel - CBC with Differential/Platelet - Hemoglobin A1c - Insulin, random - VITAMIN D 25 Hydroxy (Vit-D Deficiency, Fractures) - Vitamin B12 - TSH - T4 - T3  2. SOB (shortness of breath) on exertion Kimberly [name] does feel that she gets out of breath more easily that she used to when she exercises. Kimberly [name]'s shortness of breath appears to be obesity related and exercise induced. She has agreed to work on weight loss and gradually increase exercise to treat her exercise induced shortness of breath. Will continue to monitor closely. - Comprehensive metabolic panel - CBC with Differential/Platelet - Hemoglobin A1c - Insulin, random - VITAMIN D 25 Hydroxy (Vit-D Deficiency, Fractures) - Vitamin B12 - TSH - T4 - T3  3. Elevated BP without diagnosis of hypertension Will follow-up on this at next appointment. - Comprehensive metabolic panel - Lipid Panel With LDL/HDL Ratio  4. Vitamin D deficiency Low Vitamin D level contributes to fatigue and are associated with obesity, breast, and colon cancer. - VITAMIN D 25 Hydroxy (Vit-D Deficiency, Fractures) - Vitamin B12 - Folate  5. Depression screening Kimberly [name] had a positive depression screening. Depression is commonly associated with obesity and often results in emotional eating behaviors. We will monitor this closely and work on CBT to help improve the non-hunger eating patterns. Referral to Psychology may be required if no improvement is seen as she continues in our clinic.  6. At risk for heart disease Kimberly [name] was given approximately 15 minutes of coronary artery disease prevention counseling today. She is 29 y.o. female and has risk factors for heart disease including obesity. We discussed intensive lifestyle modifications today with an emphasis on specific weight loss instructions and strategies.   Repetitive spaced learning was employed today to elicit superior memory formation and behavioral change.  7. Class 3  severe obesity with serious comorbidity and body mass index (BMI) of 60.0 to 69.9 in adult, unspecified obesity type (HCC) Kimberly [name] is currently in the action stage of change and her goal is to continue with weight loss efforts. I recommend Kimberly [name] begin the structured treatment plan as follows:  She has agreed to the Category 3 Plan.  Exercise goals: No exercise has been prescribed at this time.   Behavioral modification strategies: increasing lean protein intake, increasing vegetables, meal planning and cooking strategies, keeping healthy foods in the home and planning for success.  She was informed of the importance of frequent follow-up visits to maximize her success with intensive lifestyle modifications for her multiple health conditions. She was informed we would discuss her lab results at her next visit unless there is a critical issue that needs to be addressed sooner. Kimberly [name] agreed to keep her next visit at the agreed upon time to discuss these results.  Objective:   Blood pressure 98/65, pulse 77, temperature 97.9 F (36.6 C), temperature source Oral, height 5\' 7"  (1.702 m), weight (!) 412 lb (186.9 kg), SpO2 99 %, unknown if currently breastfeeding. Body mass index is 64.53 kg/m.  EKG: Normal sinus rhythm, rate 75 bpm.  Indirect Calorimeter  completed today shows a VO2 of 298 and a REE of 2075.  Her calculated basal metabolic rate is 6433 thus her basal metabolic rate is worse than expected.  General: Cooperative, alert, well developed, in no acute distress. HEENT: Conjunctivae and lids unremarkable. Cardiovascular: Regular rhythm.  Lungs: Normal work of breathing. Neurologic: No focal deficits.   Lab Results  Component Value Date   CREATININE 0.70 03/25/2019   BUN 10 03/25/2019   NA 138 03/25/2019   K 3.8 03/25/2019   CL 103 03/25/2019   CO2 26 03/25/2019   Lab Results  Component Value Date   ALT 15 09/09/2015   AST 17 09/09/2015   ALKPHOS 67 09/09/2015   BILITOT 0.5  09/09/2015   Lab Results  Component Value Date   TSH 1.506 07/26/2014   Lab Results  Component Value Date   WBC 7.0 03/25/2019   HGB 9.2 (L) 03/25/2019   HCT 32.0 (L) 03/25/2019   MCV 69.9 (L) 03/25/2019   PLT 356 03/25/2019   Lab Results  Component Value Date   IRON 31 (L) 07/28/2014   TIBC 358 07/28/2014   FERRITIN 42 07/28/2014   Attestation Statements:   This is the patient's first visit at [facility]. The patient's NEW PATIENT PACKET was reviewed at length. Included in the packet: current and past health history, medications, allergies, ROS, gynecologic history (women only), surgical history, family history, social history, weight history, weight loss surgery history (for those that have had weight loss surgery), nutritional evaluation, mood and food questionnaire, PHQ9, Epworth questionnaire, sleep habits questionnaire, patient life and health improvement goals questionnaire. These will all be scanned into the patient's chart under media.   During the visit, I independently reviewed the patient's EKG, bioimpedance scale results, and indirect calorimeter results. I used this information to tailor a meal plan for the patient that will help her to lose weight and will improve her obesity-related conditions going forward. I performed a medically necessary appropriate examination and/or evaluation. I discussed the assessment and treatment plan with the patient. The patient was provided an opportunity to ask questions and all were answered. The patient agreed with the plan and demonstrated an understanding of the instructions. Labs were ordered at this visit and will be reviewed at the next visit unless more critical results need to be addressed immediately. Clinical information was updated and documented in the EMR.   Time spent on visit including pre-visit chart review and post-visit care was 45 minutes.   A separate 15 minutes was spent on risk counseling (see above).      I, Insurance claims handler, CMA, am acting as transcriptionist for Reuben Likes, MD.  I have reviewed the above documentation for accuracy and completeness, and I agree with the above. - Katherina Mires, MD

## 2019-11-24 ENCOUNTER — Ambulatory Visit (INDEPENDENT_AMBULATORY_CARE_PROVIDER_SITE_OTHER): Payer: Commercial Managed Care - PPO | Admitting: Family Medicine

## 2019-11-24 ENCOUNTER — Other Ambulatory Visit: Payer: Self-pay

## 2019-11-24 ENCOUNTER — Encounter (INDEPENDENT_AMBULATORY_CARE_PROVIDER_SITE_OTHER): Payer: Self-pay | Admitting: Family Medicine

## 2019-11-24 VITALS — BP 123/78 | HR 107 | Temp 98.4°F | Ht 67.0 in | Wt >= 6400 oz

## 2019-11-24 DIAGNOSIS — E559 Vitamin D deficiency, unspecified: Secondary | ICD-10-CM

## 2019-11-24 DIAGNOSIS — Z9189 Other specified personal risk factors, not elsewhere classified: Secondary | ICD-10-CM

## 2019-11-24 DIAGNOSIS — D509 Iron deficiency anemia, unspecified: Secondary | ICD-10-CM | POA: Diagnosis not present

## 2019-11-24 DIAGNOSIS — Z6841 Body Mass Index (BMI) 40.0 and over, adult: Secondary | ICD-10-CM

## 2019-11-24 NOTE — Progress Notes (Signed)
Chief Complaint:   OBESITY Kimberly [name] is here to discuss her progress with her obesity treatment plan along with follow-up of her obesity related diagnoses. Kimberly [name] is on the Category 3 Plan and states she is following her eating plan approximately 83% of the time. Kimberly [name] states she is walking 1/2 mile 7 times per week.  Today's visit was #: 2 Starting weight: 412 lbs Starting date: 11/10/2019 Today's weight: 406 lbs Today's date: 11/24/2019 Total lbs lost to date: 6 lbs Total lbs lost since last in-office visit: 6 lbs  Interim History: Kimberly [name] has fasted today in preparation for her appointment.  She is stressed from work.  She says she has worked quite a bit due to her partner being out on vacation.  She has packed her lunch and snacks and has prepped for meals.  She says she prepped protein for lunch and is often eating out for dinner.  She has nuts, dried cranberries, and rice cakes for snacks.  She says she is going to [place] for 4 days for fun at the end of the month.  Subjective:   1. Microcytic anemia Last MCV 69, H&H 9.2 and 32.0.  She is taking oral iron daily.  CBC Latest Ref Rng & Units 03/25/2019 06/12/2018 08/06/2016  WBC 4.0 - 10.5 K/uL 7.0 6.0 10.1  Hemoglobin 12.0 - 15.0 g/dL 9.2(L) 8.9(L) 9.4(L)  Hematocrit 36.0 - 46.0 % 32.0(L) 30.3(L) 29.8(L)  Platelets 150 - 400 K/uL 356 319 318   Lab Results  Component Value Date   IRON 31 (L) 07/28/2014   TIBC 358 07/28/2014   FERRITIN 42 07/28/2014   Lab Results  Component Value Date   VITAMINB12 445 07/28/2014   2. Vitamin D deficiency  Diagnosis is likely given obesity.  She is currently taking no vitamin D supplement. She denies nausea, vomiting or muscle weakness.  She endorses fatigue.  3. At risk for osteoporosis Kimberly [name] is at higher risk of osteopenia and osteoporosis due to Vitamin D deficiency.   Assessment/Plan:   1. Microcytic anemia Will check an anemia panel today, as per below. - Anemia panel  2.  Vitamin D deficiency Low Vitamin D level contributes to fatigue and are associated with obesity, breast, and colon cancer. She agrees to continue to take prescription Vitamin D @50 ,000 IU every week and will follow-up for routine testing of Vitamin D, at least 2-3 times per year to avoid over-replacement. - VITAMIN D 25 Hydroxy (Vit-D Deficiency, Fractures)  3. At risk for osteoporosis Kimberly [name] was given approximately 15 minutes of osteoporosis prevention counseling today. Kimberly [name] is at risk for osteopenia and osteoporosis due to her Vitamin D deficiency. She was encouraged to take her Vitamin D and follow her higher calcium diet and increase strengthening exercise to help strengthen her bones and decrease her risk of osteopenia and osteoporosis.  Repetitive spaced learning was employed today to elicit superior memory formation and behavioral change.  4. Class 3 severe obesity with serious comorbidity and body mass index (BMI) of 60.0 to 69.9 in adult, unspecified obesity type (HCC) Kimberly [name] is currently in the action stage of change. As such, her goal is to continue with weight loss efforts. She has agreed to the Category 3 Plan.   Exercise goals: As is.  Behavioral modification strategies: increasing lean protein intake, meal planning and cooking strategies and keeping healthy foods in the home.  Kimberly [name] has agreed to follow-up with our clinic in 2 weeks. She was informed of the importance of frequent  follow-up visits to maximize her success with intensive lifestyle modifications for her multiple health conditions.   Kimberly [name] was informed we would discuss her lab results at her next visit unless there is a critical issue that needs to be addressed sooner. Kimberly [name] agreed to keep her next visit at the agreed upon time to discuss these results.  Objective:   Blood pressure 123/78, pulse (!) 107, temperature 98.4 F (36.9 C), temperature source Oral, height 5\' 7"  (1.702 m), weight (!) 406 lb (184.2 kg),  SpO2 97 %, unknown if currently breastfeeding. Body mass index is 63.59 kg/m.  General: Cooperative, alert, well developed, in no acute distress. HEENT: Conjunctivae and lids unremarkable. Cardiovascular: Regular rhythm.  Lungs: Normal work of breathing. Neurologic: No focal deficits.   Lab Results  Component Value Date   CREATININE 0.70 03/25/2019   BUN 10 03/25/2019   NA 138 03/25/2019   K 3.8 03/25/2019   CL 103 03/25/2019   CO2 26 03/25/2019   Lab Results  Component Value Date   ALT 15 09/09/2015   AST 17 09/09/2015   ALKPHOS 67 09/09/2015   BILITOT 0.5 09/09/2015   Lab Results  Component Value Date   TSH 1.506 07/26/2014   Lab Results  Component Value Date   WBC 7.0 03/25/2019   HGB 9.2 (L) 03/25/2019   HCT 32.0 (L) 03/25/2019   MCV 69.9 (L) 03/25/2019   PLT 356 03/25/2019   Lab Results  Component Value Date   IRON 31 (L) 07/28/2014   TIBC 358 07/28/2014   FERRITIN 42 07/28/2014   Attestation Statements:   Reviewed by clinician on day of visit: allergies, medications, problem list, medical history, surgical history, family history, social history, and previous encounter notes.  I, 09/26/2014, CMA, am acting as transcriptionist for Insurance claims handler, MD.  I have reviewed the above documentation for accuracy and completeness, and I agree with the above. - Reuben Likes, MD

## 2019-11-25 LAB — FOLATE: Folate: 7.1 ng/mL (ref >3.0)

## 2019-11-25 LAB — CBC WITH DIFFERENTIAL/PLATELET
Basophils Absolute: 0 10*3/uL (ref 0.0–0.2)
Basos: 0 %
EOS (ABSOLUTE): 0.2 10*3/uL (ref 0.0–0.4)
Eos: 2 %
Hematocrit: 32 % — ABNORMAL LOW (ref 34.0–46.6)
Hemoglobin: 9.5 g/dL — ABNORMAL LOW (ref 11.1–15.9)
Immature Grans (Abs): 0.1 10*3/uL (ref 0.0–0.1)
Immature Granulocytes: 1 %
Lymphocytes Absolute: 3 10*3/uL (ref 0.7–3.1)
Lymphs: 30 %
MCH: 19.7 pg — ABNORMAL LOW (ref 26.6–33.0)
MCHC: 29.7 g/dL — ABNORMAL LOW (ref 31.5–35.7)
MCV: 66 fL — ABNORMAL LOW (ref 79–97)
Monocytes Absolute: 0.5 10*3/uL (ref 0.1–0.9)
Monocytes: 5 %
Neutrophils Absolute: 6.2 10*3/uL (ref 1.4–7.0)
Neutrophils: 62 %
Platelets: 372 10*3/uL (ref 150–450)
RBC: 4.82 x10E6/uL (ref 3.77–5.28)
RDW: 16.8 % — ABNORMAL HIGH (ref 11.7–15.4)
WBC: 10 10*3/uL (ref 3.4–10.8)

## 2019-11-25 LAB — COMPREHENSIVE METABOLIC PANEL
ALT: 11 IU/L (ref 0–32)
AST: 12 IU/L (ref 0–40)
Albumin/Globulin Ratio: 1.3 (ref 1.2–2.2)
Albumin: 4.1 g/dL (ref 3.8–4.8)
Alkaline Phosphatase: 87 IU/L (ref 48–121)
BUN/Creatinine Ratio: 10 (ref 9–23)
BUN: 8 mg/dL (ref 6–20)
Bilirubin Total: 0.4 mg/dL (ref 0.0–1.2)
CO2: 22 mmol/L (ref 20–29)
Calcium: 9.3 mg/dL (ref 8.7–10.2)
Chloride: 100 mmol/L (ref 96–106)
Creatinine, Ser: 0.8 mg/dL (ref 0.57–1.00)
GFR calc Af Amer: 114 mL/min/{1.73_m2} (ref >59)
GFR calc non Af Amer: 99 mL/min/{1.73_m2} (ref >59)
Globulin, Total: 3.1 g/dL (ref 1.5–4.5)
Glucose: 134 mg/dL — ABNORMAL HIGH (ref 65–99)
Potassium: 3.7 mmol/L (ref 3.5–5.2)
Sodium: 139 mmol/L (ref 134–144)
Total Protein: 7.2 g/dL (ref 6.0–8.5)

## 2019-11-25 LAB — VITAMIN D 25 HYDROXY (VIT D DEFICIENCY, FRACTURES)
Vit D, 25-Hydroxy: 8.6 ng/mL — ABNORMAL LOW (ref 30.0–100.0)
Vit D, 25-Hydroxy: 7 ng/mL — ABNORMAL LOW (ref 30.0–100.0)

## 2019-11-25 LAB — VITAMIN B12: Vitamin B-12: 670 pg/mL (ref 232–1245)

## 2019-11-25 LAB — LIPID PANEL WITH LDL/HDL RATIO
Cholesterol, Total: 130 mg/dL (ref 100–199)
HDL: 39 mg/dL — ABNORMAL LOW (ref >39)
LDL Chol Calc (NIH): 73 mg/dL (ref 0–99)
LDL/HDL Ratio: 1.9 ratio (ref 0.0–3.2)
Triglycerides: 96 mg/dL (ref 0–149)
VLDL Cholesterol Cal: 18 mg/dL (ref 5–40)

## 2019-11-25 LAB — TSH: TSH: 1.52 u[IU]/mL (ref 0.450–4.500)

## 2019-11-25 LAB — ANEMIA PANEL
Ferritin: 30 ng/mL (ref 15–150)
Folate, Hemolysate: 284 ng/mL
Folate, RBC: 861 ng/mL (ref >498)
Hematocrit: 33 % — ABNORMAL LOW (ref 34.0–46.6)
Iron Saturation: 10 % — ABNORMAL LOW (ref 15–55)
Iron: 35 ug/dL (ref 27–159)
Retic Ct Pct: 1.5 % (ref 0.6–2.6)
Total Iron Binding Capacity: 353 ug/dL (ref 250–450)
UIBC: 318 ug/dL (ref 131–425)
Vitamin B-12: 669 pg/mL (ref 232–1245)

## 2019-11-25 LAB — HEMOGLOBIN A1C
Est. average glucose Bld gHb Est-mCnc: 103 mg/dL
Hgb A1c MFr Bld: 5.2 % (ref 4.8–5.6)

## 2019-11-25 LAB — INSULIN, RANDOM: INSULIN: 231 u[IU]/mL — ABNORMAL HIGH (ref 2.6–24.9)

## 2019-11-25 LAB — T4: T4, Total: 7.7 ug/dL (ref 4.5–12.0)

## 2019-11-25 LAB — T3: T3, Total: 103 ng/dL (ref 71–180)

## 2019-12-07 ENCOUNTER — Other Ambulatory Visit (INDEPENDENT_AMBULATORY_CARE_PROVIDER_SITE_OTHER): Payer: Self-pay | Admitting: Family Medicine

## 2019-12-07 ENCOUNTER — Encounter (INDEPENDENT_AMBULATORY_CARE_PROVIDER_SITE_OTHER): Payer: Self-pay | Admitting: Family Medicine

## 2019-12-07 ENCOUNTER — Other Ambulatory Visit: Payer: Self-pay

## 2019-12-07 ENCOUNTER — Ambulatory Visit (INDEPENDENT_AMBULATORY_CARE_PROVIDER_SITE_OTHER): Payer: Commercial Managed Care - PPO | Admitting: Family Medicine

## 2019-12-07 VITALS — BP 91/64 | HR 100 | Temp 98.3°F | Ht 67.0 in | Wt >= 6400 oz

## 2019-12-07 DIAGNOSIS — Z9189 Other specified personal risk factors, not elsewhere classified: Secondary | ICD-10-CM | POA: Diagnosis not present

## 2019-12-07 DIAGNOSIS — E559 Vitamin D deficiency, unspecified: Secondary | ICD-10-CM

## 2019-12-07 DIAGNOSIS — E8881 Metabolic syndrome: Secondary | ICD-10-CM

## 2019-12-07 DIAGNOSIS — O926 Galactorrhea: Secondary | ICD-10-CM | POA: Diagnosis not present

## 2019-12-07 DIAGNOSIS — D509 Iron deficiency anemia, unspecified: Secondary | ICD-10-CM

## 2019-12-07 DIAGNOSIS — Z6841 Body Mass Index (BMI) 40.0 and over, adult: Secondary | ICD-10-CM

## 2019-12-07 DIAGNOSIS — N643 Galactorrhea not associated with childbirth: Secondary | ICD-10-CM

## 2019-12-07 MED ORDER — VITAMIN D (ERGOCALCIFEROL) 1.25 MG (50000 UNIT) PO CAPS: 50000.0000 [IU] | ORAL_CAPSULE | ORAL | 0 refills | Status: AC

## 2019-12-08 ENCOUNTER — Telehealth: Payer: Self-pay | Admitting: Hematology

## 2019-12-08 ENCOUNTER — Ambulatory Visit (INDEPENDENT_AMBULATORY_CARE_PROVIDER_SITE_OTHER): Payer: Commercial Managed Care - PPO | Admitting: Family Medicine

## 2019-12-08 LAB — PROLACTIN: Prolactin: 28.1 ng/mL — ABNORMAL HIGH (ref 4.8–23.3)

## 2019-12-08 NOTE — Telephone Encounter (Signed)
Received a new hem referral from Dr. Rinaldo Ratel for microcytic anemia. Kimberly [name] has been cld and scheduled to see Dr. Candise Che on 7/8 at 1pm. Pt aware to arrive 15 minutes early.

## 2019-12-08 NOTE — Progress Notes (Signed)
Chief Complaint:   OBESITY Kimberly [name] is here to discuss her progress with her obesity treatment plan along with follow-up of her obesity related diagnoses. Kimberly [name] is on the Category 3 Plan and states she is following her eating plan approximately 75% of the time. Kimberly [name] states she is exercising for 0 minutes 0 times per week.  Today's visit was #: 3 Starting weight: 412 lbs Starting date: 11/10/2019 Today's weight: 407 lbs Today's date: 12/07/2019 Total lbs lost to date: 5 lbs Total lbs lost since last in-office visit: 0  Interim History: Kimberly [name] says she has been doing the meal plan for the majority of the time.  Breakfast is string cheese, multigrain bread, and chicken sausage.  For lunch, she is having nuts and sometimes a protein bar.  For dinner, salmon or steak, green beans, and 5 shrimp.  She says she is weighing her meat.  Subjective:   1. Inappropriate lactation [name] delivered her last child over 5 years ago and is still lactating.  She breastfed for 7 months and then stopped.  She has to express milk prior to being intimate.  2. Vitamin D deficiency Kimberly [name]'s Vitamin D level was 7.0 on 11/24/2019. She is currently taking prescription vitamin D 50,000 IU each week. She denies nausea, vomiting or muscle weakness.  She endorses fatigue.  3. Insulin resistance Kimberly [name] has a diagnosis of insulin resistance based on her elevated fasting insulin level >5. She continues to work on diet and exercise to decrease her risk of diabetes.  Summar is on no medications to explain the significant elevation in insulin.  Lab Results  Component Value Date   INSULIN 231.0 (H) 11/24/2019   Lab Results  Component Value Date   HGBA1C 5.2 11/24/2019   4. Microcytic anemia Kimberly [name] is not a vegetarian.  She does not have a history of weight loss surgery.  MCV 66, H& H 9.5/32.0.  No history of blood dyscrasia.  She is unsure if she has a history of sickle cell trait (father had trait).  CBC Latest  Ref Rng & Units 11/24/2019 11/24/2019 03/25/2019  WBC 3.4 - 10.8 x10E3/uL - 10.0 7.0  Hemoglobin 11.1 - 15.9 g/dL - 9.5(L) 9.2(L)  Hematocrit 34.0 - 46.6 % 33.0(L) 32.0(L) 32.0(L)  Platelets 150 - 450 x10E3/uL - 372 356   Lab Results  Component Value Date   IRON 35 11/24/2019   TIBC 353 11/24/2019   FERRITIN 30 11/24/2019   Lab Results  Component Value Date   VITAMINB12 669 11/24/2019   5. At risk for osteoporosis Kimberly [name] is at higher risk of osteopenia and osteoporosis due to Vitamin D deficiency.   Assessment/Plan:   1. Inappropriate lactation Refer to Dr. Cruzita Lederer at [facility]. - Prolactin - Ambulatory referral to Endocrinology  2. Vitamin D deficiency Low Vitamin D level contributes to fatigue and are associated with obesity, breast, and colon cancer. She agrees to continue to take prescription Vitamin D @50 ,000 IU every week and will follow-up for routine testing of Vitamin D, at least 2-3 times per year to avoid over-replacement. - Vitamin D, Ergocalciferol, (DRISDOL) 1.25 MG (50000 UNIT) CAPS capsule; Take 1 capsule (50,000 Units total) by mouth every 7 (seven) days.  Dispense: 4 capsule; Refill: 0  3. Insulin resistance Kimberly [name] will continue to work on weight loss, exercise, and decreasing simple carbohydrates to help decrease the risk of diabetes. Kimberly [name] agreed to follow-up with [place] as directed to closely monitor her progress.  Refer to Dr. Cruzita Lederer at [facility]  Endocrin[facility]. - Ambulatory referral to Endocrinology - Ambulatory referral to Endocrinology  4. Microcytic anemia Orders and follow up as documented in patient record.  Will refer to Hematology/Oncology.  Counseling . Iron is essential for our bodies to make red blood cells.  Reasons that someone may be deficient include: an iron-deficient diet (more likely in those following vegan or vegetarian diets), women with heavy menses, patients with GI disorders or poor absorption, patients that have had bariatric  surgery, frequent blood donors, patients with cancer, and patients with heart disease.   Kimberly [name] foods include dark leafy greens, red and white meats, eggs, seafood, and beans.   . Certain foods and drinks prevent your body from absorbing iron properly. Avoid eating these foods in the same meal as iron-rich foods or with iron supplements. These foods include: coffee, black tea, and red wine; milk, dairy products, and foods that are high in calcium; beans and soybeans; whole grains.  . Constipation can be a side effect of iron supplementation. Increased water and fiber intake are helpful. Water goal: > 2 liters/day. Fiber goal: > 25 grams/day. - Ambulatory referral to Hematology / Oncology  5. At risk for osteoporosis Kimberly [name] was given approximately 15 minutes of osteoporosis prevention counseling today. Kimberly [name] is at risk for osteopenia and osteoporosis due to her Vitamin D deficiency. She was encouraged to take her Vitamin D and follow her higher calcium diet and increase strengthening exercise to help strengthen her bones and decrease her risk of osteopenia and osteoporosis.  Repetitive spaced learning was employed today to elicit superior memory formation and behavioral change.  6. Class 3 severe obesity with serious comorbidity and body mass index (BMI) of 60.0 to 69.9 in adult, unspecified obesity type (HCC) Kimberly [name] is currently in the action stage of change. As such, her goal is to continue with weight loss efforts. She has agreed to the Category 3 Plan.   Exercise goals: No exercise has been prescribed at this time.  Behavioral modification strategies: increasing lean protein intake, meal planning and cooking strategies, keeping healthy foods in the home and planning for success.  Kimberly [name] has agreed to follow-up with our clinic in 2 weeks. She was informed of the importance of frequent follow-up visits to maximize her success with intensive lifestyle modifications for her multiple health  conditions.   Objective:   Blood pressure 91/64, pulse 100, temperature 98.3 F (36.8 C), temperature source Oral, height 5\' 7"  (1.702 m), weight (!) 407 lb (184.6 kg), SpO2 97 %, unknown if currently breastfeeding. Body mass index is 63.75 kg/m.  General: Cooperative, alert, well developed, in no acute distress. HEENT: Conjunctivae and lids unremarkable. Cardiovascular: Regular rhythm.  Lungs: Normal work of breathing. Neurologic: No focal deficits.   Lab Results  Component Value Date   CREATININE 0.80 11/24/2019   BUN 8 11/24/2019   NA 139 11/24/2019   K 3.7 11/24/2019   CL 100 11/24/2019   CO2 22 11/24/2019   Lab Results  Component Value Date   ALT 11 11/24/2019   AST 12 11/24/2019   ALKPHOS 87 11/24/2019   BILITOT 0.4 11/24/2019   Lab Results  Component Value Date   HGBA1C 5.2 11/24/2019   Lab Results  Component Value Date   INSULIN 231.0 (H) 11/24/2019   Lab Results  Component Value Date   TSH 1.520 11/24/2019   Lab Results  Component Value Date   CHOL 130 11/24/2019   HDL 39 (L) 11/24/2019   LDLCALC 73 11/24/2019   TRIG  96 11/24/2019   Lab Results  Component Value Date   WBC 10.0 11/24/2019   HGB 9.5 (L) 11/24/2019   HCT 33.0 (L) 11/24/2019   MCV 66 (L) 11/24/2019   PLT 372 11/24/2019   Lab Results  Component Value Date   IRON 35 11/24/2019   TIBC 353 11/24/2019   FERRITIN 30 11/24/2019   Attestation Statements:   Reviewed by clinician on day of visit: allergies, medications, problem list, medical history, surgical history, family history, social history, and previous encounter notes.  I, Insurance claims handler, CMA, am acting as transcriptionist for Reuben Likes, MD.  I have reviewed the above documentation for accuracy and completeness, and I agree with the above. - Katherina Mires, MD

## 2019-12-15 ENCOUNTER — Ambulatory Visit (HOSPITAL_COMMUNITY): Payer: Commercial Managed Care - PPO | Attending: Cardiovascular Disease

## 2019-12-15 ENCOUNTER — Other Ambulatory Visit: Payer: Self-pay

## 2019-12-15 DIAGNOSIS — R0602 Shortness of breath: Secondary | ICD-10-CM | POA: Insufficient documentation

## 2019-12-21 ENCOUNTER — Ambulatory Visit: Payer: Commercial Managed Care - PPO | Admitting: Internal Medicine

## 2019-12-21 ENCOUNTER — Ambulatory Visit (INDEPENDENT_AMBULATORY_CARE_PROVIDER_SITE_OTHER): Payer: Commercial Managed Care - PPO | Admitting: Family Medicine

## 2019-12-21 DIAGNOSIS — Z0289 Encounter for other administrative examinations: Secondary | ICD-10-CM

## 2019-12-21 NOTE — Progress Notes (Deleted)
Name: Kimberly [name]  MRN/ DOB: [id number], 10-Feb-1991    Age/ Sex: 29 y.o., female    PCP: Leonard Downing   Reason for Endocrinology Evaluation: Hyperprolactinemia      Date of Initial Endocrinology Evaluation: 12/21/2019     HPI: [name] is a 29 y.o. female with a past medical history of obesity . The patient presented for initial endocrinology clinic visit on 12/21/2019 for consultative assistance with her insulin resistance.   Pt was referred here by the [facility] for galactorrhea, and elevated prolactin level at 28.1 ng/mL     She had her last child ~ 5 yrs ago, nursed for ~ 7 months at the time.      For insulin resistance with an insulin level of  231 uIU/mL with a serum glucose of 134 mg/dl drawn at 9562. A1c 5.2%.      HISTORY:  Past Medical History:  Past Medical History:  Diagnosis Date   Anemia    Back pain    Hypertension    Multiple food allergies    Obesity    Ovarian cyst    left side   Pneumonia    Pregnancy induced hypertension    Pregnant     Past Surgical History:  Past Surgical History:  Procedure Laterality Date   CESAREAN SECTION  04/09/2011   Procedure: CESAREAN SECTION;  Surgeon: Loney Laurence;  Location: [facility];  Service: Gynecology;  Laterality: N/A;   CESAREAN SECTION N/A 07/21/2014   Procedure: CESAREAN SECTION;  Surgeon: Freddrick March. Tenny Craw, MD;  Location: [facility];  Service: Obstetrics;  Laterality: N/A;   DILATION AND CURETTAGE OF UTERUS     DILITATION & CURRETTAGE/HYSTROSCOPY WITH NOVASURE ABLATION N/A 03/31/2019   Procedure: DILATATION & CURETTAGE/HYSTEROSCOPY WITH NOVASURE ABLATION;  Surgeon: Carrington Clamp, MD;  Location: [facility];  Service: Gynecology;  Laterality: N/A;   KNEE ARTHROSCOPY     TONSILLECTOMY     TUBAL LIGATION     WISDOM TOOTH EXTRACTION        Social History:  reports that she has never smoked. She has never used smokeless tobacco. She reports that she  does not drink alcohol and does not use drugs.  Family History: family history includes Diabetes in her maternal grandfather, maternal grandmother, mother, and paternal grandmother; Stroke in her paternal grandmother.   HOME MEDICATIONS: Allergies as of 12/21/2019      Reactions   Lemon Juice Itching, Other (See Comments)   Tongue swelling   Orange Juice Itching, Other (See Comments)   Tongue swelling   Citrus Itching, Swelling   Tongue swelling   Penicillins Itching   DID THE REACTION INVOLVE: Swelling of the face/tongue/throat, SOB, or low BP? No Sudden or severe rash/hives, skin peeling, or the inside of the mouth or nose? No Did it require medical treatment? No When did it last happen? If all above answers are NO, may proceed with cephalosporin use.   Sulfa Antibiotics Itching   Amoxicillin Rash   Latex Itching, Rash      Medication List       Accurate as of December 21, 2019 12:21 PM. If you have any questions, ask your nurse or doctor.        ferrous sulfate 325 (65 FE) MG tablet Take 325 mg by mouth daily.   Vitamin D (Ergocalciferol) 1.25 MG (50000 UNIT) Caps capsule Commonly known as: DRISDOL Take 1 capsule (50,000 Units total) by mouth every 7 (  seven) days.         REVIEW OF SYSTEMS: A comprehensive ROS was conducted with the patient and is negative except as per HPI and below:  ROS     OBJECTIVE:  VS: There were no vitals taken for this visit.   Wt Readings from Last 3 Encounters:  12/07/19 (!) 407 lb (184.6 kg)  11/24/19 (!) 406 lb (184.2 kg)  11/10/19 (!) 412 lb (186.9 kg)     EXAM: General: Pt appears well and is in NAD  Hydration: Well-hydrated with moist mucous membranes and good skin turgor  Eyes: External eye exam normal without stare, lid lag or exophthalmos.  EOM intact.  PERRL.  Ears, Nose, Throat: Hearing: Grossly intact bilaterally Dental: Good dentition  Throat: Clear without mass, erythema or exudate  Neck: General: Supple  without adenopathy. Thyroid: Thyroid size normal.  No goiter or nodules appreciated. No thyroid bruit.  Lungs: Clear with good BS bilat with no rales, rhonchi, or wheezes  Heart: Auscultation: RRR.  Abdomen: Normoactive bowel sounds, soft, nontender, without masses or organomegaly palpable  Extremities: Gait and station: Normal gait  Digits and nails: No clubbing, cyanosis, petechiae, or nodes Head and neck: Normal alignment and mobility BL UE: Normal ROM and strength. BL LE: No pretibial edema normal ROM and strength.  Skin: Hair: Texture and amount normal with gender appropriate distribution Skin Inspection: No rashes, acanthosis nigricans/skin tags. No lipohypertrophy Skin Palpation: Skin temperature, texture, and thickness normal to palpation  Neuro: Cranial nerves: II - XII grossly intact  Cerebellar: Normal coordination and movement; no tremor Motor: Normal strength throughout DTRs: 2+ and symmetric in UE without delay in relaxation phase  Mental Status: Judgment, insight: Intact Orientation: Oriented to time, place, and person Memory: Intact for recent and remote events Mood and affect: No depression, anxiety, or agitation     DATA REVIEWED: ***    ASSESSMENT/PLAN/RECOMMENDATIONS:   1. Insulin Resistance :   Weight loss is associated with a decrease in insulin concentration and an increase in insulin sensitivity in adults.     Medications :  Signed electronically by: Lyndle Herrlich, MD  [facility]  [facility] [address] [place], [place] [address] Phone: [phone] FAX: [phone]   CC: Leonard Downing [address] [place] [address] Phone: [phone] Fax: [phone]   Return to Endocrinology clinic as below: Future Appointments  Date Time Provider Department Center  12/21/2019  1:40 PM Alaric Gladwin, Konrad Dolores, MD [facility]  12/21/2019  4:20 PM Filbert Schilder, MD  [facility] None  12/23/2019  1:00 PM Johney Maine, MD [facility] None

## 2019-12-23 ENCOUNTER — Telehealth: Payer: Self-pay | Admitting: *Deleted

## 2019-12-23 ENCOUNTER — Inpatient Hospital Stay: Payer: Commercial Managed Care - PPO | Attending: Hematology | Admitting: Hematology

## 2019-12-23 NOTE — Telephone Encounter (Signed)
Referred by Dr. Rinaldo Ratel to see Dr. Candise Che 12/23/19 at 1pm -  did not come to appointment. New Patient coordinator informed

## 2019-12-23 NOTE — Telephone Encounter (Addendum)
New Patient scheduler contacted office to report patient called on 7/8 after 1pm and asked to reschedule today's appointment

## 2019-12-28 ENCOUNTER — Ambulatory Visit (INDEPENDENT_AMBULATORY_CARE_PROVIDER_SITE_OTHER): Payer: Commercial Managed Care - PPO | Admitting: Family Medicine

## 2020-01-11 ENCOUNTER — Encounter: Payer: Self-pay | Admitting: Internal Medicine

## 2020-01-11 ENCOUNTER — Other Ambulatory Visit: Payer: Self-pay

## 2020-01-11 ENCOUNTER — Ambulatory Visit (INDEPENDENT_AMBULATORY_CARE_PROVIDER_SITE_OTHER): Payer: Commercial Managed Care - PPO | Admitting: Internal Medicine

## 2020-01-11 VITALS — BP 138/82 | HR 85 | Ht 67.0 in | Wt >= 6400 oz

## 2020-01-11 DIAGNOSIS — E221 Hyperprolactinemia: Secondary | ICD-10-CM

## 2020-01-11 DIAGNOSIS — N643 Galactorrhea not associated with childbirth: Secondary | ICD-10-CM | POA: Diagnosis not present

## 2020-01-11 DIAGNOSIS — E8881 Metabolic syndrome: Secondary | ICD-10-CM

## 2020-01-11 DIAGNOSIS — E88819 Insulin resistance, unspecified: Secondary | ICD-10-CM

## 2020-01-11 LAB — CORTISOL: Cortisol, Plasma: 8.3 ug/dL

## 2020-01-11 LAB — FOLLICLE STIMULATING HORMONE: FSH: 1.2 m[IU]/mL

## 2020-01-11 LAB — LUTEINIZING HORMONE: LH: 1.81 m[IU]/mL

## 2020-01-11 MED ORDER — CABERGOLINE 0.5 MG PO TABS: 0.2500 mg | ORAL_TABLET | ORAL | 1 refills | Status: AC

## 2020-01-11 NOTE — Progress Notes (Signed)
Name: Kimberly [name]  MRN/ DOB: [id number], December 10, 1990    Age/ Sex: 29 y.o., female    PCP: Kimberly [name]   Reason for Endocrinology Evaluation: Hyperprolactinemia      Date of Initial Endocrinology Evaluation: 01/11/2020     HPI: [name] is a 29 y.o. female with a past medical history of obesity . The patient presented for initial endocrinology clinic visit on 01/11/2020 for consultative assistance with her insulin resistance.   Pt was referred here by the [facility] for galactorrhea, and elevated prolactin level at 28.1 ng/mL     Pt presented with galactorrhea since the delivery of her son in ~ 2016. She breastfed him for ~7 month but galactorrhea persisted. The galactorhea is bilateral  And spontaneous at times. No prior mammograms, no pain .   She used to have heavy menstruation but this stopped after endometrial ablation in 03/2019.   Denies OCP use Denies headaches or vision changes    Of note, she has been diagnosed with  insulin resistance with an insulin level of  231 uIU/mL with a serum glucose of 134 mg/dl drawn at 6834. A1c 5.2%. Through the wellness clinic.  She has been on meal prep and has been exercising   HISTORY:  Past Medical History:  Past Medical History:  Diagnosis Date  . Anemia   . Back pain   . Hypertension   . Multiple food allergies   . Obesity   . Ovarian cyst    left side  . Pneumonia   . Pregnancy induced hypertension   . Pregnant    Past Surgical History:  Past Surgical History:  Procedure Laterality Date  . CESAREAN SECTION  04/09/2011   Procedure: CESAREAN SECTION;  Surgeon: Kimberly [name];  Location: [facility];  Service: Gynecology;  Laterality: N/A;  . CESAREAN SECTION N/A 07/21/2014   Procedure: CESAREAN SECTION;  Surgeon: Kimberly March. Tenny Craw, MD;  Location: [facility];  Service: Obstetrics;  Laterality: N/A;  . DILATION AND CURETTAGE OF UTERUS    . DILITATION & CURRETTAGE/HYSTROSCOPY WITH NOVASURE  ABLATION N/A 03/31/2019   Procedure: DILATATION & CURETTAGE/HYSTEROSCOPY WITH NOVASURE ABLATION;  Surgeon: Kimberly Clamp, MD;  Location: [facility];  Service: Gynecology;  Laterality: N/A;  . KNEE ARTHROSCOPY    . TONSILLECTOMY    . TUBAL LIGATION    . WISDOM TOOTH EXTRACTION        Social History:  reports that she has never smoked. She has never used smokeless tobacco. She reports that she does not drink alcohol and does not use drugs.  Family History: family history includes Diabetes in her maternal grandfather, maternal grandmother, mother, and paternal grandmother; Stroke in her paternal grandmother.   HOME MEDICATIONS: Allergies as of 01/11/2020      Reactions   Lemon Juice Itching, Other (See Comments)   Tongue swelling   Orange Juice Itching, Other (See Comments)   Tongue swelling   Citrus Itching, Swelling   Tongue swelling   Penicillins Itching   DID THE REACTION INVOLVE: Swelling of the face/tongue/throat, SOB, or low BP? No Sudden or severe rash/hives, skin peeling, or the inside of the mouth or nose? No Did it require medical treatment? No When did it last happen? If all above answers are "NO", may proceed with cephalosporin use.   Sulfa Antibiotics Itching   Amoxicillin Rash   Latex Itching, Rash      Medication List       Accurate  as of January 11, 2020  1:33 PM. If you have any questions, ask your nurse or doctor.        cabergoline 0.5 MG tablet Commonly known as: DOSTINEX Take 0.5 tablets (0.25 mg total) by mouth 2 (two) times a week. Start taking on: January 13, 2020 Started by: Kimberly Shorts, MD   ferrous sulfate 325 (65 FE) MG tablet Take 325 mg by mouth daily.   Vitamin D (Ergocalciferol) 1.25 MG (50000 UNIT) Caps capsule Commonly known as: DRISDOL Take 1 capsule (50,000 Units total) by mouth every 7 (seven) days.         REVIEW OF SYSTEMS: A comprehensive ROS was conducted with the patient and is negative except as per HPI and  below:  ROS     OBJECTIVE:  VS: BP (!) 138/82 (BP Location: Right Arm, Patient Position: Sitting, Cuff Size: Large)   Pulse 85   Ht 5\' 7"  (1.702 m)   Wt (!) 412 lb 12.8 oz (187.2 kg)   SpO2 97%   BMI 64.65 kg/m    Wt Readings from Last 3 Encounters:  01/11/20 (!) 412 lb 12.8 oz (187.2 kg)  12/07/19 (!) 407 lb (184.6 kg)  11/24/19 (!) 406 lb (184.2 kg)     EXAM: General: Pt appears well and is in NAD  Neck: General: Supple without adenopathy. Thyroid: Thyroid size normal.  No goiter or nodules appreciated. No thyroid bruit.  Breast:  Bilateral milky discharge at both nipples  Lungs: Clear with good BS bilat with no rales, rhonchi, or wheezes  Heart: Auscultation: RRR.  Abdomen: Normoactive bowel sounds, soft, nontender, without masses or organomegaly palpable  Extremities:  BL LE: Trace pretibial edema  Skin: Hair: Texture and amount normal with gender appropriate distribution Skin Inspection: No rashes Skin Palpation: Skin temperature, texture, and thickness normal to palpation  Neuro: Cranial nerves: II - XII grossly intact  Motor: Normal strength throughout DTRs: 2+ and symmetric in UE without delay in relaxation phase  Mental Status: Judgment, insight: Intact Orientation: Oriented to time, place, and person Mood and affect: No depression, anxiety, or agitation     DATA REVIEWED:   Results for Kimberly [name], Kimberly [name] (MRN [name]) as of 01/12/2020 13:00  Ref. Range 12/07/2019 14:29 01/11/2020 13:29  Cortisol, Plasma Latest Units: ug/dL  8.3  LH Latest Units: mIU/mL  1.81  FSH Latest Units: mIU/ML  1.2  Prolactin Latest Units: ng/mL 28.1 (H) 21.2    ASSESSMENT/PLAN/RECOMMENDATIONS:   1. Galactorrhea     - Pt with galactorrhea for the past ~5 yrs  - TSH normal - Slight elevation in Prolactin level in the past, but repeat level are normal.  - Will start cabergoline as below ( for symptomatic relief) , discussed GI side effects  -  LH, FSH,and cortisol  Are  normal. ACTH- pending   Medication Cabergoline 0.5 mg, Half a tablet twice weekly    2.Insulin Resistance :   Weight loss is associated with a decrease in insulin concentration and an increase in insulin sensitivity in adults.  We discussed lifestyle changes are main stay of treatment. We also discussed option to improve insulin resistance with metformin and pioglitazone. Will defer to weight loss clinic.      F/U in 3 months   Addendum: Discussed labs with the pt on 01/12/2020 at 1500  Signed electronically by: 01/14/2020, MD  [facility] Endocrinology  [facility] [address] [place], [place] Phone: [phone] FAX: [phone]  CC: Nathaneil Canary, PA-C [address] [place] [address] Phone: [phone] Fax: [phone]   Return to Endocrinology clinic as below: No future appointments.

## 2020-01-11 NOTE — Patient Instructions (Signed)
-   Cabergoline HALF a tablet twice a week

## 2020-01-12 DIAGNOSIS — N643 Galactorrhea not associated with childbirth: Secondary | ICD-10-CM | POA: Insufficient documentation

## 2020-01-13 LAB — PROLACTIN: Prolactin: 21.2 ng/mL

## 2020-01-13 LAB — ACTH: C206 ACTH: 9 pg/mL (ref 6–50)

## 2020-01-13 LAB — ESTRADIOL: Estradiol: 165 pg/mL

## 2020-03-24 ENCOUNTER — Inpatient Hospital Stay: Payer: Commercial Managed Care - PPO | Attending: Hematology | Admitting: Hematology and Oncology

## 2020-04-18 ENCOUNTER — Ambulatory Visit: Payer: Commercial Managed Care - PPO | Admitting: Internal Medicine

## 2020-04-18 DIAGNOSIS — Z0289 Encounter for other administrative examinations: Secondary | ICD-10-CM

## 2021-05-17 ENCOUNTER — Encounter (HOSPITAL_BASED_OUTPATIENT_CLINIC_OR_DEPARTMENT_OTHER): Payer: Self-pay | Admitting: Emergency Medicine

## 2021-05-17 ENCOUNTER — Emergency Department (HOSPITAL_BASED_OUTPATIENT_CLINIC_OR_DEPARTMENT_OTHER)
Admission: EM | Admit: 2021-05-17 | Discharge: 2021-05-17 | Disposition: A | Discharge status: Home / Self Care | Payer: Commercial Managed Care - PPO | Attending: Emergency Medicine | Admitting: Emergency Medicine

## 2021-05-17 ENCOUNTER — Other Ambulatory Visit: Payer: Self-pay

## 2021-05-17 DIAGNOSIS — Z91040 Latex allergy status: Secondary | ICD-10-CM | POA: Insufficient documentation

## 2021-05-17 DIAGNOSIS — I10 Essential (primary) hypertension: Secondary | ICD-10-CM | POA: Insufficient documentation

## 2021-05-17 DIAGNOSIS — N3000 Acute cystitis without hematuria: Secondary | ICD-10-CM | POA: Insufficient documentation

## 2021-05-17 DIAGNOSIS — M545 Low back pain, unspecified: Secondary | ICD-10-CM | POA: Insufficient documentation

## 2021-05-17 LAB — URINALYSIS, ROUTINE W REFLEX MICROSCOPIC
Bilirubin Urine: NEGATIVE
Glucose, UA: NEGATIVE mg/dL
Hgb urine dipstick: NEGATIVE
Ketones, ur: NEGATIVE mg/dL
Leukocytes,Ua: NEGATIVE
Nitrite: POSITIVE — AB
Specific Gravity, Urine: 1.021 (ref 1.005–1.030)
pH: 6 (ref 5.0–8.0)

## 2021-05-17 LAB — PREGNANCY, URINE: Preg Test, Ur: NEGATIVE

## 2021-05-17 MED ORDER — CEPHALEXIN 500 MG PO CAPS: 500.0000 mg | ORAL_CAPSULE | Freq: Four times a day (QID) | ORAL | 0 refills | Status: DC

## 2021-05-17 MED ORDER — PREDNISONE 20 MG PO TABS: 40.0000 mg | ORAL_TABLET | Freq: Every day | ORAL | 0 refills | Status: DC

## 2021-05-17 MED ORDER — KETOROLAC TROMETHAMINE 30 MG/ML IJ SOLN
30.0000 mg | Freq: Once | INTRAMUSCULAR | Status: AC
  Administered 2021-05-17: 30 mg via INTRAMUSCULAR
  Filled 2021-05-17: qty 1

## 2021-05-17 MED ORDER — METHOCARBAMOL 500 MG PO TABS: 500.0000 mg | ORAL_TABLET | Freq: Three times a day (TID) | ORAL | 0 refills | Status: DC | PRN

## 2021-05-17 NOTE — ED Provider Notes (Signed)
[facility] EMERGENCY DEPT Provider Note   CSN: [id number] Arrival date & time: 05/17/21  0740     History Chief Complaint  Patient presents with   Flank Pain    Kimberly [name] is a 30 y.o. female.   Flank Pain Pertinent negatives include no abdominal pain and no shortness of breath. Patient presents with low back pain.  Has had for around a week now.  Initially on both sides of lower back and now more on the right side.  No fevers or chills.  Worse with movement.  Better with just staying in place.  No dysuria.  States she does not urinate very frequently at baseline.  No fevers or chills.  No vaginal bleeding or discharge.  States she has had a ablation and a tubal ligation.  Has not had issues like this before.  No trauma.  Worse with raising her legs up to no cancer history.     Past Medical History:  Diagnosis Date   Anemia    Back pain    Hypertension    Multiple food allergies    Obesity    Ovarian cyst    left side   Pneumonia    Pregnancy induced hypertension    Pregnant     Patient Active Problem List   Diagnosis Date Noted   Galactorrhea 01/12/2020   Hyperprolactinemia (HCC) 01/11/2020   Insulin resistance 01/11/2020   Parapneumonic effusion 07/26/2014   HTN (hypertension) 07/26/2014   Hypokalemia 07/26/2014   Leukocytosis 07/26/2014   Healthcare-associated pneumonia    Maternal morbid obesity, delivered, current hospitalization (HCC) 07/21/2014   PIH (pregnancy induced hypertension) 07/19/2014   Maternal morbid obesity, antepartum (HCC)    Prior pregnancy complicated by IUGR, antepartum    Hx of preeclampsia, prior pregnancy, currently pregnant     Past Surgical History:  Procedure Laterality Date   CESAREAN SECTION  04/09/2011   Procedure: CESAREAN SECTION;  Surgeon: Loney Laurence;  Location: [facility];  Service: Gynecology;  Laterality: N/A;   CESAREAN SECTION N/A 07/21/2014   Procedure: CESAREAN SECTION;  Surgeon: Freddrick March.  Tenny Craw, MD;  Location: [facility];  Service: Obstetrics;  Laterality: N/A;   DILATION AND CURETTAGE OF UTERUS     DILITATION & CURRETTAGE/HYSTROSCOPY WITH NOVASURE ABLATION N/A 03/31/2019   Procedure: DILATATION & CURETTAGE/HYSTEROSCOPY WITH NOVASURE ABLATION;  Surgeon: Carrington Clamp, MD;  Location: [facility];  Service: Gynecology;  Laterality: N/A;   KNEE ARTHROSCOPY     TONSILLECTOMY     TUBAL LIGATION     WISDOM TOOTH EXTRACTION       OB History     Gravida  5   Para  2   Term  1   Preterm  1   AB  3   Living  2      SAB  1   IAB  1   Ectopic  1   Multiple  0   Live Births  2           Family History  Problem Relation Age of Onset   Diabetes Mother    Diabetes Maternal Grandmother    Diabetes Maternal Grandfather    Diabetes Paternal Grandmother    Stroke Paternal Grandmother     Social History   Tobacco Use   Smoking status: Never   Smokeless tobacco: Never  Vaping Use   Vaping Use: Never used  Substance Use Topics   Alcohol use: No   Drug use: No    Home  Medications Prior to Admission medications   Medication Sig Start Date End Date Taking? Authorizing Provider  cephALEXin (KEFLEX) 500 MG capsule Take 1 capsule (500 mg total) by mouth 4 (four) times daily. 05/17/21  Yes Davonna Belling, MD  methocarbamol (ROBAXIN) 500 MG tablet Take 1 tablet (500 mg total) by mouth every 8 (eight) hours as needed for muscle spasms. 05/17/21  Yes Davonna Belling, MD  predniSONE (DELTASONE) 20 MG tablet Take 2 tablets (40 mg total) by mouth daily. 05/17/21  Yes Davonna Belling, MD  cabergoline (DOSTINEX) 0.5 MG tablet Take 0.5 tablets (0.25 mg total) by mouth 2 (two) times a week. 01/13/20   Shamleffer, Melanie Crazier, MD  ferrous sulfate 325 (65 FE) MG tablet Take 325 mg by mouth daily.    [provider]  Vitamin D, Ergocalciferol, (DRISDOL) 1.25 MG (50000 UNIT) CAPS capsule Take 1 capsule (50,000 Units total) by mouth every 7 (seven) days. 12/07/19    Laqueta Linden, MD    Allergies    Lemon juice, Orange juice, Citrus, Penicillins, Sulfa antibiotics, Amoxicillin, and Latex  Review of Systems   Review of Systems  Constitutional:  Negative for appetite change.  HENT:  Negative for congestion.   Respiratory:  Negative for shortness of breath.   Gastrointestinal:  Negative for abdominal pain.  Genitourinary:  Positive for flank pain.  Musculoskeletal:  Positive for back pain.  Skin:  Negative for rash.  Neurological:  Negative for weakness.  Psychiatric/Behavioral:  Negative for confusion.    Physical Exam Updated Vital Signs BP (!) 147/86 (BP Location: Right Arm)   Pulse 78   Temp 98.3 F (36.8 C) (Oral)   Resp 16   Ht 5\' 8"  (1.727 m)   Wt (!) 187.3 kg   SpO2 100%   BMI 62.80 kg/m   Physical Exam Vitals and nursing note reviewed.  Constitutional:      Appearance: She is obese.  HENT:     Head: Normocephalic.  Cardiovascular:     Rate and Rhythm: Normal rate.  Chest:     Chest wall: No tenderness.  Abdominal:     Tenderness: There is no abdominal tenderness.  Musculoskeletal:        General: Tenderness present.     Comments: Some lumbar midline and right paraspinal tenderness.  Pain with straight leg raise on the right and less on the left.  Neurovascular intact in feet.  Neurological:     Mental Status: She is alert.    ED Results / Procedures / Treatments   Labs (all labs ordered are listed, but only abnormal results are displayed) Labs Reviewed  URINALYSIS, ROUTINE W REFLEX MICROSCOPIC - Abnormal; Notable for the following components:      Result Value   APPearance HAZY (*)    Protein, ur TRACE (*)    Nitrite POSITIVE (*)    Bacteria, UA MANY (*)    All other components within normal limits  URINE CULTURE  PREGNANCY, URINE    EKG None  Radiology No results found.  Procedures Procedures   Medications Ordered in ED Medications  ketorolac (TORADOL) 30 MG/ML injection 30 mg (30 mg  Intramuscular Given 05/17/21 0815)    ED Course  I have reviewed the triage vital signs and the nursing notes.  Pertinent labs & imaging results that were available during my care of the patient were reviewed by me and considered in my medical decision making (see chart for details).    MDM Rules/Calculators/A&P  Patient with low back pain.  Worse with movement.  Better with sitting still.  States he urinates less but that is really unchanged.  However with baseline urine abnormalities urinalysis done and showed potentially infection although there were squamous cells.  Will empirically treat, but patient will follow culture.  Her PCP can help if needed.  If culture is negative we will stop antibiotics and will take the steroids.  We will give muscle relaxants.  Stone felt less likely but considered.  If develops fevers will return and may need further imaging.  Will discharge home Final Clinical Impression(s) / ED Diagnoses Final diagnoses:  Acute low back pain without sciatica, unspecified back pain laterality  Acute cystitis without hematuria    Rx / DC Orders ED Discharge Orders          Ordered    methocarbamol (ROBAXIN) 500 MG tablet  Every 8 hours PRN        05/17/21 1112    predniSONE (DELTASONE) 20 MG tablet  Daily        05/17/21 1112    cephALEXin (KEFLEX) 500 MG capsule  4 times daily        05/17/21 1112             Davonna Belling, MD 05/17/21 1115

## 2021-05-17 NOTE — Discharge Instructions (Addendum)
Follow the urine culture on MyChart.  If it shows no growth when the results come back in 1 to 2 days you can stop the antibiotic and fill the steroid.  If you develop fevers or chills or worsening pain that goes up the right flank you may need to return.  Follow-up with your doctor also for the back pain

## 2021-05-17 NOTE — ED Triage Notes (Signed)
Pt complains of right sided flank pain for 7 days. Pt reports decreased urinary frequency.

## 2021-05-18 LAB — URINE CULTURE

## 2021-07-11 ENCOUNTER — Emergency Department (HOSPITAL_BASED_OUTPATIENT_CLINIC_OR_DEPARTMENT_OTHER)
Admission: EM | Admit: 2021-07-11 | Discharge: 2021-07-11 | Disposition: A | Discharge status: Home / Self Care | Payer: Medicaid Other | Attending: Emergency Medicine | Admitting: Emergency Medicine

## 2021-07-11 ENCOUNTER — Encounter (HOSPITAL_BASED_OUTPATIENT_CLINIC_OR_DEPARTMENT_OTHER): Payer: Self-pay | Admitting: Obstetrics and Gynecology

## 2021-07-11 ENCOUNTER — Other Ambulatory Visit: Payer: Self-pay

## 2021-07-11 DIAGNOSIS — M79642 Pain in left hand: Secondary | ICD-10-CM | POA: Insufficient documentation

## 2021-07-11 DIAGNOSIS — R0981 Nasal congestion: Secondary | ICD-10-CM | POA: Insufficient documentation

## 2021-07-11 DIAGNOSIS — M25532 Pain in left wrist: Secondary | ICD-10-CM | POA: Insufficient documentation

## 2021-07-11 DIAGNOSIS — M79641 Pain in right hand: Secondary | ICD-10-CM | POA: Insufficient documentation

## 2021-07-11 DIAGNOSIS — M25531 Pain in right wrist: Secondary | ICD-10-CM | POA: Insufficient documentation

## 2021-07-11 DIAGNOSIS — Z20822 Contact with and (suspected) exposure to covid-19: Secondary | ICD-10-CM | POA: Insufficient documentation

## 2021-07-11 LAB — RESP PANEL BY RT-PCR (FLU A&B, COVID) ARPGX2
Influenza A by PCR: NEGATIVE
Influenza B by PCR: NEGATIVE
SARS Coronavirus 2 by RT PCR: NEGATIVE

## 2021-07-11 NOTE — ED Provider Notes (Signed)
[facility] EMERGENCY DEPT Provider Note   CSN: [id number] Arrival date & time: 07/11/21  1946     History  Chief Complaint  Patient presents with   Nasal Congestion   Generalized Body Aches    Kimberly [name] is a 31 y.o. female presenting with a complaint of bilateral wrist and hand pain for 2 days and 1 day worth of nasal congestion.  Denies any injuries or falls onto the wrist.  Took an at home COVID test that was negative.  Denies any fevers, chills, nausea, vomiting or diarrhea.  No known sick contacts.  Reports that she has been taking ibuprofen, Tylenol and icing her wrists without relief.  No numbness or tingling   Home Medications Prior to Admission medications   Medication Sig Start Date End Date Taking? Authorizing Provider  cabergoline (DOSTINEX) 0.5 MG tablet Take 0.5 tablets (0.25 mg total) by mouth 2 (two) times a week. 01/13/20   Shamleffer, Konrad Dolores, MD  cephALEXin (KEFLEX) 500 MG capsule Take 1 capsule (500 mg total) by mouth 4 (four) times daily. 05/17/21   Benjiman Core, MD  ferrous sulfate 325 (65 FE) MG tablet Take 325 mg by mouth daily.    [provider]  methocarbamol (ROBAXIN) 500 MG tablet Take 1 tablet (500 mg total) by mouth every 8 (eight) hours as needed for muscle spasms. 05/17/21   Benjiman Core, MD  predniSONE (DELTASONE) 20 MG tablet Take 2 tablets (40 mg total) by mouth daily. 05/17/21   Benjiman Core, MD  Vitamin D, Ergocalciferol, (DRISDOL) 1.25 MG (50000 UNIT) CAPS capsule Take 1 capsule (50,000 Units total) by mouth every 7 (seven) days. 12/07/19   Langston Reusing, MD      Allergies    Lemon juice, Orange juice, Citrus, Penicillins, Sulfa antibiotics, Amoxicillin, and Latex    Review of Systems   Review of Systems As per HPI Physical Exam Updated Vital Signs BP (!) 145/94    Pulse 95    Temp 99 F (37.2 C)    Resp 18    Ht 5\' 8"  (1.727 m)    Wt (!) 188.2 kg    SpO2 98%    Breastfeeding No    BMI  63.10 kg/m  Physical Exam Vitals and nursing note reviewed.  Constitutional:      General: She is not in acute distress.    Appearance: Normal appearance. She is not ill-appearing.  HENT:     Head: Normocephalic and atraumatic.     Nose: Nose normal.     Mouth/Throat:     Mouth: Mucous membranes are moist.     Pharynx: Oropharynx is clear.  Eyes:     General: No scleral icterus.    Conjunctiva/sclera: Conjunctivae normal.  Pulmonary:     Effort: Pulmonary effort is normal. No respiratory distress.  Musculoskeletal:     Comments: Full range of bilateral wrists and MCPs.  No inflammation or bony tenderness.  Endorses some pain with radial deviation bilaterally  Skin:    General: Skin is warm and dry.     Findings: No rash.  Neurological:     Mental Status: She is alert.  Psychiatric:        Mood and Affect: Mood normal.    ED Results / Procedures / Treatments   Labs (all labs ordered are listed, but only abnormal results are displayed) Labs Reviewed  RESP PANEL BY RT-PCR (FLU A&B, COVID) ARPGX2    EKG None  Radiology No results found.  Procedures Procedures   Medications Ordered in ED Medications - No data to display  ED Course/ Medical Decision Making/ A&P                           Medical Decision Making  31 year old female presenting with a complaint of bilateral wrist pain and nasal congestion.  She has not been seen by her primary care provider.  Denied any injury or other URI symptoms.  COVID and flu was negative.  There is no known injury or tenderness over the radius/ulna.  She has full range of motion of the bilateral wrist.  I do not believe imaging is indicated at this time.  There is no inflammation or signs of effusion, low likelihood septic joints.  No risk factors.  I believe it is best for her to follow-up with her primary care provider about the symptoms and to assure resolution since they just began within the last 1 to 2 days.  Return  precautions discussed.  She has also been given 2 wrist braces and a referral to sports medicine as requested.  Final Clinical Impression(s) / ED Diagnoses Final diagnoses:  Nasal congestion    Rx / DC Orders Results and diagnoses were explained to the patient. Return precautions discussed in full. Patient had no additional questions and expressed complete understanding.   This chart was dictated using voice recognition software.  Despite best efforts to proofread,  errors can occur which can change the documentation meaning.      Woodroe Chen 07/11/21 2143    Benjiman Core, MD 07/12/21 1240

## 2021-07-11 NOTE — ED Triage Notes (Signed)
Patient reports to the ER for congestion and bilateral arm and wrist pain. Patient reports she has not been in any accident or been in a fight. Patient reports is started x2-3 days ago.

## 2021-07-11 NOTE — Discharge Instructions (Addendum)
Your COVID and flu tests are both negative today.  It is important for you to follow-up with your primary care provider about the symptoms.  They are a good starting place for treatment of your nasal congestion and discomfort in your wrist.  You may also use over-the-counter medications such as Tylenol, Advil and Mucinex.  A referral to sports medicine is also attached to these papers if you would like to follow-up about this problem.

## 2021-10-30 ENCOUNTER — Encounter (HOSPITAL_BASED_OUTPATIENT_CLINIC_OR_DEPARTMENT_OTHER): Payer: Self-pay | Admitting: Obstetrics and Gynecology

## 2021-10-30 ENCOUNTER — Emergency Department (HOSPITAL_BASED_OUTPATIENT_CLINIC_OR_DEPARTMENT_OTHER)
Admission: EM | Admit: 2021-10-30 | Discharge: 2021-10-30 | Disposition: A | Discharge status: Home / Self Care | Payer: BC Managed Care – PPO | Attending: Emergency Medicine | Admitting: Emergency Medicine

## 2021-10-30 ENCOUNTER — Other Ambulatory Visit: Payer: Self-pay

## 2021-10-30 DIAGNOSIS — B349 Viral infection, unspecified: Secondary | ICD-10-CM | POA: Diagnosis not present

## 2021-10-30 DIAGNOSIS — Z91040 Latex allergy status: Secondary | ICD-10-CM | POA: Diagnosis not present

## 2021-10-30 DIAGNOSIS — R509 Fever, unspecified: Secondary | ICD-10-CM | POA: Diagnosis present

## 2021-10-30 DIAGNOSIS — J069 Acute upper respiratory infection, unspecified: Secondary | ICD-10-CM | POA: Insufficient documentation

## 2021-10-30 LAB — GROUP A STREP BY PCR: Group A Strep by PCR: NOT DETECTED

## 2021-10-30 MED ORDER — ACETAMINOPHEN 500 MG PO TABS
1000.0000 mg | ORAL_TABLET | Freq: Once | ORAL | Status: AC
Start: 2021-10-30 — End: 2021-10-30
  Administered 2021-10-30: 1000 mg via ORAL
  Filled 2021-10-30: qty 2

## 2021-10-30 MED ORDER — IBUPROFEN 800 MG PO TABS
800.0000 mg | ORAL_TABLET | Freq: Once | ORAL | Status: AC
Start: 2021-10-30 — End: 2021-10-30
  Administered 2021-10-30: 800 mg via ORAL
  Filled 2021-10-30: qty 1

## 2021-10-30 NOTE — Discharge Instructions (Addendum)
Please use acetaminophen and ibuprofen to control your fever and body aches ?Your strep test here is negative ?Your work-up is most consistent with a viral infection which normally heals on its own there are no specific medications to be given ?However, if symptoms change especially become different such as abdominal pain, rashes, or other new symptoms, you should be reevaluated. ?Recheck with your doctor if not improved in 2 to 3 days. ?

## 2021-10-30 NOTE — ED Provider Notes (Signed)
?[facility] ?Provider Note ? ? ?CSN: [id number] ?Arrival date & time: 10/30/21  0945 ? ?  ? ?History ? ?Chief Complaint  ?Patient presents with  ? Fever  ? ? ?Kimberly [name] is a 31 y.o. female. ? ?HPI ?31 yo female presents with fever, sore throat, body aches, symptoms began loc.  Patient was at event yesterday.  She had negative covid test en route. ?  ? ?Home Medications ?Prior to Admission medications   ?Medication Sig Start Date End Date Taking? Authorizing Provider  ?cabergoline (DOSTINEX) 0.5 MG tablet Take 0.5 tablets (0.25 mg total) by mouth 2 (two) times a week. 01/13/20   Shamleffer, Melanie Crazier, MD  ?cephALEXin (KEFLEX) 500 MG capsule Take 1 capsule (500 mg total) by mouth 4 (four) times daily. 05/17/21   Davonna Belling, MD  ?ferrous sulfate 325 (65 FE) MG tablet Take 325 mg by mouth daily.    [provider]  ?methocarbamol (ROBAXIN) 500 MG tablet Take 1 tablet (500 mg total) by mouth every 8 (eight) hours as needed for muscle spasms. 05/17/21   Davonna Belling, MD  ?predniSONE (DELTASONE) 20 MG tablet Take 2 tablets (40 mg total) by mouth daily. 05/17/21   Davonna Belling, MD  ?Vitamin D, Ergocalciferol, (DRISDOL) 1.25 MG (50000 UNIT) CAPS capsule Take 1 capsule (50,000 Units total) by mouth every 7 (seven) days. 12/07/19   Laqueta Linden, MD  ?   ? ?Allergies    ?Lemon juice, Orange juice, Citrus, Penicillins, Sulfa antibiotics, Amoxicillin, and Latex   ? ?Review of Systems   ?Review of Systems ? ?Physical Exam ?Updated Vital Signs ?BP (!) 157/84   Pulse 84   Temp (!) 102.4 ?F (39.1 ?C) (Oral)   Resp 16   Ht 1.727 m (5\' 8" )   Wt (!) 181.4 kg   SpO2 97%   BMI 60.82 kg/m?  ?Physical Exam ?Vitals and nursing note reviewed.  ?Constitutional:   ?   Appearance: Normal appearance.  ?HENT:  ?   Head: Normocephalic.  ?   Right Ear: Tympanic membrane normal.  ?   Left Ear: Tympanic membrane normal.  ?   Nose: Nose normal.  ?   Mouth/Throat:  ?   Mouth:  Mucous membranes are moist.  ?   Pharynx: Posterior oropharyngeal erythema present. No oropharyngeal exudate.  ?Eyes:  ?   Extraocular Movements: Extraocular movements intact.  ?   Pupils: Pupils are equal, round, and reactive to light.  ?Cardiovascular:  ?   Rate and Rhythm: Normal rate and regular rhythm.  ?   Pulses: Normal pulses.  ?Pulmonary:  ?   Effort: Pulmonary effort is normal.  ?Abdominal:  ?   General: Bowel sounds are normal.  ?   Palpations: Abdomen is soft.  ?Musculoskeletal:     ?   General: Normal range of motion.  ?   Cervical back: Normal range of motion.  ?Skin: ?   General: Skin is warm.  ?   Capillary Refill: Capillary refill takes less than 2 seconds.  ?Neurological:  ?   Mental Status: She is alert.  ?Psychiatric:     ?   Mood and Affect: Mood normal.  ? ? ?ED Results / Procedures / Treatments   ?Labs ?(all labs ordered are listed, but only abnormal results are displayed) ?Labs Reviewed  ?GROUP A STREP BY PCR  ? ? ?EKG ?None ? ?Radiology ?No results found. ? ?Procedures ?Procedures  ? ? ?Medications Ordered in ED ?Medications  ?acetaminophen (TYLENOL)  tablet 1,000 mg (1,000 mg Oral Given 10/30/21 1015)  ?ibuprofen (ADVIL) tablet 800 mg (800 mg Oral Given 10/30/21 1015)  ? ? ?ED Course/ Medical Decision Making/ A&P ?Clinical Course as of 10/30/21 1053  ?Tue Oct 30, 2021  ?1051 Strep reviewed interpreted and negative [DR]  ?  ?Clinical Course User Index ?[DR] Pattricia Boss, MD  ? ?                        ?Medical Decision Making ?31 year old female presents today complaining of fever, sore throat, and body aches.  Patient self tested for COVID before presentation and does not wish to be retested.  This seems reasonable given that she has already tested.  There is a low incidence of fluid this time and I think it is reasonable not to test for flu.  Strep was obtained and is negative.  Patient is hemodynamically stable.  She is able to take p.o.  She is given acetaminophen and ibuprofen here in the  ED. ?Low index of suspicion for lower respiratory infection as patient is not dyspneic and does not have productive cough and lungs are clear on auscultation. ?Low index of suspicion of other sources as patient is not complaining of any other symptoms including abdominal pain, vomiting, diarrhea, pain in extremities, rashes, UTI symptoms, or vaginal discharge. ?Strep is negative and patient appears stable for discharge.  We have discussed oral hydration, antipyretics, return precautions and need for follow-up and she voiced understanding. ? ?Amount and/or Complexity of Data Reviewed ?Labs: ordered. Decision-making details documented in ED Course. ? ?Risk ?OTC drugs. ?Prescription drug management. ? ? ? ? ? ? ? ? ? ? ?Final Clinical Impression(s) / ED Diagnoses ?Final diagnoses:  ?Upper respiratory tract infection, unspecified type  ?Viral infection  ? ? ?Rx / DC Orders ?ED Discharge Orders   ? ? None  ? ?  ? ? ?  ?Pattricia Boss, MD ?10/30/21 1053 ? ?

## 2021-10-30 NOTE — ED Triage Notes (Signed)
Patient reports to the ER for fever and congestion. Patient endorses ear pain and sore throat. Patient reports she took allergy medication and benadryl.  ?

## 2021-10-31 ENCOUNTER — Encounter (HOSPITAL_BASED_OUTPATIENT_CLINIC_OR_DEPARTMENT_OTHER): Payer: Self-pay

## 2021-10-31 ENCOUNTER — Emergency Department (HOSPITAL_BASED_OUTPATIENT_CLINIC_OR_DEPARTMENT_OTHER): Payer: BC Managed Care – PPO | Admitting: Radiology

## 2021-10-31 ENCOUNTER — Emergency Department (HOSPITAL_BASED_OUTPATIENT_CLINIC_OR_DEPARTMENT_OTHER)
Admission: EM | Admit: 2021-10-31 | Discharge: 2021-10-31 | Disposition: A | Discharge status: Home / Self Care | Payer: BC Managed Care – PPO | Attending: Emergency Medicine | Admitting: Emergency Medicine

## 2021-10-31 ENCOUNTER — Other Ambulatory Visit: Payer: Self-pay

## 2021-10-31 DIAGNOSIS — J069 Acute upper respiratory infection, unspecified: Secondary | ICD-10-CM | POA: Insufficient documentation

## 2021-10-31 DIAGNOSIS — Z91040 Latex allergy status: Secondary | ICD-10-CM | POA: Diagnosis not present

## 2021-10-31 DIAGNOSIS — R0789 Other chest pain: Secondary | ICD-10-CM | POA: Diagnosis not present

## 2021-10-31 DIAGNOSIS — Z20822 Contact with and (suspected) exposure to covid-19: Secondary | ICD-10-CM | POA: Insufficient documentation

## 2021-10-31 DIAGNOSIS — E870 Hyperosmolality and hypernatremia: Secondary | ICD-10-CM | POA: Insufficient documentation

## 2021-10-31 DIAGNOSIS — N3001 Acute cystitis with hematuria: Secondary | ICD-10-CM | POA: Diagnosis not present

## 2021-10-31 DIAGNOSIS — M542 Cervicalgia: Secondary | ICD-10-CM | POA: Diagnosis not present

## 2021-10-31 DIAGNOSIS — D72829 Elevated white blood cell count, unspecified: Secondary | ICD-10-CM | POA: Insufficient documentation

## 2021-10-31 DIAGNOSIS — R059 Cough, unspecified: Secondary | ICD-10-CM | POA: Diagnosis present

## 2021-10-31 LAB — URINALYSIS, ROUTINE W REFLEX MICROSCOPIC
Bilirubin Urine: NEGATIVE
Glucose, UA: NEGATIVE mg/dL
Hgb urine dipstick: NEGATIVE
Ketones, ur: NEGATIVE mg/dL
Leukocytes,Ua: NEGATIVE
Nitrite: POSITIVE — AB
Protein, ur: NEGATIVE mg/dL
Specific Gravity, Urine: 1.012 (ref 1.005–1.030)
pH: 6 (ref 5.0–8.0)

## 2021-10-31 LAB — RESP PANEL BY RT-PCR (FLU A&B, COVID) ARPGX2
Influenza A by PCR: NEGATIVE
Influenza B by PCR: NEGATIVE
SARS Coronavirus 2 by RT PCR: NEGATIVE

## 2021-10-31 MED ORDER — ACETAMINOPHEN 500 MG PO TABS
1000.0000 mg | ORAL_TABLET | Freq: Once | ORAL | Status: AC
Start: 2021-10-31 — End: 2021-10-31
  Administered 2021-10-31: 1000 mg via ORAL
  Filled 2021-10-31: qty 2

## 2021-10-31 MED ORDER — CEPHALEXIN 500 MG PO CAPS: 500.0000 mg | ORAL_CAPSULE | Freq: Two times a day (BID) | ORAL | 0 refills | Status: AC

## 2021-10-31 NOTE — ED Triage Notes (Signed)
She c/o cough/cold sx x 3 days with some diffuse chest discomfort. She I sambulatory and in no distress. ?

## 2021-10-31 NOTE — Discharge Instructions (Signed)
Your urinalysis today shows signs of infection.  I have prescribed antibiotics.  To help treat your urinary tract infection, please take 1 tablet twice a day for the next 7 days.  In addition, you may need to continue taking Tylenol, Motrin to help with your fever. ? ?If you experience any worsening symptoms, back pain, fever that you are unable to control you will need to return to the emergency department. ?

## 2021-10-31 NOTE — ED Provider Notes (Addendum)
[facility] EMERGENCY DEPT Provider Note   CSN: [id number] Arrival date & time: 10/31/21  1556     History  Chief Complaint  Patient presents with   URI with aches and chest pain    Kimberly [name] is a 31 y.o. female.  31 y.o female with no PMH presents to the ED with a chief complaint of cough, cold for the past 2 days.  Patient reports symptoms began on Monday, have continued to worsen.  She was evaluated in the ED yesterday, did have a negative COVID at home therefore did not want to repeat testing.  She reports some pain along her left neck, worsened with swallowing.  She also endorses some nasal congestion, chest discomfort, has taken 1 tablet of Tylenol with no improvement in her symptoms.  Ports she is also having some increase in urination but does report taking in more fluids.  She does have a fever at home with a Tmax of 101.3, arrived in the ED with the same fever.  No shortness of breath, no recent travel, no abdominal pain.   The history is provided by the patient and medical records.      Home Medications Prior to Admission medications   Medication Sig Start Date End Date Taking? Authorizing Provider  cephALEXin (KEFLEX) 500 MG capsule Take 1 capsule (500 mg total) by mouth 2 (two) times daily for 7 days. 10/31/21 11/07/21 Yes Norrin Shreffler, Leonie Douglas, PA-C  cabergoline (DOSTINEX) 0.5 MG tablet Take 0.5 tablets (0.25 mg total) by mouth 2 (two) times a week. 01/13/20   Shamleffer, Konrad Dolores, MD  ferrous sulfate 325 (65 FE) MG tablet Take 325 mg by mouth daily.    [provider]  methocarbamol (ROBAXIN) 500 MG tablet Take 1 tablet (500 mg total) by mouth every 8 (eight) hours as needed for muscle spasms. 05/17/21   Benjiman Core, MD  predniSONE (DELTASONE) 20 MG tablet Take 2 tablets (40 mg total) by mouth daily. 05/17/21   Benjiman Core, MD  Vitamin D, Ergocalciferol, (DRISDOL) 1.25 MG (50000 UNIT) CAPS capsule Take 1 capsule (50,000 Units total) by  mouth every 7 (seven) days. 12/07/19   Langston Reusing, MD      Allergies    Lemon juice, Orange juice, Citrus, Penicillins, Sulfa antibiotics, Amoxicillin, and Latex    Review of Systems   Review of Systems  Constitutional:  Positive for chills and fever.  HENT:  Positive for sore throat.   Respiratory:  Positive for chest tightness. Negative for shortness of breath.   Cardiovascular:  Negative for chest pain, palpitations and leg swelling.  Gastrointestinal:  Negative for abdominal pain, nausea and vomiting.  Genitourinary:  Positive for frequency.  Musculoskeletal:  Negative for back pain.  Neurological:  Negative for light-headedness and headaches.  All other systems reviewed and are negative.  Physical Exam Updated Vital Signs BP (!) 177/89   Pulse (!) 108   Temp 99.4 F (37.4 C) (Oral)   Resp 18   SpO2 100%  Physical Exam Vitals and nursing note reviewed.  Constitutional:      Appearance: She is obese. She is ill-appearing.  HENT:     Head: Normocephalic and atraumatic.     Nose: Congestion present.     Mouth/Throat:     Mouth: Mucous membranes are moist.     Pharynx: Posterior oropharyngeal erythema present. No oropharyngeal exudate.  Eyes:     Pupils: Pupils are equal, round, and reactive to light.  Cardiovascular:  Rate and Rhythm: Normal rate.     Comments: HR 90 while in the room.  Pulmonary:     Effort: Pulmonary effort is normal.     Breath sounds: No wheezing or rales.  Abdominal:     Palpations: Abdomen is soft.     Tenderness: There is no abdominal tenderness.  Musculoskeletal:     Cervical back: Normal range of motion and neck supple.  Skin:    General: Skin is warm and dry.  Neurological:     Mental Status: She is alert and oriented to person, place, and time.    ED Results / Procedures / Treatments   Labs (all labs ordered are listed, but only abnormal results are displayed) Labs Reviewed  URINALYSIS, ROUTINE W REFLEX MICROSCOPIC -  Abnormal; Notable for the following components:      Result Value   APPearance HAZY (*)    Nitrite POSITIVE (*)    Bacteria, UA FEW (*)    All other components within normal limits  RESP PANEL BY RT-PCR (FLU A&B, COVID) [id number]  URINE CULTURE    EKG None  Radiology DG Chest 2 View  Result Date: 10/31/2021 CLINICAL DATA:  Chest pain, fever EXAM: CHEST - 2 VIEW COMPARISON:  06/12/2018 FINDINGS: The heart size and mediastinal contours are within normal limits. Both lungs are clear. The visualized skeletal structures are unremarkable. IMPRESSION: No active cardiopulmonary disease. Electronically Signed   By: Charlett Nose M.D.   On: 10/31/2021 16:30    Procedures Procedures    Medications Ordered in ED Medications  acetaminophen (TYLENOL) tablet 1,000 mg (1,000 mg Oral Given 10/31/21 1756)    ED Course/ Medical Decision Making/ A&P Clinical Course as of 10/31/21 1901  Wed Oct 31, 2021  1724 DG Chest 2 View [JS]  1849 Nitrite(!): POSITIVE [JS]  1849 Bacteria, UA(!): FEW [JS]  1849 WBC, UA: 11-20 [JS]    Clinical Course User Index [JS] Claude Manges, PA-C                           Medical Decision Making Patient here with upper respiratory symptoms, increase in urination.  Respiratory panel is negative, exam is unremarkable, does have some nasal congestion, cervical adenopathy consistent with likely an upper respiratory infection.  She also voices some urinary urgency and frequency at this time.  UA was analyzed with positive nitrites, few bacteria, 11-20 white blood cell count, also febrile here did consider pyelonephritis however patient has had 2 days of symptoms.  We discussed placing her on Keflex for symptomatic control.  No pain along the CVA to suggest renal colic.  No prior history of kidney disease according to patient.  She has tolerated Keflex in the past without any reaction therefore we will be providing her with a short prescription of this. X-ray without any signs of  pneumonia or acute process.  Amount and/or Complexity of Data Reviewed Labs: ordered. Decision-making details documented in ED Course. Radiology: ordered. Decision-making details documented in ED Course.  Risk OTC drugs. Prescription drug management.   Patient here with URI symptoms x 3 days, has not taking any medication for improvement in symptoms aside from 1 tablet of tylenol. Arrived in the ED tachycardic and febrile with a oral temp of 101.3, given 650 of tylenol.  On exam she is non toxic appearing, is ill-appearing, reports she is barely able to stay awake due to feeling so achy.  Oropharynx is clear with some erythema  noted but no tonsillar exudate noted.  There is some cervical adenopathy palpated on the left side.  Congestion also noted.  Her lungs are clear to auscultation, but difficult to auscultate due to body habitus.  X-ray did not show any pneumonia on today's visit.  We did discuss repeating COVID swab as this was negative at home.  Suspicion for viral etiology.  She also reports increase in fluids, is having more frequency with urination, will also test her urine at this time.  Rapid respiratory panel showed no COVID-19, influenza A, influenza B.  Urinalysis with positive nitrates, few bacteria 11-20 white blood cell count, in the setting of urinary urgency we will treat patient for UTI.  No CVA tenderness bilaterally, I did consider pyelonephritis, however patient with reassuring exam and 2 days of symptoms.  We will treat with Keflex twice daily for the next 7 days.  She has tolerated this medication according to her records in 2022 after chart review.   I discussed with patient continue to increase her fluids, follow-up with PCP in 1 week after completion of antibiotics. BP (!) 177/89   Pulse (!) 108   Temp 99.4 F (37.4 C) (Oral)   Resp 18   SpO2 100%     Portions of this note were generated with Scientist, clinical (histocompatibility and immunogenetics)Dragon dictation software. Dictation errors may occur despite best  attempts at proofreading. \  Final Clinical Impression(s) / ED Diagnoses Final diagnoses:  Viral URI with cough  Acute cystitis with hematuria    Rx / DC Orders ED Discharge Orders          Ordered    cephALEXin (KEFLEX) 500 MG capsule  2 times daily        10/31/21 1859              Claude MangesSoto, Avett Reineck, PA-C 10/31/21 1901    Claude MangesSoto, Hamlin Devine, PA-C 10/31/21 1902    Claude MangesSoto, Wallice Granville, PA-C 10/31/21 1903    Cheryll CockayneHong, Joshua S, MD 11/02/21 2312

## 2021-11-01 LAB — URINE CULTURE: Culture: <10000 — AB

## 2021-12-20 ENCOUNTER — Emergency Department (HOSPITAL_BASED_OUTPATIENT_CLINIC_OR_DEPARTMENT_OTHER): Payer: BC Managed Care – PPO | Admitting: Radiology

## 2021-12-20 ENCOUNTER — Encounter (HOSPITAL_BASED_OUTPATIENT_CLINIC_OR_DEPARTMENT_OTHER): Payer: Self-pay

## 2021-12-20 ENCOUNTER — Other Ambulatory Visit: Payer: Self-pay

## 2021-12-20 ENCOUNTER — Emergency Department (HOSPITAL_BASED_OUTPATIENT_CLINIC_OR_DEPARTMENT_OTHER)
Admission: EM | Admit: 2021-12-20 | Discharge: 2021-12-20 | Disposition: A | Discharge status: Home / Self Care | Payer: BC Managed Care – PPO | Attending: Emergency Medicine | Admitting: Emergency Medicine

## 2021-12-20 ENCOUNTER — Other Ambulatory Visit (HOSPITAL_BASED_OUTPATIENT_CLINIC_OR_DEPARTMENT_OTHER): Payer: Self-pay

## 2021-12-20 DIAGNOSIS — Z91040 Latex allergy status: Secondary | ICD-10-CM | POA: Diagnosis not present

## 2021-12-20 DIAGNOSIS — M25551 Pain in right hip: Secondary | ICD-10-CM | POA: Insufficient documentation

## 2021-12-20 MED ORDER — PREDNISONE 10 MG PO TABS
20.0000 mg | ORAL_TABLET | Freq: Every day | ORAL | 0 refills | Status: DC
  Filled 2021-12-20: qty 10, 5d supply, fill #0

## 2021-12-20 MED ORDER — OXYCODONE-ACETAMINOPHEN 5-325 MG PO TABS
2.0000 | ORAL_TABLET | Freq: Once | ORAL | Status: AC
  Administered 2021-12-20: 2 via ORAL
  Filled 2021-12-20: qty 2

## 2021-12-20 NOTE — ED Provider Notes (Signed)
[facility] EMERGENCY DEPT Provider Note   CSN: [id number] Arrival date & time: 12/20/21  4270     History  Chief Complaint  Patient presents with  . Hip Pain    Kimberly [name] is a 31 y.o. female.  HPI 31 year old female morbidly obese presents today complaining of right hip pain.  It is been present for 2-1/2 days.  She has had no definite injury.  No redness, swelling, or fever.  Pain is worse with internal rotation, palpation, and walking.  Patient does not have other complaints.     Home Medications Prior to Admission medications   Medication Sig Start Date End Date Taking? Authorizing Provider  predniSONE (DELTASONE) 10 MG tablet Take 2 tablets (20 mg total) by mouth daily. 12/20/21  Yes Margarita Grizzle, MD  cabergoline (DOSTINEX) 0.5 MG tablet Take 0.5 tablets (0.25 mg total) by mouth 2 (two) times a week. 01/13/20   Shamleffer, Konrad Dolores, MD  ferrous sulfate 325 (65 FE) MG tablet Take 325 mg by mouth daily.    [provider]  methocarbamol (ROBAXIN) 500 MG tablet Take 1 tablet (500 mg total) by mouth every 8 (eight) hours as needed for muscle spasms. 05/17/21   Benjiman Core, MD  Vitamin D, Ergocalciferol, (DRISDOL) 1.25 MG (50000 UNIT) CAPS capsule Take 1 capsule (50,000 Units total) by mouth every 7 (seven) days. 12/07/19   Langston Reusing, MD      Allergies    Lemon juice, Orange juice, Citrus, Penicillins, Sulfa antibiotics, Amoxicillin, and Latex    Review of Systems   Review of Systems  Physical Exam Updated Vital Signs BP (!) 147/99 (BP Location: Right Wrist)   Pulse 74   Temp 97.9 F (36.6 C)   Resp (!) 22   SpO2 98%  Physical Exam Vitals and nursing note reviewed.  Constitutional:      General: She is not in acute distress.    Appearance: Normal appearance. She is well-developed. She is obese.  HENT:     Head: Normocephalic and atraumatic.     Right Ear: External ear normal.     Left Ear: External ear normal.      Nose: Nose normal.     Mouth/Throat:     Pharynx: Oropharynx is clear.  Eyes:     Conjunctiva/sclera: Conjunctivae normal.     Pupils: Pupils are equal, round, and reactive to light.  Pulmonary:     Effort: Pulmonary effort is normal.  Musculoskeletal:        General: Normal range of motion.     Cervical back: Normal range of motion and neck supple.     Comments: Right hip with tenderness to palpation over right hip and some in the groin. No warmth or redness noted Full active range of motion but increased pain with flexion and external rotation No knee injury or trauma noted Pulses intact  Skin:    General: Skin is warm and dry.  Neurological:     Mental Status: She is alert and oriented to person, place, and time.     Motor: No abnormal muscle tone.     Coordination: Coordination normal.  Psychiatric:        Behavior: Behavior normal.        Thought Content: Thought content normal.     ED Results / Procedures / Treatments   Labs (all labs ordered are listed, but only abnormal results are displayed) Labs Reviewed - No data to display  EKG None  Radiology DG  Hip Unilat  With Pelvis 2-3 Views Right  Result Date: 12/20/2021 CLINICAL DATA:  Right hip pain EXAM: DG HIP (WITH OR WITHOUT PELVIS) 2-3V RIGHT COMPARISON:  None Available. FINDINGS: There is no evidence of hip fracture or dislocation. There is no evidence of arthropathy or other focal bone abnormality. IMPRESSION: No acute osseous abnormality identified. Electronically Signed   By: Jannifer Hick M.D.   On: 12/20/2021 09:15    Procedures Procedures    Medications Ordered in ED Medications  oxyCODONE-acetaminophen (PERCOCET/ROXICET) 5-325 MG per tablet 2 tablet (has no administration in time range)    ED Course/ Medical Decision Making/ A&P Clinical Course as of 12/20/21 0926  Thu Dec 20, 2021  0925 X-Kimberly [name] reviewed interpreted no evidence of acute abnormality noted and radiologist interpretation concurs [DR]     Clinical Course User Index [DR] Margarita Grizzle, MD                           Medical Decision Making 30 year old female with right hip pain Differential diagnosis includes but is not limited to fracture, bursitis, infection including joint infection and cellulitis, Plain films obtained and no evidence of fracture and no recent trauma noted No redness or signs of infection or cellulitis noted Symptoms most consistent with bursitis and will treat with steroids and pain medication.  Patient advised of return precautions need for follow-up and voiced understanding.  Amount and/or Complexity of Data Reviewed Radiology: ordered.  Risk Prescription drug management.           Final Clinical Impression(s) / ED Diagnoses Final diagnoses:  Right hip pain    Rx / DC Orders ED Discharge Orders          Ordered    predniSONE (DELTASONE) 10 MG tablet  Daily        12/20/21 0923              Margarita Grizzle, MD 12/20/21 [phone]

## 2021-12-20 NOTE — Discharge Instructions (Signed)
Your symptoms are consistent with bursitis.  Please take the prednisone as prescribed You may then use acetaminophen.  Once you have finished the prednisone you can use of ibuprofen. Please recheck with your primary care doctor for follow-up of blood pressure in an outpatient setting.  Return if you are having increased pain, redness, fever or worsening symptoms

## 2021-12-20 NOTE — ED Triage Notes (Signed)
Pt presents with Right hip pain x3 days. Denies any injury, ambulatory to triage, taking tylenol with no relief

## 2022-01-21 ENCOUNTER — Other Ambulatory Visit: Payer: Self-pay

## 2022-01-21 ENCOUNTER — Emergency Department (HOSPITAL_BASED_OUTPATIENT_CLINIC_OR_DEPARTMENT_OTHER)
Admission: EM | Admit: 2022-01-21 | Discharge: 2022-01-22 | Disposition: A | Discharge status: Home / Self Care | Payer: BC Managed Care – PPO | Attending: Emergency Medicine | Admitting: Emergency Medicine

## 2022-01-21 ENCOUNTER — Emergency Department (HOSPITAL_BASED_OUTPATIENT_CLINIC_OR_DEPARTMENT_OTHER): Payer: BC Managed Care – PPO

## 2022-01-21 ENCOUNTER — Encounter (HOSPITAL_BASED_OUTPATIENT_CLINIC_OR_DEPARTMENT_OTHER): Payer: Self-pay | Admitting: Emergency Medicine

## 2022-01-21 DIAGNOSIS — Z91040 Latex allergy status: Secondary | ICD-10-CM | POA: Diagnosis not present

## 2022-01-21 DIAGNOSIS — I10 Essential (primary) hypertension: Secondary | ICD-10-CM | POA: Insufficient documentation

## 2022-01-21 DIAGNOSIS — M7989 Other specified soft tissue disorders: Secondary | ICD-10-CM | POA: Diagnosis present

## 2022-01-21 DIAGNOSIS — R609 Edema, unspecified: Secondary | ICD-10-CM

## 2022-01-21 DIAGNOSIS — R600 Localized edema: Secondary | ICD-10-CM | POA: Insufficient documentation

## 2022-01-21 LAB — CBC WITH DIFFERENTIAL/PLATELET
Abs Immature Granulocytes: 0.02 10*3/uL (ref 0.00–0.07)
Basophils Absolute: 0.1 10*3/uL (ref 0.0–0.1)
Basophils Relative: 1 %
Eosinophils Absolute: 0.2 10*3/uL (ref 0.0–0.5)
Eosinophils Relative: 2 %
HCT: 34 % — ABNORMAL LOW (ref 36.0–46.0)
Hemoglobin: 10.6 g/dL — ABNORMAL LOW (ref 12.0–15.0)
Immature Granulocytes: 0 %
Lymphocytes Relative: 39 %
Lymphs Abs: 3.6 10*3/uL (ref 0.7–4.0)
MCH: 22.3 pg — ABNORMAL LOW (ref 26.0–34.0)
MCHC: 31.2 g/dL (ref 30.0–36.0)
MCV: 71.4 fL — ABNORMAL LOW (ref 80.0–100.0)
Monocytes Absolute: 0.6 10*3/uL (ref 0.1–1.0)
Monocytes Relative: 7 %
Neutro Abs: 4.9 10*3/uL (ref 1.7–7.7)
Neutrophils Relative %: 51 %
Platelets: 346 10*3/uL (ref 150–400)
RBC: 4.76 MIL/uL (ref 3.87–5.11)
RDW: 15.2 % (ref 11.5–15.5)
WBC: 9.4 10*3/uL (ref 4.0–10.5)
nRBC: 0 % (ref 0.0–0.2)

## 2022-01-21 LAB — COMPREHENSIVE METABOLIC PANEL
ALT: 13 U/L (ref 0–44)
AST: 18 U/L (ref 15–41)
Albumin: 3.6 g/dL (ref 3.5–5.0)
Alkaline Phosphatase: 75 U/L (ref 38–126)
Anion gap: 5 (ref 5–15)
BUN: 10 mg/dL (ref 6–20)
CO2: 25 mmol/L (ref 22–32)
Calcium: 8.7 mg/dL — ABNORMAL LOW (ref 8.9–10.3)
Chloride: 104 mmol/L (ref 98–111)
Creatinine, Ser: 0.67 mg/dL (ref 0.44–1.00)
GFR, Estimated: >60 mL/min (ref >60)
Glucose, Bld: 113 mg/dL — ABNORMAL HIGH (ref 70–99)
Potassium: 3.3 mmol/L — ABNORMAL LOW (ref 3.5–5.1)
Sodium: 134 mmol/L — ABNORMAL LOW (ref 135–145)
Total Bilirubin: 0.5 mg/dL (ref 0.3–1.2)
Total Protein: 7.9 g/dL (ref 6.5–8.1)

## 2022-01-21 LAB — BRAIN NATRIURETIC PEPTIDE: B Natriuretic Peptide: 16.4 pg/mL (ref 0.0–100.0)

## 2022-01-21 NOTE — ED Provider Notes (Signed)
[facility] EMERGENCY DEPARTMENT Provider Note   CSN: [id number] Arrival date & time: 01/21/22  2159     History  Chief Complaint  Patient presents with   Leg Swelling    Kimberly [name] is a 31 y.o. female.  Patient here with bilateral lower leg swelling mostly in the feet and ankles.  Ongoing for the last week.  History of hypertension.  Denies any chest pain or shortness of breath.  She does not know what makes it worse or better.  She is not having any pain in either leg but sometimes she has some discomfort in her feet.  She states she does not stand a lot.  Does not think that the swelling gets better when she wakes up in the morning.  Denies any weakness, numbness, chills.  Denies history of heart failure.  Denies any problems going to the bathroom.  The history is provided by the patient.       Home Medications Prior to Admission medications   Medication Sig Start Date End Date Taking? Authorizing Provider  cabergoline (DOSTINEX) 0.5 MG tablet Take 0.5 tablets (0.25 mg total) by mouth 2 (two) times a week. 01/13/20   Shamleffer, Konrad Dolores, MD  ferrous sulfate 325 (65 FE) MG tablet Take 325 mg by mouth daily.    [provider]  methocarbamol (ROBAXIN) 500 MG tablet Take 1 tablet (500 mg total) by mouth every 8 (eight) hours as needed for muscle spasms. 05/17/21   Benjiman Core, MD  predniSONE (DELTASONE) 10 MG tablet Take 2 tablets (20 mg total) by mouth daily. 12/20/21   Margarita Grizzle, MD  Vitamin D, Ergocalciferol, (DRISDOL) 1.25 MG (50000 UNIT) CAPS capsule Take 1 capsule (50,000 Units total) by mouth every 7 (seven) days. 12/07/19   Langston Reusing, MD      Allergies    Lemon juice, Orange juice, Citrus, Penicillins, Sulfa antibiotics, Amoxicillin, and Latex    Review of Systems   Review of Systems  Physical Exam Updated Vital Signs BP (!) 145/103 (BP Location: Left Arm)   Pulse 99   Temp 98.2 F (36.8 C) (Oral)   Resp 18   Ht 5'  8" (1.727 m)   Wt 136.1 kg   SpO2 97%   BMI 45.61 kg/m  Physical Exam Vitals and nursing note reviewed.  Constitutional:      General: She is not in acute distress.    Appearance: She is well-developed. She is not ill-appearing.  HENT:     Head: Normocephalic and atraumatic.     Nose: Nose normal.  Eyes:     Extraocular Movements: Extraocular movements intact.     Conjunctiva/sclera: Conjunctivae normal.     Pupils: Pupils are equal, round, and reactive to light.  Cardiovascular:     Rate and Rhythm: Normal rate and regular rhythm.     Pulses: Normal pulses.     Heart sounds: Normal heart sounds. No murmur heard. Pulmonary:     Effort: Pulmonary effort is normal. No respiratory distress.     Breath sounds: Normal breath sounds.  Abdominal:     Palpations: Abdomen is soft.     Tenderness: There is no abdominal tenderness.  Musculoskeletal:        General: No swelling.     Cervical back: Normal range of motion and neck supple.     Comments: Edema both lower extremities but no obvious chronic venous stasis changes, nonpitting  Skin:    General: Skin is warm and  dry.     Capillary Refill: Capillary refill takes less than 2 seconds.  Neurological:     General: No focal deficit present.     Mental Status: She is alert.  Psychiatric:        Mood and Affect: Mood normal.     ED Results / Procedures / Treatments   Labs (all labs ordered are listed, but only abnormal results are displayed) Labs Reviewed  CBC WITH DIFFERENTIAL/PLATELET  COMPREHENSIVE METABOLIC PANEL  BRAIN NATRIURETIC PEPTIDE    EKG None  Radiology No results found.  Procedures Procedures    Medications Ordered in ED Medications - No data to display  ED Course/ Medical Decision Making/ A&P                           Medical Decision Making Amount and/or Complexity of Data Reviewed Labs: ordered. Radiology: ordered.   [name] is here with lower leg swelling for the last week or so.   Differential diagnosis is likely venous stasis/leaky veins.  She is not having any major respiratory symptoms.  She has had echocardiogram in the past that was unremarkable.  But will get lab work including CBC, CMP, BNP, chest x-ray, EKG to evaluate for other signs of volume overload including heart failure, liver failure, renal failure.  My suspicion is that she will need compression socks and may be low-dose of a diuretic for several days to help get some fluid off and close follow-up with her primary care doctor.  Patient was handed off to oncoming ED staff with patient pending work-up.  This chart was dictated using voice recognition software.  Despite best efforts to proofread,  errors can occur which can change the documentation meaning.         Final Clinical Impression(s) / ED Diagnoses Final diagnoses:  Peripheral edema    Rx / DC Orders ED Discharge Orders     None         Virgina Norfolk, DO 01/21/22 2232

## 2022-01-21 NOTE — ED Triage Notes (Signed)
Pt states feet and ankles swelling X 1 week. Denies injury.

## 2022-01-22 ENCOUNTER — Other Ambulatory Visit: Payer: Self-pay

## 2022-01-22 ENCOUNTER — Emergency Department (HOSPITAL_BASED_OUTPATIENT_CLINIC_OR_DEPARTMENT_OTHER)
Admission: EM | Admit: 2022-01-22 | Discharge: 2022-01-22 | Disposition: A | Discharge status: Home / Self Care | Payer: BC Managed Care – PPO | Source: Home / Self Care

## 2022-01-23 ENCOUNTER — Encounter (INDEPENDENT_AMBULATORY_CARE_PROVIDER_SITE_OTHER): Payer: Self-pay

## 2022-04-04 ENCOUNTER — Encounter (HOSPITAL_BASED_OUTPATIENT_CLINIC_OR_DEPARTMENT_OTHER): Payer: Self-pay

## 2022-04-04 ENCOUNTER — Other Ambulatory Visit: Payer: Self-pay

## 2022-04-04 DIAGNOSIS — I10 Essential (primary) hypertension: Secondary | ICD-10-CM | POA: Insufficient documentation

## 2022-04-04 DIAGNOSIS — N898 Other specified noninflammatory disorders of vagina: Secondary | ICD-10-CM | POA: Diagnosis present

## 2022-04-04 DIAGNOSIS — K649 Unspecified hemorrhoids: Secondary | ICD-10-CM | POA: Diagnosis not present

## 2022-04-04 DIAGNOSIS — Z91040 Latex allergy status: Secondary | ICD-10-CM | POA: Diagnosis not present

## 2022-04-04 NOTE — ED Triage Notes (Signed)
Pt presents with complaints of pelvic and rectal pain. Pt reports clear, excessive drainage from vagina, denies foul smell. Reports pain in perineum region, states it feels like "a vein" is swollen.

## 2022-04-05 ENCOUNTER — Emergency Department (HOSPITAL_BASED_OUTPATIENT_CLINIC_OR_DEPARTMENT_OTHER)
Admission: EM | Admit: 2022-04-05 | Discharge: 2022-04-05 | Disposition: A | Discharge status: Home / Self Care | Payer: BC Managed Care – PPO | Attending: Emergency Medicine | Admitting: Emergency Medicine

## 2022-04-05 DIAGNOSIS — A599 Trichomoniasis, unspecified: Secondary | ICD-10-CM

## 2022-04-05 DIAGNOSIS — K649 Unspecified hemorrhoids: Secondary | ICD-10-CM

## 2022-04-05 LAB — URINALYSIS, ROUTINE W REFLEX MICROSCOPIC
Bilirubin Urine: NEGATIVE
Glucose, UA: NEGATIVE mg/dL
Ketones, ur: NEGATIVE mg/dL
Nitrite: NEGATIVE
Specific Gravity, Urine: 1.03 (ref 1.005–1.030)
pH: 6 (ref 5.0–8.0)

## 2022-04-05 LAB — WET PREP, GENITAL
Clue Cells Wet Prep HPF POC: NONE SEEN
Sperm: NONE SEEN
WBC, Wet Prep HPF POC: >=10 — AB (ref <10)
Yeast Wet Prep HPF POC: NONE SEEN

## 2022-04-05 LAB — PREGNANCY, URINE: Preg Test, Ur: NEGATIVE

## 2022-04-05 MED ORDER — CEFTRIAXONE SODIUM 500 MG IJ SOLR
500.0000 mg | Freq: Once | INTRAMUSCULAR | Status: AC
  Administered 2022-04-05: 500 mg via INTRAMUSCULAR
  Filled 2022-04-05: qty 500

## 2022-04-05 MED ORDER — HYDROCORTISONE (PERIANAL) 2.5 % EX CREA: 1.0000 | TOPICAL_CREAM | Freq: Two times a day (BID) | CUTANEOUS | 0 refills | Status: DC

## 2022-04-05 MED ORDER — AZITHROMYCIN 250 MG PO TABS
1000.0000 mg | ORAL_TABLET | Freq: Once | ORAL | Status: AC
  Administered 2022-04-05: 1000 mg via ORAL
  Filled 2022-04-05: qty 4

## 2022-04-05 MED ORDER — METRONIDAZOLE 500 MG PO TABS: 500.0000 mg | ORAL_TABLET | Freq: Two times a day (BID) | ORAL | 0 refills | Status: DC

## 2022-04-05 NOTE — ED Provider Notes (Signed)
Kimberly [name]   CSN: [id number] Arrival date & time: 04/04/22  2332     History  Chief Complaint  Patient presents with   Abdominal Pain    Kimberly [name] is a 31 y.o. female.  Patient is a 31 year old female with past medical history of morbid obesity, hypertension.  Patient presenting today for evaluation of vaginal discharge and rectal pain.  This has been hurting for the past week.  She states that she feels a lump next to her rectum that swells, then goes down, then swells again.  Over the past several days it has been persistent and painful.  She denies constipation, bloody stool, or straining with bowel movements.  She also describes a vaginal discharge and vaginal irritation.  She is sexually active with the same partner.  She denies any fevers or chills.  The history is provided by the patient.       Home Medications Prior to Admission medications   Medication Sig Start Date End Date Taking? Authorizing Provider  cabergoline (DOSTINEX) 0.5 MG tablet Take 0.5 tablets (0.25 mg total) by mouth 2 (two) times a week. 01/13/20   Shamleffer, Melanie Crazier, MD  ferrous sulfate 325 (65 FE) MG tablet Take 325 mg by mouth daily.    [provider]  methocarbamol (ROBAXIN) 500 MG tablet Take 1 tablet (500 mg total) by mouth every 8 (eight) hours as needed for muscle spasms. 05/17/21   Davonna Belling, MD  predniSONE (DELTASONE) 10 MG tablet Take 2 tablets (20 mg total) by mouth daily. 12/20/21   Pattricia Boss, MD  Vitamin D, Ergocalciferol, (DRISDOL) 1.25 MG (50000 UNIT) CAPS capsule Take 1 capsule (50,000 Units total) by mouth every 7 (seven) days. 12/07/19   Laqueta Linden, MD      Allergies    Lemon juice, Orange juice, Citrus, Penicillins, Sulfa antibiotics, Amoxicillin, and Latex    Review of Systems   Review of Systems  All other systems reviewed and are negative.   Physical Exam Updated Vital Signs BP (!) 146/92  (BP Location: Right Arm)   Pulse 94   Temp 98.5 F (36.9 C) (Oral)   Resp 18   Ht 5\' 7"  (1.702 m)   Wt (!) 157.4 kg   SpO2 99%   BMI 54.35 kg/m  Physical Exam Vitals and nursing [name] reviewed.  Constitutional:      Appearance: She is well-developed.  HENT:     Head: Normocephalic and atraumatic.  Abdominal:     Tenderness: There is no abdominal tenderness.  Genitourinary:    Vagina: Vaginal discharge present. No erythema or tenderness.     Cervix: Normal.     Rectum: Tenderness present.     Comments: There is a moderate whitish discharge noted.  There is an inflamed hemorrhoid noted adjacent to the rectum. Neurological:     Mental Status: She is alert.     ED Results / Procedures / Treatments   Labs (all labs ordered are listed, but only abnormal results are displayed) Labs Reviewed  WET PREP, GENITAL  URINALYSIS, ROUTINE W REFLEX MICROSCOPIC  PREGNANCY, URINE  GC/CHLAMYDIA PROBE AMP ([place]) NOT AT [facility]    EKG None  Radiology No results found.  Procedures Procedures    Medications Ordered in ED Medications - No data to display  ED Course/ Medical Decision Making/ A&P  Patient is a 31 year old female presenting with complaints of vaginal discharge and rectal pain as described in the HPI.  She  arrives here with stable vital signs and is clinically well-appearing.  Examination reveals a small hemorrhoid that is tender to palpation.  This to be treated with Anusol and Preparation H.  Pelvic examination also performed.  Wet prep reveals trichomonas.  She will be treated with Flagyl.  Patient also elects to be treated for [facility] and chlamydia pending cultures.  She will be given Rocephin and Zithromax.  Final Clinical Impression(s) / ED Diagnoses Final diagnoses:  None    Rx / DC Orders ED Discharge Orders     None         Geoffery Lyons, MD 04/05/22 [phone]

## 2022-04-05 NOTE — Discharge Instructions (Signed)
Begin taking Flagyl as prescribed.  Alternate Anusol cream and Preparation H several times daily for the next week.  Return to the emergency department if symptoms significantly worsen or change.

## 2022-04-08 LAB — GC/CHLAMYDIA PROBE AMP (~~LOC~~) NOT AT ARMC
Chlamydia: NEGATIVE
Comment: NEGATIVE
Comment: NORMAL
Neisseria Gonorrhea: NEGATIVE

## 2022-08-30 ENCOUNTER — Emergency Department (HOSPITAL_BASED_OUTPATIENT_CLINIC_OR_DEPARTMENT_OTHER): Payer: Medicaid Other

## 2022-08-30 ENCOUNTER — Encounter (HOSPITAL_BASED_OUTPATIENT_CLINIC_OR_DEPARTMENT_OTHER): Payer: Self-pay | Admitting: Emergency Medicine

## 2022-08-30 ENCOUNTER — Other Ambulatory Visit: Payer: Self-pay

## 2022-08-30 ENCOUNTER — Emergency Department (HOSPITAL_BASED_OUTPATIENT_CLINIC_OR_DEPARTMENT_OTHER)
Admission: EM | Admit: 2022-08-30 | Discharge: 2022-08-30 | Disposition: A | Discharge status: Home / Self Care | Payer: Medicaid Other | Attending: Emergency Medicine | Admitting: Emergency Medicine

## 2022-08-30 DIAGNOSIS — N8500 Endometrial hyperplasia, unspecified: Secondary | ICD-10-CM | POA: Insufficient documentation

## 2022-08-30 DIAGNOSIS — Z91040 Latex allergy status: Secondary | ICD-10-CM | POA: Insufficient documentation

## 2022-08-30 DIAGNOSIS — Z79899 Other long term (current) drug therapy: Secondary | ICD-10-CM | POA: Insufficient documentation

## 2022-08-30 DIAGNOSIS — N200 Calculus of kidney: Secondary | ICD-10-CM

## 2022-08-30 DIAGNOSIS — R9389 Abnormal findings on diagnostic imaging of other specified body structures: Secondary | ICD-10-CM

## 2022-08-30 DIAGNOSIS — I10 Essential (primary) hypertension: Secondary | ICD-10-CM | POA: Insufficient documentation

## 2022-08-30 LAB — COMPREHENSIVE METABOLIC PANEL
ALT: 9 U/L (ref 0–44)
AST: 18 U/L (ref 15–41)
Albumin: 4.1 g/dL (ref 3.5–5.0)
Alkaline Phosphatase: 62 U/L (ref 38–126)
Anion gap: 10 (ref 5–15)
BUN: 8 mg/dL (ref 6–20)
CO2: 24 mmol/L (ref 22–32)
Calcium: 9.5 mg/dL (ref 8.9–10.3)
Chloride: 102 mmol/L (ref 98–111)
Creatinine, Ser: 0.6 mg/dL (ref 0.44–1.00)
GFR, Estimated: >60 mL/min (ref >60)
Glucose, Bld: 103 mg/dL — ABNORMAL HIGH (ref 70–99)
Potassium: 3.9 mmol/L (ref 3.5–5.1)
Sodium: 136 mmol/L (ref 135–145)
Total Bilirubin: 0.5 mg/dL (ref 0.3–1.2)
Total Protein: 8 g/dL (ref 6.5–8.1)

## 2022-08-30 LAB — CBC WITH DIFFERENTIAL/PLATELET
Abs Immature Granulocytes: 0.02 10*3/uL (ref 0.00–0.07)
Basophils Absolute: 0 10*3/uL (ref 0.0–0.1)
Basophils Relative: 0 %
Eosinophils Absolute: 0.2 10*3/uL (ref 0.0–0.5)
Eosinophils Relative: 2 %
HCT: 35.3 % — ABNORMAL LOW (ref 36.0–46.0)
Hemoglobin: 10.9 g/dL — ABNORMAL LOW (ref 12.0–15.0)
Immature Granulocytes: 0 %
Lymphocytes Relative: 32 %
Lymphs Abs: 3 10*3/uL (ref 0.7–4.0)
MCH: 22.4 pg — ABNORMAL LOW (ref 26.0–34.0)
MCHC: 30.9 g/dL (ref 30.0–36.0)
MCV: 72.5 fL — ABNORMAL LOW (ref 80.0–100.0)
Monocytes Absolute: 0.5 10*3/uL (ref 0.1–1.0)
Monocytes Relative: 6 %
Neutro Abs: 5.6 10*3/uL (ref 1.7–7.7)
Neutrophils Relative %: 60 %
Platelets: 299 10*3/uL (ref 150–400)
RBC: 4.87 MIL/uL (ref 3.87–5.11)
RDW: 15.1 % (ref 11.5–15.5)
WBC: 9.3 10*3/uL (ref 4.0–10.5)
nRBC: 0 % (ref 0.0–0.2)

## 2022-08-30 LAB — LIPASE, BLOOD: Lipase: <10 U/L — ABNORMAL LOW (ref 11–51)

## 2022-08-30 LAB — HCG, QUANTITATIVE, PREGNANCY: hCG, Beta Chain, Quant, S: <1 m[IU]/mL (ref <5)

## 2022-08-30 MED ORDER — OXYCODONE-ACETAMINOPHEN 5-325 MG PO TABS
1.0000 | ORAL_TABLET | Freq: Once | ORAL | Status: AC
  Administered 2022-08-30: 1 via ORAL
  Filled 2022-08-30: qty 1

## 2022-08-30 MED ORDER — METHOCARBAMOL 500 MG PO TABS: 500.0000 mg | ORAL_TABLET | Freq: Two times a day (BID) | ORAL | 0 refills | Status: DC

## 2022-08-30 MED ORDER — KETOROLAC TROMETHAMINE 10 MG PO TABS: 10.0000 mg | ORAL_TABLET | Freq: Four times a day (QID) | ORAL | 0 refills | Status: DC | PRN

## 2022-08-30 MED ORDER — IOHEXOL 300 MG/ML  SOLN
100.0000 mL | Freq: Once | INTRAMUSCULAR | Status: AC | PRN
  Administered 2022-08-30: 100 mL via INTRAVENOUS

## 2022-08-30 NOTE — ED Provider Notes (Signed)
[facility] Provider Note   CSN: [id number] Arrival date & time: 08/30/22  1306     History  Chief Complaint  Patient presents with   Flank Pain    Kimberly [name] is a 33 y.o. female past medical history of anemia, hypertension presents today for evaluation of left-sided flank pain.  Patient reports she has had left-sided flank pain that radiates to her left leg since last Friday.  She denies any recent injury or fall.  She denies any urinary symptoms, extremity weakness or numbness.  She denies any urinary symptoms, blood in her stool or urine.  She denies any abdominal pain.  He denies fever.   Flank Pain      Past Medical History:  Diagnosis Date   Anemia    Back pain    Hypertension    Multiple food allergies    Obesity    Ovarian cyst    left side   Pneumonia    Pregnancy induced hypertension    Pregnant    Past Surgical History:  Procedure Laterality Date   CESAREAN SECTION  04/09/2011   Procedure: CESAREAN SECTION;  Surgeon: Daria Pastures;  Location: [facility];  Service: Gynecology;  Laterality: N/A;   CESAREAN SECTION N/A 07/21/2014   Procedure: CESAREAN SECTION;  Surgeon: Farrel Gobble. Harrington Challenger, MD;  Location: [facility];  Service: Obstetrics;  Laterality: N/A;   DILATION AND CURETTAGE OF UTERUS     DILITATION & CURRETTAGE/HYSTROSCOPY WITH NOVASURE ABLATION N/A 03/31/2019   Procedure: DILATATION & CURETTAGE/HYSTEROSCOPY WITH NOVASURE ABLATION;  Surgeon: Bobbye Charleston, MD;  Location: [facility];  Service: Gynecology;  Laterality: N/A;   KNEE ARTHROSCOPY     TONSILLECTOMY     TUBAL LIGATION     WISDOM TOOTH EXTRACTION       Home Medications Prior to Admission medications   Medication Sig Start Date End Date Taking? Authorizing Provider  ketorolac (TORADOL) 10 MG tablet Take 1 tablet (10 mg total) by mouth every 6 (six) hours as needed. 08/30/22  Yes Rex Kras, PA  methocarbamol (ROBAXIN) 500 MG tablet Take 1 tablet (500 mg  total) by mouth 2 (two) times daily. 08/30/22  Yes Rex Kras, PA  cabergoline (DOSTINEX) 0.5 MG tablet Take 0.5 tablets (0.25 mg total) by mouth 2 (two) times a week. 01/13/20   Shamleffer, Melanie Crazier, MD  ferrous sulfate 325 (65 FE) MG tablet Take 325 mg by mouth daily.    [provider]  hydrocortisone (ANUSOL-HC) 2.5 % rectal cream Place 1 Application rectally 2 (two) times daily. 04/05/22   Veryl Speak, MD  methocarbamol (ROBAXIN) 500 MG tablet Take 1 tablet (500 mg total) by mouth every 8 (eight) hours as needed for muscle spasms. 05/17/21   Davonna Belling, MD  metroNIDAZOLE (FLAGYL) 500 MG tablet Take 1 tablet (500 mg total) by mouth 2 (two) times daily. One po bid x 7 days 04/05/22   Veryl Speak, MD  predniSONE (DELTASONE) 10 MG tablet Take 2 tablets (20 mg total) by mouth daily. 12/20/21   Pattricia Boss, MD  Vitamin D, Ergocalciferol, (DRISDOL) 1.25 MG (50000 UNIT) CAPS capsule Take 1 capsule (50,000 Units total) by mouth every 7 (seven) days. 12/07/19   Laqueta Linden, MD      Allergies    Lemon juice, Orange juice, Citrus, Penicillins, Sulfa antibiotics, Amoxicillin, and Latex    Review of Systems   Review of Systems  Genitourinary:  Positive for flank pain.    Physical  Exam Updated Vital Signs BP 138/61 (BP Location: Right Arm)   Pulse 94   Temp 98.7 F (37.1 C) (Oral)   Resp 17   SpO2 100%  Physical Exam Vitals and nursing note reviewed.  Constitutional:      Appearance: Normal appearance.  HENT:     Head: Normocephalic and atraumatic.     Mouth/Throat:     Mouth: Mucous membranes are moist.  Eyes:     General: No scleral icterus. Cardiovascular:     Rate and Rhythm: Normal rate and regular rhythm.     Pulses: Normal pulses.     Heart sounds: Normal heart sounds.  Pulmonary:     Effort: Pulmonary effort is normal.     Breath sounds: Normal breath sounds.  Abdominal:     General: Abdomen is flat.     Palpations: Abdomen is soft.      Tenderness: There is no abdominal tenderness.  Musculoskeletal:        General: No deformity.     Comments: TTP to left-sided flank and right lower back.  No midline tenderness to lower back.  Skin:    General: Skin is warm.     Findings: No rash.  Neurological:     General: No focal deficit present.     Mental Status: She is alert.  Psychiatric:        Mood and Affect: Mood normal.     ED Results / Procedures / Treatments   Labs (all labs ordered are listed, but only abnormal results are displayed) Labs Reviewed  CBC WITH DIFFERENTIAL/PLATELET - Abnormal; Notable for the following components:      Result Value   Hemoglobin 10.9 (*)    HCT 35.3 (*)    MCV 72.5 (*)    MCH 22.4 (*)    All other components within normal limits  COMPREHENSIVE METABOLIC PANEL - Abnormal; Notable for the following components:   Glucose, Bld 103 (*)    All other components within normal limits  LIPASE, BLOOD - Abnormal; Notable for the following components:   Lipase <10 (*)    All other components within normal limits  HCG, QUANTITATIVE, PREGNANCY  URINALYSIS, ROUTINE W REFLEX MICROSCOPIC    EKG None  Radiology CT ABDOMEN PELVIS W CONTRAST  Result Date: 08/30/2022 CLINICAL DATA:  Left flank pain. EXAM: CT ABDOMEN AND PELVIS WITH CONTRAST TECHNIQUE: Multidetector CT imaging of the abdomen and pelvis was performed using the standard protocol following bolus administration of intravenous contrast. RADIATION DOSE REDUCTION: This exam was performed according to the departmental dose-optimization program which includes automated exposure control, adjustment of the mA and/or kV according to patient size and/or use of iterative reconstruction technique. CONTRAST:  154mL OMNIPAQUE IOHEXOL 300 MG/ML  SOLN COMPARISON:  CT abdomen pelvis dated 09/06/2015. FINDINGS: Lower chest: No acute abnormality. Hepatobiliary: No focal liver abnormality is seen. No gallstones, gallbladder wall thickening, or biliary  dilatation. Pancreas: Unremarkable. No pancreatic ductal dilatation or surrounding inflammatory changes. Spleen: Normal in size without focal abnormality. Adrenals/Urinary Tract: Adrenal glands are unremarkable. A 2.3 cm cyst is seen in the left kidney. No imaging follow-up is recommended for this finding. Multiple nonobstructive left renal calculi are seen with the largest measuring 9 mm. There is a 2 mm nonobstructing right renal calculus. No hydronephrosis on either side. Bladder is unremarkable. Stomach/Bowel: Stomach is within normal limits. Appendix appears normal. No evidence of bowel wall thickening, distention, or inflammatory changes. Vascular/Lymphatic: No significant vascular findings are present. No enlarged abdominal  or pelvic lymph nodes. Reproductive: The endometrial cavity measures approximately 14 mm in thickness (series 6, image 79). Surgical clips are seen in the location of the fallopian tubes. Other: No abdominal wall hernia or abnormality. No abdominopelvic ascites. Musculoskeletal: No acute or significant osseous findings. IMPRESSION: 1. Nonobstructive bilateral renal calculi. 2. Endometrial cavity measures approximately 14 mm in thickness which may reflect borderline endometrial thickening versus fluid within the endometrial cavity. Electronically Signed   By: Zerita Boers M.D.   On: 08/30/2022 16:19    Procedures Procedures    Medications Ordered in ED Medications  oxyCODONE-acetaminophen (PERCOCET/ROXICET) 5-325 MG per tablet 1 tablet (1 tablet Oral Given 08/30/22 1420)  iohexol (OMNIPAQUE) 300 MG/ML solution 100 mL (100 mLs Intravenous Contrast Given 08/30/22 1552)    ED Course/ Medical Decision Making/ A&P                             Medical Decision Making Amount and/or Complexity of Data Reviewed Labs: ordered. Radiology: ordered.  Risk Prescription drug management.   This patient presents to the ED for flank pain, this involves an extensive number of treatment  options, and is a complaint that carries with a high risk of complications and morbidity.  The differential diagnosis includes AAA rupture, DKA perforated viscus, SBO, acute gastroenteritis, kidney stone, appendicitis, diverticulitis.  This is not an exhaustive list.  Lab tests: I ordered and personally interpreted labs.  The pertinent results include: WBC unremarkable. Hbg unremarkable. Platelets unremarkable. Electrolytes unremarkable. BUN, creatinine unremarkable. [place] pending.  Imaging studies: I ordered imaging studies. I personally reviewed, interpreted imaging and agree with the radiologist's interpretations. The results include: CT scan showed  Problem list/ ED course/ Critical interventions/ Medical management: HPI: See above Vital signs within normal range and stable throughout visit. Laboratory/imaging studies significant for: See above. On physical examination, patient is afebrile and appears in no acute distress. Patient presents with flank pain likely secondary to renal colic from likely non-obstructed non infected kidney stone. Given history, exam, and work up I have low suspicion for atypical appendicitis, testicular torsion, acute cholecystitis, infected obstructed stone, pyelonephritis, or other emergent intraabdominal pathology. Symptoms and UA indicate no infection. BMP witohut evidence of AKI.  CT scan also showed thickening of the endometrial layer. Pain treated in ED with percocet. Patient appropriate for discharge with outpatient urology and OB/GYN follow-up. I have reviewed the patient home medicines and have made adjustments as needed.  Cardiac monitoring/EKG: The patient was maintained on a cardiac monitor.  I personally reviewed and interpreted the cardiac monitor which showed an underlying rhythm of: sinus rhythm.  Additional history obtained: External records from outside source obtained and reviewed including: Chart review including previous notes, labs,  imaging.  Consultations obtained:  Disposition Continued outpatient therapy. Follow-up with PCP recommended for reevaluation of symptoms. Treatment plan discussed with patient.  Pt acknowledged understanding was agreeable to the plan. Worrisome signs and symptoms were discussed with patient, and patient acknowledged understanding to return to the ED if they noticed these signs and symptoms. Patient was stable upon discharge.   This chart was dictated using voice recognition software.  Despite best efforts to proofread,  errors can occur which can change the documentation meaning.          Final Clinical Impression(s) / ED Diagnoses Final diagnoses:  Renal calculus  Endometrial thickening on ultrasound    Rx / DC Orders ED Discharge Orders  Ordered    ketorolac (TORADOL) 10 MG tablet  Every 6 hours PRN        08/30/22 1728    methocarbamol (ROBAXIN) 500 MG tablet  2 times daily        08/30/22 1728              Rex Kras, PA 08/30/22 [place], [place], DO 08/31/22 [phone]

## 2022-08-30 NOTE — ED Triage Notes (Addendum)
Pt reports left sided flank pain and left leg pain that started last Friday. Pt reports it runs down the back of her leg. Hurts worse with movement. Reports diarrhea and nausea. Denies urinary symptoms.

## 2022-08-30 NOTE — Discharge Instructions (Addendum)
Your CT scan today showed thickening of the endometrial cavity and some renal stones. Take tylenol, Toradol and Robaxin, apply ice/heat packs for pain. I recommend close follow-up with urology and OB/GYN for reevaluation.  Please do not hesitate to return to emergency department if worrisome signs symptoms we discussed become apparent.

## 2022-12-10 ENCOUNTER — Encounter (HOSPITAL_BASED_OUTPATIENT_CLINIC_OR_DEPARTMENT_OTHER): Payer: Self-pay

## 2022-12-10 ENCOUNTER — Emergency Department (HOSPITAL_BASED_OUTPATIENT_CLINIC_OR_DEPARTMENT_OTHER)
Admission: EM | Admit: 2022-12-10 | Discharge: 2022-12-11 | Disposition: A | Discharge status: Home / Self Care | Payer: Managed Care, Other (non HMO) | Attending: Emergency Medicine | Admitting: Emergency Medicine

## 2022-12-10 ENCOUNTER — Other Ambulatory Visit: Payer: Self-pay

## 2022-12-10 DIAGNOSIS — I10 Essential (primary) hypertension: Secondary | ICD-10-CM | POA: Diagnosis not present

## 2022-12-10 DIAGNOSIS — R509 Fever, unspecified: Secondary | ICD-10-CM | POA: Diagnosis present

## 2022-12-10 DIAGNOSIS — Z1152 Encounter for screening for COVID-19: Secondary | ICD-10-CM | POA: Diagnosis not present

## 2022-12-10 DIAGNOSIS — R6883 Chills (without fever): Secondary | ICD-10-CM | POA: Diagnosis not present

## 2022-12-10 LAB — URINALYSIS, ROUTINE W REFLEX MICROSCOPIC
Bilirubin Urine: NEGATIVE
Glucose, UA: NEGATIVE mg/dL
Ketones, ur: NEGATIVE mg/dL
Nitrite: NEGATIVE
Specific Gravity, Urine: 1.027 (ref 1.005–1.030)
pH: 6.5 (ref 5.0–8.0)

## 2022-12-10 LAB — RESP PANEL BY RT-PCR (RSV, FLU A&B, COVID)  RVPGX2
Influenza A by PCR: NEGATIVE
Influenza B by PCR: NEGATIVE
Resp Syncytial Virus by PCR: NEGATIVE
SARS Coronavirus 2 by RT PCR: NEGATIVE

## 2022-12-10 LAB — CBG MONITORING, ED: Glucose-Capillary: 119 mg/dL — ABNORMAL HIGH (ref 70–99)

## 2022-12-10 MED ORDER — ACETAMINOPHEN 325 MG PO TABS
650.0000 mg | ORAL_TABLET | Freq: Once | ORAL | Status: AC | PRN
Start: 2022-12-10 — End: 2022-12-10
  Administered 2022-12-10: 650 mg via ORAL
  Filled 2022-12-10: qty 2

## 2022-12-10 NOTE — ED Triage Notes (Signed)
Patient here POV from Home.  Endorses Headache, Subjective Fever, Chills, Aches that began today. 12 Hours ago symptoms began.   No Sore Throat. No Cough.   NAD Noted during Triage. A&Ox4. GCS 15. Ambulatory.

## 2022-12-10 NOTE — ED Notes (Signed)
Pt. States she's had a fever today but didn't take her temp and has no idea what it was. Took motrin at around 8pm

## 2022-12-11 NOTE — Discharge Instructions (Signed)
You were seen today for chills and concern for fever.  He did not have a fever here.  Your workup has been fairly unrevealing.  Take Tylenol and ibuprofen.  Make sure that you are getting plenty of rest and hydrating.

## 2022-12-11 NOTE — ED Provider Notes (Signed)
[place] EMERGENCY DEPARTMENT AT [facility] Provider Note   CSN: [id number] Arrival date & time: 12/10/22  2221     History  Chief Complaint  Patient presents with   Fever    STAPHANY DITTON is a 32 y.o. female.  HPI     This is a 32 year old female who presents with chills.  She thinks she has a fever.  She took Motrin at 8:00 but states it did not help.  She states she has been laying under a blanket all day.  She denies any other significant symptoms including upper respiratory symptoms such as cough or sore throat.  Denies urinary symptoms, chest pain, shortness of breath, abdominal pain, nausea, vomiting.  Unknown sick contacts.  Home Medications Prior to Admission medications   Medication Sig Start Date End Date Taking? Authorizing Provider  cabergoline (DOSTINEX) 0.5 MG tablet Take 0.5 tablets (0.25 mg total) by mouth 2 (two) times a week. 01/13/20   Shamleffer, Konrad Dolores, MD  ferrous sulfate 325 (65 FE) MG tablet Take 325 mg by mouth daily.    [provider]  hydrocortisone (ANUSOL-HC) 2.5 % rectal cream Place 1 Application rectally 2 (two) times daily. 04/05/22   Geoffery Lyons, MD  ketorolac (TORADOL) 10 MG tablet Take 1 tablet (10 mg total) by mouth every 6 (six) hours as needed. 08/30/22   Jeanelle Malling, PA  methocarbamol (ROBAXIN) 500 MG tablet Take 1 tablet (500 mg total) by mouth every 8 (eight) hours as needed for muscle spasms. 05/17/21   Benjiman Core, MD  methocarbamol (ROBAXIN) 500 MG tablet Take 1 tablet (500 mg total) by mouth 2 (two) times daily. 08/30/22   Jeanelle Malling, PA  metroNIDAZOLE (FLAGYL) 500 MG tablet Take 1 tablet (500 mg total) by mouth 2 (two) times daily. One po bid x 7 days 04/05/22   Geoffery Lyons, MD  predniSONE (DELTASONE) 10 MG tablet Take 2 tablets (20 mg total) by mouth daily. 12/20/21   Margarita Grizzle, MD  Vitamin D, Ergocalciferol, (DRISDOL) 1.25 MG (50000 UNIT) CAPS capsule Take 1 capsule (50,000 Units total) by mouth  every 7 (seven) days. 12/07/19   Langston Reusing, MD      Allergies    Lemon juice, Orange juice, Citrus, Penicillins, Sulfa antibiotics, Amoxicillin, and Latex    Review of Systems   Review of Systems  Constitutional:  Positive for chills. Negative for fever.  Respiratory:  Negative for cough and shortness of breath.   Cardiovascular:  Negative for chest pain.  Genitourinary:  Negative for dysuria.  All other systems reviewed and are negative.   Physical Exam Updated Vital Signs BP (!) 146/88 (BP Location: Right Arm)   Pulse (!) 121   Temp 99.1 F (37.3 C) (Oral)   Resp 18   Ht 1.727 m (5\' 8" )   Wt (!) 181.4 kg   SpO2 99%   BMI 60.82 kg/m  Physical Exam Vitals and nursing note reviewed.  Constitutional:      Appearance: She is well-developed. She is obese.  HENT:     Head: Normocephalic and atraumatic.  Eyes:     Pupils: Pupils are equal, round, and reactive to light.  Cardiovascular:     Rate and Rhythm: Regular rhythm. Tachycardia present.     Heart sounds: Normal heart sounds.  Pulmonary:     Effort: Pulmonary effort is normal. No respiratory distress.     Breath sounds: No wheezing.  Abdominal:     General: Bowel sounds are normal.  Palpations: Abdomen is soft.  Musculoskeletal:     Cervical back: Neck supple.  Skin:    General: Skin is warm and dry.  Neurological:     Mental Status: She is alert and oriented to person, place, and time.  Psychiatric:        Mood and Affect: Mood normal.     ED Results / Procedures / Treatments   Labs (all labs ordered are listed, but only abnormal results are displayed) Labs Reviewed  URINALYSIS, ROUTINE W REFLEX MICROSCOPIC - Abnormal; Notable for the following components:      Result Value   APPearance HAZY (*)    Hgb urine dipstick TRACE (*)    Protein, ur TRACE (*)    Leukocytes,Ua MODERATE (*)    Bacteria, UA RARE (*)    All other components within normal limits  CBG MONITORING, ED - Abnormal;  Notable for the following components:   Glucose-Capillary 119 (*)    All other components within normal limits  RESP PANEL BY RT-PCR (RSV, FLU A&B, COVID)  RVPGX2    EKG None  Radiology No results found.  Procedures Procedures    Medications Ordered in ED Medications  acetaminophen (TYLENOL) tablet 650 mg (650 mg Oral Given 12/10/22 2241)    ED Course/ Medical Decision Making/ A&P                             Medical Decision Making Amount and/or Complexity of Data Reviewed Labs: ordered.  Risk OTC drugs.   This patient presents to the ED for concern of chills, this involves an extensive number of treatment options, and is a complaint that carries with it a high risk of complications and morbidity.  I considered the following differential and admission for this acute, potentially life threatening condition.  The differential diagnosis includes febrile, viral illness, bacterial infection such as pneumonia or UTI  MDM:    This is a 32 year old female who presents with chills.  She is overall nontoxic.  Vital signs notable for a temperature of 99.1.  She is tachycardic.  Suspect her temperature may be higher than documented.  She is not ill-appearing.  Physical exam is largely reassuring.  No obvious source.  COVID and influenza testing are negative.  Urinalysis is not consistent with UTI and she is otherwise asymptomatic.  Given absence of upper respiratory symptoms or cough, do not feel she warrants x-ray imaging at this time.  Recommend supportive measures.  (Labs, imaging, consults)  Labs: I Ordered, and personally interpreted labs.  The pertinent results include: COVID, influenza testing, urinalysis  Imaging Studies ordered: I ordered imaging studies including none I independently visualized and interpreted imaging. I agree with the radiologist interpretation  Additional history obtained from chart review.  External records from outside source obtained and reviewed  including prior evaluations  Cardiac Monitoring: The patient was maintained on a cardiac monitor.  If on the cardiac monitor, I personally viewed and interpreted the cardiac monitored which showed an underlying rhythm of: Sinus rhythm  Reevaluation: After the interventions noted above, I reevaluated the patient and found that they have :improved  Social Determinants of Health:  lives independently  Disposition: Discharge  Co morbidities that complicate the patient evaluation  Past Medical History:  Diagnosis Date   Anemia    Back pain    Hypertension    Multiple food allergies    Obesity    Ovarian cyst    left  side   Pneumonia    Pregnancy induced hypertension    Pregnant      Medicines Meds ordered this encounter  Medications   acetaminophen (TYLENOL) tablet 650 mg    I have reviewed the patients home medicines and have made adjustments as needed  Problem List / ED Course: Problem List Items Addressed This Visit   None Visit Diagnoses     Chills    -  Primary                   Final Clinical Impression(s) / ED Diagnoses Final diagnoses:  Chills    Rx / DC Orders ED Discharge Orders     None         Shon Baton, MD 12/11/22 [phone]

## 2022-12-13 ENCOUNTER — Encounter (HOSPITAL_COMMUNITY): Payer: Self-pay

## 2022-12-13 ENCOUNTER — Other Ambulatory Visit: Payer: Self-pay

## 2022-12-13 ENCOUNTER — Emergency Department (HOSPITAL_COMMUNITY): Payer: Managed Care, Other (non HMO)

## 2022-12-13 ENCOUNTER — Emergency Department (HOSPITAL_COMMUNITY)
Admission: EM | Admit: 2022-12-13 | Discharge: 2022-12-13 | Disposition: A | Discharge status: Home / Self Care | Payer: Managed Care, Other (non HMO) | Attending: Emergency Medicine | Admitting: Emergency Medicine

## 2022-12-13 DIAGNOSIS — R519 Headache, unspecified: Secondary | ICD-10-CM | POA: Insufficient documentation

## 2022-12-13 DIAGNOSIS — Z91040 Latex allergy status: Secondary | ICD-10-CM | POA: Diagnosis not present

## 2022-12-13 DIAGNOSIS — I10 Essential (primary) hypertension: Secondary | ICD-10-CM | POA: Insufficient documentation

## 2022-12-13 DIAGNOSIS — G932 Benign intracranial hypertension: Secondary | ICD-10-CM

## 2022-12-13 LAB — COMPREHENSIVE METABOLIC PANEL
ALT: 13 U/L (ref 0–44)
AST: 18 U/L (ref 15–41)
Albumin: 3.5 g/dL (ref 3.5–5.0)
Alkaline Phosphatase: 63 U/L (ref 38–126)
Anion gap: 9 (ref 5–15)
BUN: 7 mg/dL (ref 6–20)
CO2: 22 mmol/L (ref 22–32)
Calcium: 8.2 mg/dL — ABNORMAL LOW (ref 8.9–10.3)
Chloride: 102 mmol/L (ref 98–111)
Creatinine, Ser: 0.66 mg/dL (ref 0.44–1.00)
GFR, Estimated: >60 mL/min (ref >60)
Glucose, Bld: 89 mg/dL (ref 70–99)
Potassium: 3.8 mmol/L (ref 3.5–5.1)
Sodium: 133 mmol/L — ABNORMAL LOW (ref 135–145)
Total Bilirubin: 0.7 mg/dL (ref 0.3–1.2)
Total Protein: 7.6 g/dL (ref 6.5–8.1)

## 2022-12-13 LAB — CBC WITH DIFFERENTIAL/PLATELET
Abs Immature Granulocytes: 0 10*3/uL (ref 0.00–0.07)
Basophils Absolute: 0 10*3/uL (ref 0.0–0.1)
Basophils Relative: 0 %
Eosinophils Absolute: 0.2 10*3/uL (ref 0.0–0.5)
Eosinophils Relative: 6 %
HCT: 33.9 % — ABNORMAL LOW (ref 36.0–46.0)
Hemoglobin: 10.3 g/dL — ABNORMAL LOW (ref 12.0–15.0)
Lymphocytes Relative: 49 %
Lymphs Abs: 1.3 10*3/uL (ref 0.7–4.0)
MCH: 22.3 pg — ABNORMAL LOW (ref 26.0–34.0)
MCHC: 30.4 g/dL (ref 30.0–36.0)
MCV: 73.5 fL — ABNORMAL LOW (ref 80.0–100.0)
Monocytes Absolute: 0.2 10*3/uL (ref 0.1–1.0)
Monocytes Relative: 9 %
Neutro Abs: 0.9 10*3/uL — ABNORMAL LOW (ref 1.7–7.7)
Neutrophils Relative %: 36 %
Platelets: 207 10*3/uL (ref 150–400)
RBC: 4.61 MIL/uL (ref 3.87–5.11)
RDW: 14.8 % (ref 11.5–15.5)
WBC: 2.6 10*3/uL — ABNORMAL LOW (ref 4.0–10.5)
nRBC: 0 % (ref 0.0–0.2)

## 2022-12-13 LAB — CSF CULTURE W GRAM STAIN

## 2022-12-13 LAB — PREGNANCY, URINE: Preg Test, Ur: NEGATIVE

## 2022-12-13 LAB — PROTEIN AND GLUCOSE, CSF
Glucose, CSF: 56 mg/dL (ref 40–70)
Total  Protein, CSF: 24 mg/dL (ref 15–45)

## 2022-12-13 LAB — CSF CELL COUNT WITH DIFFERENTIAL
RBC Count, CSF: 1 /mm3 — ABNORMAL HIGH
WBC, CSF: 1 /mm3 (ref 0–5)

## 2022-12-13 MED ORDER — ONDANSETRON HCL 4 MG/2ML IJ SOLN
4.0000 mg | Freq: Once | INTRAMUSCULAR | Status: DC
  Filled 2022-12-13: qty 2

## 2022-12-13 MED ORDER — IOHEXOL 350 MG/ML SOLN
75.0000 mL | Freq: Once | INTRAVENOUS | Status: AC | PRN
  Administered 2022-12-13: 75 mL via INTRAVENOUS

## 2022-12-13 MED ORDER — ACETAZOLAMIDE ER 500 MG PO CP12: 500.0000 mg | ORAL_CAPSULE | Freq: Two times a day (BID) | ORAL | 1 refills | Status: DC

## 2022-12-13 MED ORDER — SODIUM CHLORIDE 0.9 % IV BOLUS
1000.0000 mL | Freq: Once | INTRAVENOUS | Status: AC
  Administered 2022-12-13: 1000 mL via INTRAVENOUS

## 2022-12-13 MED ORDER — ACETAMINOPHEN 500 MG PO TABS
1000.0000 mg | ORAL_TABLET | Freq: Once | ORAL | Status: AC
  Administered 2022-12-13: 1000 mg via ORAL
  Filled 2022-12-13: qty 2

## 2022-12-13 MED ORDER — KETOROLAC TROMETHAMINE 30 MG/ML IJ SOLN
30.0000 mg | Freq: Once | INTRAMUSCULAR | Status: AC
  Administered 2022-12-13: 30 mg via INTRAVENOUS
  Filled 2022-12-13: qty 1

## 2022-12-13 NOTE — Discharge Instructions (Signed)
When starting the medications it is normal to have some finger tingling at first and for carbonated drinks to taste weird.  You need to follow up with neurology and a referral was made.  They should call you for appointment.

## 2022-12-13 NOTE — ED Triage Notes (Signed)
Pt arrived POV. States has not been feeling well for the last week. Reports migraine, blurred vision, intermittent sweats and low blood pressure. States she was evaluated earlier this week. Denies any other symptoms. NAD noted on arrival. AAOX4, resp even and unlabored

## 2022-12-13 NOTE — Procedures (Signed)
Fluoroscopically-guided lumbar puncture performed at the L3-L4 level.  Opening pressure: 45 cm water.  24 mL of CSF collected and sent for laboratory studies.  Closing pressure: 18 cm water.

## 2022-12-13 NOTE — ED Provider Notes (Signed)
[place] EMERGENCY DEPARTMENT AT [facility] Provider Note   CSN: [id number] Arrival date & time: 12/13/22  0751     History  Chief Complaint  Patient presents with   Migraine   Excessive Sweating    Kimberly [name] is a 32 y.o. female.  HPI   32 year old female with past medical history of obesity, HTN, anemia presents to the emergency department with concern for general unwell feeling for the past week now associated with headache.  Patient states the past week she has had having fatigue and chills.  Was evaluated a couple days ago in the ER with negative COVID and urine testing.  She states 2 days ago a gradual generalized headache developed and has been persistent since.  It is present at night, sometimes disrupting sleep.  Over the past days she has been developing intermittent horizontal diplopia but denies any vision loss.  She has associated mild nausea but denies any vomiting.  No neck pain or stiffness.  Reports subjective fevers at home.  Is not currently on any hormone/birth control.  Home Medications Prior to Admission medications   Medication Sig Start Date End Date Taking? Authorizing Provider  cabergoline (DOSTINEX) 0.5 MG tablet Take 0.5 tablets (0.25 mg total) by mouth 2 (two) times a week. 01/13/20   Shamleffer, Konrad Dolores, MD  ferrous sulfate 325 (65 FE) MG tablet Take 325 mg by mouth daily.    [provider]  hydrocortisone (ANUSOL-HC) 2.5 % rectal cream Place 1 Application rectally 2 (two) times daily. 04/05/22   Geoffery Lyons, MD  ketorolac (TORADOL) 10 MG tablet Take 1 tablet (10 mg total) by mouth every 6 (six) hours as needed. 08/30/22   Jeanelle Malling, PA  methocarbamol (ROBAXIN) 500 MG tablet Take 1 tablet (500 mg total) by mouth every 8 (eight) hours as needed for muscle spasms. 05/17/21   Benjiman Core, MD  methocarbamol (ROBAXIN) 500 MG tablet Take 1 tablet (500 mg total) by mouth 2 (two) times daily. 08/30/22   Jeanelle Malling, PA   metroNIDAZOLE (FLAGYL) 500 MG tablet Take 1 tablet (500 mg total) by mouth 2 (two) times daily. One po bid x 7 days 04/05/22   Geoffery Lyons, MD  predniSONE (DELTASONE) 10 MG tablet Take 2 tablets (20 mg total) by mouth daily. 12/20/21   Margarita Grizzle, MD  Vitamin D, Ergocalciferol, (DRISDOL) 1.25 MG (50000 UNIT) CAPS capsule Take 1 capsule (50,000 Units total) by mouth every 7 (seven) days. 12/07/19   Langston Reusing, MD      Allergies    Lemon juice, Orange juice, Citrus, Penicillins, Sulfa antibiotics, Amoxicillin, and Latex    Review of Systems   Review of Systems  Constitutional:  Positive for appetite change, chills, fatigue and fever.  Eyes:  Positive for visual disturbance. Negative for photophobia.  Respiratory:  Negative for shortness of breath.   Cardiovascular:  Negative for chest pain.  Gastrointestinal:  Negative for abdominal pain, diarrhea and vomiting.  Musculoskeletal:  Negative for neck pain and neck stiffness.  Skin:  Negative for rash.  Neurological:  Positive for headaches. Negative for dizziness, seizures, facial asymmetry, speech difficulty and weakness.    Physical Exam Updated Vital Signs BP (!) 156/85 (BP Location: Right Arm)   Pulse 97   Temp 98.8 F (37.1 C) (Oral)   Resp 16   Ht 5\' 8"  (1.727 m)   Wt (!) 181 kg   SpO2 96%   BMI 60.67 kg/m  Physical Exam Vitals and nursing  note reviewed.  Constitutional:      Appearance: Normal appearance. She is obese.  HENT:     Head: Normocephalic.     Mouth/Throat:     Mouth: Mucous membranes are moist.  Eyes:     Extraocular Movements: Extraocular movements intact.     Pupils: Pupils are equal, round, and reactive to light.  Cardiovascular:     Rate and Rhythm: Normal rate.  Pulmonary:     Effort: Pulmonary effort is normal. No respiratory distress.  Abdominal:     Palpations: Abdomen is soft.     Tenderness: There is no abdominal tenderness.  Musculoskeletal:     Cervical back: Neck supple. No  rigidity.  Skin:    General: Skin is warm.  Neurological:     General: No focal deficit present.     Mental Status: She is alert and oriented to person, place, and time. Mental status is at baseline.     Cranial Nerves: No cranial nerve deficit.  Psychiatric:        Mood and Affect: Mood normal.     ED Results / Procedures / Treatments   Labs (all labs ordered are listed, but only abnormal results are displayed) Labs Reviewed  CBC WITH DIFFERENTIAL/PLATELET  COMPREHENSIVE METABOLIC PANEL  PREGNANCY, URINE    EKG None  Radiology No results found.  Procedures Procedures    Medications Ordered in ED Medications - No data to display  ED Course/ Medical Decision Making/ A&P                             Medical Decision Making Amount and/or Complexity of Data Reviewed Labs: ordered. Radiology: ordered.  Risk OTC drugs. Prescription drug management.   32 year old female presents emergency department generalized headache, pain with ocular movements and double vision.  Blood pressure and vitals are appropriate on arrival, she appears fatigued.  Neuroexam is reassuring on exam but she does still endorse horizontal diplopia.  Blood work is reassuring, CT head/CTV show findings concerning for idiopathic intracranial hypertension.  Consulted with neurology, Dr. Amada Jupiter who recommends lumbar puncture.  Consulted with radiology for fluoroscopy guided lumbar puncture.  Dr. Cain Sieve the physician. This was successful.  They mention that the opening pressure was around 47, they did drain her therapeutically per neurorecommendations down to around 24.  Spoke again with neurology who has given [place] recommendations on medication.  Prescription has been sent.  Ambulatory referral has been placed.  Patient and mother at bedside has been educated and understand.  Patient will be allowed to eat and drink and plan for outpatient follow-up with neurology.  Patient at this time appears  safe and stable for discharge and close outpatient follow up. Discharge plan and strict return to ED precautions discussed, patient verbalizes understanding and agreement.        Final Clinical Impression(s) / ED Diagnoses Final diagnoses:  None    Rx / DC Orders ED Discharge Orders     None         Rozelle Logan, DO 12/13/22 1732

## 2022-12-14 LAB — CSF CELL COUNT WITH DIFFERENTIAL: Tube #: 3

## 2022-12-14 LAB — CSF CULTURE W GRAM STAIN

## 2022-12-15 ENCOUNTER — Telehealth: Payer: Managed Care, Other (non HMO) | Admitting: Physician Assistant

## 2022-12-15 ENCOUNTER — Telehealth: Payer: Managed Care, Other (non HMO) | Admitting: Family Medicine

## 2022-12-15 DIAGNOSIS — L509 Urticaria, unspecified: Secondary | ICD-10-CM

## 2022-12-15 DIAGNOSIS — R21 Rash and other nonspecific skin eruption: Secondary | ICD-10-CM

## 2022-12-15 MED ORDER — PREDNISONE 10 MG PO TABS: 20.0000 mg | ORAL_TABLET | Freq: Every day | ORAL | 0 refills | Status: AC

## 2022-12-15 NOTE — Progress Notes (Signed)
E Visit for Rash  We are sorry that you are not feeling well. Here is how we plan to help!    I have prescribed prednisone twice daily for 5 days.    HOME CARE:  Take cool showers and avoid direct sunlight. Apply cool compress or wet dressings. Take a bath in an oatmeal bath.  Sprinkle content of one Aveeno packet under running faucet with comfortably warm water.  Bathe for 15-20 minutes, 1-2 times daily.  Pat dry with a towel. Do not rub the rash. Use hydrocortisone cream. Take an antihistamine like Benadryl for widespread rashes that itch.  The adult dose of Benadryl is 25-50 mg by mouth 4 times daily. Caution:  This type of medication may cause sleepiness.  Do not drink alcohol, drive, or operate dangerous machinery while taking antihistamines.  Do not take these medications if you have prostate enlargement.  Read package instructions thoroughly on all medications that you take.  GET HELP RIGHT AWAY IF:  Symptoms don't go away after treatment. Severe itching that persists. If you rash spreads or swells. If you rash begins to smell. If it blisters and opens or develops a yellow-brown crust. You develop a fever. You have a sore throat. You become short of breath.  MAKE SURE YOU:  Understand these instructions. Will watch your condition. Will get help right away if you are not doing well or get worse.  Thank you for choosing an e-visit.  Your e-visit answers were reviewed by a board certified advanced clinical practitioner to complete your personal care plan. Depending upon the condition, your plan could have included both over the counter or prescription medications.  Please review your pharmacy choice. Make sure the pharmacy is open so you can pick up prescription now. If there is a problem, you may contact your provider through [employer] and have the prescription routed to another pharmacy.  Your safety is important to [place]. If you have drug allergies check your  prescription carefully.   For the next 24 hours you can use MyChart to ask questions about today's visit, request a non-urgent call back, or ask for a work or school excuse. You will get an email in the next two days asking about your experience. I hope that your e-visit has been valuable and will speed your recovery.    have provided 5 minutes of non face to face time during this encounter for chart review and documentation.

## 2022-12-15 NOTE — Addendum Note (Signed)
Addended by: Georgana Curio on: 12/15/2022 10:52 AM   Modules accepted: Orders, Level of Service

## 2022-12-15 NOTE — Progress Notes (Signed)
   Thank you for the details you included in the comment boxes. Those details are very helpful in determining the best course of treatment for you and help [place] to provide the best care.Because Ms. Neubert, we recommend that you convert this visit to a video visit in order for the provider to better assess what is going on.  The provider will be able to give you a more accurate diagnosis and treatment plan if we can more freely discuss your symptoms and with the addition of a virtual examination.   If you convert to a video visit, we will bill your insurance (similar to an office visit) and you will not be charged for this e-Visit. You will be able to stay at home and speak with the first available [facility] advanced practice provider. The link to do a video visit is in the drop down Menu tab of your Welcome screen in MyChart.     have provided 5 minutes of non face to face time during this encounter for chart review and documentation.

## 2022-12-15 NOTE — Progress Notes (Signed)
Virtual Visit Consent   [name], you are scheduled for a virtual visit with a [facility] provider today. Just as with appointments in the office, your consent must be obtained to participate. Your consent will be active for this visit and any virtual visit you may have with one of our providers in the next 365 days. If you have a MyChart account, a copy of this consent can be sent to you electronically.  As this is a virtual visit, video technology does not allow for your provider to perform a traditional examination. This may limit your provider's ability to fully assess your condition. If your provider identifies any concerns that need to be evaluated in person or the need to arrange testing (such as labs, EKG, etc.), we will make arrangements to do so. Although advances in technology are sophisticated, we cannot ensure that it will always work on either your end or our end. If the connection with a video visit is poor, the visit may have to be switched to a telephone visit. With either a video or telephone visit, we are not always able to ensure that we have a secure connection.  By engaging in this virtual visit, you consent to the provision of healthcare and authorize for your insurance to be billed (if applicable) for the services provided during this visit. Depending on your insurance coverage, you may receive a charge related to this service.  I need to obtain your verbal consent now. Are you willing to proceed with your visit today? [name] has provided verbal consent on 12/15/2022 for a virtual visit (video or telephone). [name], PA-C  Date: 12/15/2022 11:10 AM  Virtual Visit via Video Note   I, [name], connected with  [name]  ([id number], [birth year]) on 12/15/22 at 10:45 AM EDT by a video-enabled telemedicine application and verified that I am speaking with the correct person using two identifiers.  Location: Patient: Virtual Visit Location  Patient: Home Provider: Virtual Visit Location Provider: Home   I discussed the limitations of evaluation and management by telemedicine and the availability of in person appointments. The patient expressed understanding and agreed to proceed.    History of Present Illness: Kimberly [name] is a 32 y.o. who identifies as a female who was assigned female at birth, and is being seen today for continued back pain following a lumbar puncture in ED 12/13/22.  She was also started on Diamox and feels this is causing hives.  She states she started with hives to legs and arms yesterday.  Denies trouble swallowing or breathing.  She will be reaching out to neurology on tomorrow for follow up.   HPI: HPI  Problems:  Patient Active Problem List   Diagnosis Date Noted   Galactorrhea 01/12/2020   Hyperprolactinemia (HCC) 01/11/2020   Insulin resistance 01/11/2020   Parapneumonic effusion 07/26/2014   HTN (hypertension) 07/26/2014   Hypokalemia 07/26/2014   Leukocytosis 07/26/2014   Healthcare-associated pneumonia    Maternal morbid obesity, delivered, current hospitalization (HCC) 07/21/2014   PIH (pregnancy induced hypertension) 07/19/2014   Maternal morbid obesity, antepartum (HCC)    Prior pregnancy complicated by IUGR, antepartum    Hx of preeclampsia, prior pregnancy, currently pregnant     Allergies:  Allergies  Allergen Reactions   Lemon Juice Itching and Other (See Comments)    Tongue swelling   Orange Juice Itching and Other (See Comments)    Tongue swelling   Citrus Itching and Swelling  Tongue swelling   Penicillins Itching    DID THE REACTION INVOLVE: Swelling of the face/tongue/throat, SOB, or low BP? No Sudden or severe rash/hives, skin peeling, or the inside of the mouth or nose? No Did it require medical treatment? No When did it last happen?       If all above answers are "NO", may proceed with cephalosporin use.    Sulfa Antibiotics Itching   Amoxicillin Rash   Latex  Itching and Rash   Medications:  Current Outpatient Medications:    acetaZOLAMIDE ER (DIAMOX) 500 MG capsule, Take 1 capsule (500 mg total) by mouth 2 (two) times daily., Disp: 60 capsule, Rfl: 1   cabergoline (DOSTINEX) 0.5 MG tablet, Take 0.5 tablets (0.25 mg total) by mouth 2 (two) times a week., Disp: 12 tablet, Rfl: 1   ferrous sulfate 325 (65 FE) MG tablet, Take 325 mg by mouth daily., Disp: , Rfl:    hydrocortisone (ANUSOL-HC) 2.5 % rectal cream, Place 1 Application rectally 2 (two) times daily., Disp: 30 g, Rfl: 0   ketorolac (TORADOL) 10 MG tablet, Take 1 tablet (10 mg total) by mouth every 6 (six) hours as needed., Disp: 20 tablet, Rfl: 0   methocarbamol (ROBAXIN) 500 MG tablet, Take 1 tablet (500 mg total) by mouth every 8 (eight) hours as needed for muscle spasms., Disp: 8 tablet, Rfl: 0   methocarbamol (ROBAXIN) 500 MG tablet, Take 1 tablet (500 mg total) by mouth 2 (two) times daily., Disp: 20 tablet, Rfl: 0   metroNIDAZOLE (FLAGYL) 500 MG tablet, Take 1 tablet (500 mg total) by mouth 2 (two) times daily. One po bid x 7 days, Disp: 14 tablet, Rfl: 0   predniSONE (DELTASONE) 10 MG tablet, Take 2 tablets (20 mg total) by mouth daily., Disp: 10 tablet, Rfl: 0   Vitamin D, Ergocalciferol, (DRISDOL) 1.25 MG (50000 UNIT) CAPS capsule, Take 1 capsule (50,000 Units total) by mouth every 7 (seven) days., Disp: 4 capsule, Rfl: 0  Observations/Objective: Patient is well-developed, well-nourished in no acute distress.  Resting comfortably at home.  Head is normocephalic, atraumatic.  No labored breathing.  Speech is clear and coherent with logical content.  Patient is alert and oriented at baseline.    Assessment and Plan: 1. Hives  Pt feels the diamox recently started is causing the reaction.  Advised pt to reach out to neurology tomorrow.  She denies trouble breathing or shortness of breath.  Reports headache has improved.  ED precautions given.   Follow Up Instructions: I discussed  the assessment and treatment plan with the patient. The patient was provided an opportunity to ask questions and all were answered. The patient agreed with the plan and demonstrated an understanding of the instructions.  A copy of instructions were sent to the patient via MyChart unless otherwise noted below.     The patient was advised to call back or seek an in-person evaluation if the symptoms worsen or if the condition fails to improve as anticipated.  Time:  I spent 10 minutes with the patient via telehealth technology discussing the above problems/concerns.    [name], PA-C

## 2022-12-15 NOTE — Patient Instructions (Signed)
  Kimberly [name], thank you for joining [name], PA-C for today's virtual visit.  While this provider is not your primary care provider (PCP), if your PCP is located in our provider database this encounter information will be shared with them immediately following your visit.   A [facility] MyChart account gives you access to today's visit and all your visits, tests, and labs performed at [facility] " click here if you don't have a [facility] MyChart account or go to [email or web address]  Consent: (Patient) Kimberly [name] provided verbal consent for this virtual visit at the beginning of the encounter.  Current Medications:  Current Outpatient Medications:    acetaZOLAMIDE ER (DIAMOX) 500 MG capsule, Take 1 capsule (500 mg total) by mouth 2 (two) times daily., Disp: 60 capsule, Rfl: 1   cabergoline (DOSTINEX) 0.5 MG tablet, Take 0.5 tablets (0.25 mg total) by mouth 2 (two) times a week., Disp: 12 tablet, Rfl: 1   ferrous sulfate 325 (65 FE) MG tablet, Take 325 mg by mouth daily., Disp: , Rfl:    hydrocortisone (ANUSOL-HC) 2.5 % rectal cream, Place 1 Application rectally 2 (two) times daily., Disp: 30 g, Rfl: 0   ketorolac (TORADOL) 10 MG tablet, Take 1 tablet (10 mg total) by mouth every 6 (six) hours as needed., Disp: 20 tablet, Rfl: 0   methocarbamol (ROBAXIN) 500 MG tablet, Take 1 tablet (500 mg total) by mouth every 8 (eight) hours as needed for muscle spasms., Disp: 8 tablet, Rfl: 0   methocarbamol (ROBAXIN) 500 MG tablet, Take 1 tablet (500 mg total) by mouth 2 (two) times daily., Disp: 20 tablet, Rfl: 0   metroNIDAZOLE (FLAGYL) 500 MG tablet, Take 1 tablet (500 mg total) by mouth 2 (two) times daily. One po bid x 7 days, Disp: 14 tablet, Rfl: 0   predniSONE (DELTASONE) 10 MG tablet, Take 2 tablets (20 mg total) by mouth daily., Disp: 10 tablet, Rfl: 0   Vitamin D, Ergocalciferol, (DRISDOL) 1.25 MG (50000 UNIT) CAPS capsule, Take 1 capsule (50,000 Units total)  by mouth every 7 (seven) days., Disp: 4 capsule, Rfl: 0   Medications ordered in this encounter:  No orders of the defined types were placed in this encounter.    *If you need refills on other medications prior to your next appointment, please contact your pharmacy*  Follow-Up: Call back or seek an in-person evaluation if the symptoms worsen or if the condition fails to improve as anticipated.  [facility] [phone]  Other Instructions Please contact neurology tomorrow.  Recommend to continue to lie flat following lumbar puncture.  If you are experience worsening symptoms please follow up for in person evaluation.    If you have been instructed to have an in-person evaluation today at a local Urgent Care facility, please use the link below. It will take you to a list of all of our available [facility], including address, phone number and hours of operation. Please do not delay care.  [facility]  If you or a family member do not have a primary care provider, use the link below to schedule a visit and establish care. When you choose a [facility] primary care physician or advanced practice provider, you gain a long-term partner in health. Find a Primary Care Provider  Learn more about 's in-office and virtual care options:  - Get Care Now

## 2022-12-16 LAB — CSF CULTURE W GRAM STAIN: Gram Stain: NONE SEEN

## 2022-12-17 ENCOUNTER — Ambulatory Visit (INDEPENDENT_AMBULATORY_CARE_PROVIDER_SITE_OTHER): Payer: Managed Care, Other (non HMO) | Admitting: Neurology

## 2022-12-17 ENCOUNTER — Encounter: Payer: Self-pay | Admitting: Neurology

## 2022-12-17 ENCOUNTER — Telehealth: Payer: Self-pay | Admitting: Neurology

## 2022-12-17 VITALS — BP 87/54 | HR 86 | Ht 68.0 in | Wt >= 6400 oz

## 2022-12-17 DIAGNOSIS — H532 Diplopia: Secondary | ICD-10-CM

## 2022-12-17 DIAGNOSIS — E221 Hyperprolactinemia: Secondary | ICD-10-CM

## 2022-12-17 DIAGNOSIS — E232 Diabetes insipidus: Secondary | ICD-10-CM | POA: Diagnosis not present

## 2022-12-17 DIAGNOSIS — G932 Benign intracranial hypertension: Secondary | ICD-10-CM | POA: Diagnosis not present

## 2022-12-17 LAB — CSF CULTURE W GRAM STAIN

## 2022-12-17 MED ORDER — TOPIRAMATE 25 MG PO TABS: 50.0000 mg | ORAL_TABLET | Freq: Two times a day (BID) | ORAL | 3 refills | Status: AC

## 2022-12-17 NOTE — Progress Notes (Signed)
SLEEP MEDICINE CLINIC    Provider:  Melvyn Novas, MD  Primary Care Physician:    Referring Provider: Gwyneth Sprout, Md [address] [place],  [place] [address]          Chief Complaint according to patient   Patient presents with:     New Patient (Initial Visit)           HISTORY OF PRESENT ILLNESS:  Kimberly [name] is a 32 y.o. female patient who is seen upon Emergency Room referral on 12/17/2022 For IIH.   Chief concern according to patient :     I have the pleasure of seeing Kimberly [name] on  12/17/22 - This is a pleasant African-American 32 year old female patient has a past medical history of superobesity of hypertension and of anemia she presented on Tuesday last week to the emergency room at [facility] and was dismissed after being given tylenol.  The following Wednesday she felt still excruciating headaches very low blood pressure and just feeling unwell overall.  On Friday she felt severly ill, had to urinate every hiurs, couldn't sleep and felt as if her head was crushed between a vice.  She presented to [facility] hospital and presented this time at [place] long to a more receptive audience.  She the patient has been on cabergoline Dostinex for a pituitary adenoma, she is on ferrous sulfate for iron deficiency, on Toradol that was given as a as needed medication she is currently not taking it, she had been on Robaxin in the past this is not an active medication, she was given a Deltasone Dosepak in 2023 this also by ED physician but is not continuously on it and so at this time she is only on Dostinex iron and and no blood pressure or diabetes treatment has been necessary.  The patient reported excessive sweating and diplopia which finally led to her being evaluated by a CT of the head this venogram also dated 12-13-2022.  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  This study was read at 1430 minutes patient had a lumbar puncture in the emergency room and it showed an opening pressure of 45 cm water only 24 mL of CSF were collected the closing pressure was 18 cmH2O at the time.  The chemistry was not abnormal.  She is basically clearly an idiopathic intracranial hypertension patient her diplopia has resolved she still feels fatigued and not very well she was placed on Diamox and took only 3 doses when she realized that it caused her to have skin breakout and hives.  I already spoke to her about Topiramate and she has no history of renal stones and would be interested.    The patient never  had the a sleep study.    Sleep relevant medical history: pituitary adenoma, prolactinoma, galactorrhea.  nocturia, migraines, severe hypoventilation. Snoring, IIH.     Family medical /sleep history: No other family member on CPAP with OSA, insomnia, sleep walkers.    Social history:  Patient is working as Merchandiser, retail for [employer],  and lives in a household with  2 children and partner.Tobacco use; none.  ETOH use ;none,  Caffeine intake in form of Coffee( seldomly) .     Sleep habits are as follows: The patient's dinner time is between 6.30 PM. The patient goes to bed at 9-9.30  PM and continues to sleep for 8 hours, newly wakes for  bathroom breaks,  She reports hot flushes.  The preferred sleep position is orthopnoeic, with the support of 3 pillows. Dreams are reportedly rare/ frequent/vivid.   Review of Systems: Out of a complete 14 system review, the patient complains of only the following symptoms, and all other reviewed systems are negative.:    HEADACHE diplopia.  Fatigue, sleepiness , snoring, fragmented sleep, Insomnia, RLS, Nocturia    How likely are you to doze in the following situations: 0 = not likely, 1 = slight chance, 2 = moderate chance, 3 = high chance   Sitting and  Reading? Watching Television? Sitting inactive in a public place (theater or meeting)? As a passenger in a car for an hour without a break? Lying down in the afternoon when circumstances permit? Sitting and talking to someone? Sitting quietly after lunch without alcohol? In a car, while stopped for a few minutes in traffic?   Total = 13/ 24 points   FSS endorsed at 34/ 63 points.   Social History   Socioeconomic History   Marital status: Single    Spouse name: Not on file   Number of children: Not on file   Years of education: Not on file   Highest education level: Not on file  Occupational History   Not on file  Tobacco Use   Smoking status: Never   Smokeless tobacco: Never  Vaping Use   Vaping Use: Never used  Substance and Sexual Activity   Alcohol use: No   Drug use: No   Sexual activity: Yes    Birth control/protection: Surgical  Other Topics Concern   Not on file  Social History Narrative   Not on file   Social Determinants of Health   Financial Resource Strain: Not on file  Food Insecurity: Not on file  Transportation Needs: Not on file  Physical Activity: Not on file  Stress: Not on file  Social Connections: Not on file    Family History  Problem Relation Age of Onset   Diabetes Mother    Diabetes Maternal Grandmother    Diabetes Maternal Grandfather    Diabetes Paternal Grandmother    Stroke Paternal Grandmother     Past Medical History:  Diagnosis Date   Anemia    Back pain    Hypertension    Multiple food allergies    Obesity    Ovarian cyst    left side   Pneumonia    Pregnancy induced hypertension    Pregnant     Past Surgical History:  Procedure Laterality Date   CESAREAN SECTION  04/09/2011   Procedure: CESAREAN SECTION;  Surgeon: Loney Laurence;  Location: [facility];  Service: Gynecology;  Laterality: N/A;   CESAREAN SECTION N/A 07/21/2014   Procedure: CESAREAN SECTION;  Surgeon: Freddrick March. Tenny Craw, MD;  Location: [facility];  Service:  Obstetrics;  Laterality: N/A;   DILATION AND CURETTAGE OF UTERUS     DILITATION & CURRETTAGE/HYSTROSCOPY WITH NOVASURE ABLATION N/A 03/31/2019   Procedure: DILATATION & CURETTAGE/HYSTEROSCOPY WITH NOVASURE ABLATION;  Surgeon: Carrington Clamp, MD;  Location: [facility];  Service: Gynecology;  Laterality: N/A;   KNEE ARTHROSCOPY     TONSILLECTOMY     TUBAL LIGATION     WISDOM TOOTH EXTRACTION       Current Outpatient Medications on File Prior to Visit  Medication Sig Dispense Refill   acetaZOLAMIDE ER (DIAMOX) 500 MG capsule Take 1 capsule (500 mg  total) by mouth 2 (two) times daily. 60 capsule 1   cabergoline (DOSTINEX) 0.5 MG tablet Take 0.5 tablets (0.25 mg total) by mouth 2 (two) times a week. 12 tablet 1   ferrous sulfate 325 (65 FE) MG tablet Take 325 mg by mouth daily.     hydrocortisone (ANUSOL-HC) 2.5 % rectal cream Place 1 Application rectally 2 (two) times daily. 30 g 0   ketorolac (TORADOL) 10 MG tablet Take 1 tablet (10 mg total) by mouth every 6 (six) hours as needed. 20 tablet 0   methocarbamol (ROBAXIN) 500 MG tablet Take 1 tablet (500 mg total) by mouth every 8 (eight) hours as needed for muscle spasms. 8 tablet 0   methocarbamol (ROBAXIN) 500 MG tablet Take 1 tablet (500 mg total) by mouth 2 (two) times daily. 20 tablet 0   metroNIDAZOLE (FLAGYL) 500 MG tablet Take 1 tablet (500 mg total) by mouth 2 (two) times daily. One po bid x 7 days 14 tablet 0   predniSONE (DELTASONE) 10 MG tablet Take 2 tablets (20 mg total) by mouth daily. 10 tablet 0   Vitamin D, Ergocalciferol, (DRISDOL) 1.25 MG (50000 UNIT) CAPS capsule Take 1 capsule (50,000 Units total) by mouth every 7 (seven) days. 4 capsule 0   No current facility-administered medications on file prior to visit.    Allergies  Allergen Reactions   Lemon Juice Itching and Other (See Comments)    Tongue swelling   Orange Juice Itching and Other (See Comments)    Tongue swelling   Citrus Itching and Swelling    Tongue  swelling   Penicillins Itching    DID THE REACTION INVOLVE: Swelling of the face/tongue/throat, SOB, or low BP? No Sudden or severe rash/hives, skin peeling, or the inside of the mouth or nose? No Did it require medical treatment? No When did it last happen?       If all above answers are "NO", may proceed with cephalosporin use.    Sulfa Antibiotics Itching   Amoxicillin Rash   Latex Itching and Rash     DIAGNOSTIC DATA (LABS, IMAGING, TESTING) - I reviewed patient records, labs, notes, testing and imaging myself where available.  Lab Results  Component Value Date   WBC 2.6 (L) 12/13/2022   HGB 10.3 (L) 12/13/2022   HCT 33.9 (L) 12/13/2022   MCV 73.5 (L) 12/13/2022   PLT 207 12/13/2022      Component Value Date/Time   NA 133 (L) 12/13/2022 1223   NA 139 11/24/2019 1505   NA 141 01/04/2009 1255   K 3.8 12/13/2022 1223   K 4.0 01/04/2009 1255   CL 102 12/13/2022 1223   CL 99 01/04/2009 1255   CO2 22 12/13/2022 1223   CO2 27 01/04/2009 1255   GLUCOSE 89 12/13/2022 1223   GLUCOSE 93 01/04/2009 1255   BUN 7 12/13/2022 1223   BUN 8 11/24/2019 1505   BUN 10 01/04/2009 1255   CREATININE 0.66 12/13/2022 1223   CREATININE 0.8 01/04/2009 1255   CALCIUM 8.2 (L) 12/13/2022 1223   CALCIUM 9.1 01/04/2009 1255   PROT 7.6 12/13/2022 1223   PROT 7.2 11/24/2019 1505   PROT 7.6 01/04/2009 1255   ALBUMIN 3.5 12/13/2022 1223   ALBUMIN 4.1 11/24/2019 1505   AST 18 12/13/2022 1223   AST 19 01/04/2009 1255   ALT 13 12/13/2022 1223   ALT 22 01/04/2009 1255   ALKPHOS 63 12/13/2022 1223   ALKPHOS 82 01/04/2009 1255   BILITOT 0.7 12/13/2022  1223   BILITOT 0.4 11/24/2019 1505   BILITOT 0.70 01/04/2009 1255   GFRNONAA >60 12/13/2022 1223   GFRAA 114 11/24/2019 1505   Lab Results  Component Value Date   CHOL 130 11/24/2019   HDL 39 (L) 11/24/2019   LDLCALC 73 11/24/2019   TRIG 96 11/24/2019   Lab Results  Component Value Date   HGBA1C 5.2 11/24/2019   Lab Results  Component  Value Date   VITAMINB12 669 11/24/2019   Lab Results  Component Value Date   TSH 1.520 11/24/2019    PHYSICAL EXAM:  Today's Vitals   12/17/22 1324  BP: (!) 87/54  Pulse: 86  Weight: (!) 401 lb 9.6 oz (182.2 kg)  Height: 5\' 8"  (1.727 m)   Body mass index is 61.06 kg/m.   Wt Readings from Last 3 Encounters:  12/17/22 (!) 401 lb 9.6 oz (182.2 kg)  12/13/22 (!) 399 lb 0.5 oz (181 kg)  12/10/22 (!) 400 lb (181.4 kg)     Ht Readings from Last 3 Encounters:  12/17/22 5\' 8"  (1.727 m)  12/13/22 5\' 8"  (1.727 m)  12/10/22 5\' 8"  (1.727 m)      General: The patient is awake, alert and appears not in acute distress. The patient is well groomed. Head: Normocephalic, atraumatic. Neck is supple.  Mallampati 3, plus  neck circumference:18.5 inches . Nasal airflow  patent.  Retrognathia is not seen.  Dental status: biological  Cardiovascular:  Regular rate and cardiac rhythm by pulse,  without distended neck veins. Respiratory: Lungs are clear to auscultation.  Skin:  Without evidence of ankle edema, or rash. Trunk: The patient's posture is erect.   NEUROLOGIC EXAM: The patient is awake and alert, oriented to place and time.   Memory subjective described as intact.  Attention span & concentration ability appears normal.  Speech is fluent,  without  dysarthria, dysphonia or aphasia.  Mood and affect are appropriate.   Cranial nerves: no loss of smell or taste reported  Pupils are equal and briskly reactive to light. Funduscopic exam deferred. Slight cloudy lens/.  Extraocular movements in vertical and horizontal planes with nystagmus. No Diplopia. Visual fields by finger perimetry are intact. Hearing was intact to soft voice and finger rubbing.    Facial sensation intact to fine touch.  Facial motor strength is symmetric and tongue and uvula move midline.  Neck ROM : rotation, tilt and flexion extension were normal for age and shoulder shrug was symmetrical.    Motor exam:   Symmetric bulk, tone and ROM.   Normal tone without cog wheeling, symmetric grip strength .   Sensory:  Fine touch, pinprick and vibration were t normal.  Proprioception tested in the upper extremities was normal.   Coordination: Rapid alternating movements in the fingers/hands were of normal speed.  The Finger-to-nose maneuver was intact without evidence of ataxia, dysmetria or tremor.   Gait and station: Patient could rise unassisted from a seated position, walked without assistive device.  Stance is of  wider base .  Toe and heel walk were deferred.  Deep tendon reflexes: in the  upper and lower extremities are symmetric and intact.  Babinski response was deferred.    ASSESSMENT AND PLAN 32 y.o. year old female  here with:    1) urgent MRI Brain pituitary study- I need to order a pituitary study. Pituitary dysfunction, morning sickness, polyuria and diplopia,  galactorrhea. Rule out pituitary apoplexy.   2) IIH start Topiramate, 50 mg bid .  3) Needs Primary care to be established - I will order pituitary labs and HST ASAP    I plan to follow up either personally or through our NP within 2-4 months.   I would like to thank Pcp, No and Gwyneth Sprout, Md [address] [place],  [place] [address] for allowing me to meet with and to take care of this pleasant patient.     After spending a total time of  45  minutes face to face and additional time for physical and neurologic examination, review of laboratory studies,  personal review of imaging studies, reports and results of other testing and review of referral information / records as far as provided in visit,   Electronically signed by: Melvyn Novas, MD 12/17/2022 1:43 PM  [facility] Board certified by The [employer] of Sleep Medicine and Diplomate of the [employer] of Sleep Medicine. Board certified In Neurology through the ABPN, Fellow of the [employer] of  Neurology

## 2022-12-17 NOTE — Telephone Encounter (Signed)
Called [employer] to initiate [facility] for urgent MRI brain w/wo (including pituitary study), spoke with Darlene L. She was able to give approval over the phone: [id number] (12/17/22-06/15/23).  I faxed signed order to [facility] for open MRI, pt is 401 lbs. [facility]'s number is [phone].

## 2022-12-17 NOTE — Patient Instructions (Signed)
ASSESSMENT AND PLAN 32 y.o. year old female  here with:    1) urgent MRI Brain pituitary study- I need to order a pituitary study. Pituitary dysfunction, morning sickness, polyuria and diplopia,  galactorrhea. Rule out pituitary apoplexy.   2) IIH start Topiramate, 50 mg bid .  3) Needs Primary care to be established - I will order pituitary labs and HST ASAP    I plan to follow up either personally or through our NP within 2-4 months.

## 2022-12-18 ENCOUNTER — Telehealth: Payer: Self-pay | Admitting: Neurology

## 2022-12-18 NOTE — Telephone Encounter (Signed)
Referral sent in Epic to [facility]

## 2023-01-02 ENCOUNTER — Telehealth: Payer: Self-pay | Admitting: Neurology

## 2023-01-02 NOTE — Telephone Encounter (Signed)
MRI brain w/out contrast was completed I received the report/result  I will show report to Dr Vickey Huger for her to review and result for the pt.

## 2023-01-03 ENCOUNTER — Encounter: Payer: Self-pay | Admitting: Neurology

## 2023-01-03 NOTE — Telephone Encounter (Addendum)
Possible intracranial hypertension, with CSF test opening pressure to follow . No comment about adenoma of the pituitary.   no brain stroke, tumor or scar.

## 2023-01-08 ENCOUNTER — Ambulatory Visit: Payer: Managed Care, Other (non HMO) | Admitting: Neurology

## 2023-01-08 DIAGNOSIS — G4733 Obstructive sleep apnea (adult) (pediatric): Secondary | ICD-10-CM

## 2023-01-08 DIAGNOSIS — E662 Morbid (severe) obesity with alveolar hypoventilation: Secondary | ICD-10-CM

## 2023-01-08 DIAGNOSIS — G932 Benign intracranial hypertension: Secondary | ICD-10-CM

## 2023-01-08 DIAGNOSIS — E221 Hyperprolactinemia: Secondary | ICD-10-CM

## 2023-01-08 DIAGNOSIS — H532 Diplopia: Secondary | ICD-10-CM

## 2023-01-08 DIAGNOSIS — E232 Diabetes insipidus: Secondary | ICD-10-CM

## 2023-01-16 NOTE — Progress Notes (Signed)
[facility]  [name] 32 year old female Nov 14, 1990   HOME SLEEP TEST REPORT ( by [facility])   STUDY DATE: 01-16-2023     ORDERING CLINICIAN: Melvyn Novas, MD  REFERRING CLINICIAN: Sheffield Slider, FNP [facility] [place]   CLINICAL INFORMATION/HISTORY: Kimberly [name] is a 32 y.o. female patient who is seen upon Emergency Room referral on 12/17/2022 For IIH.   Chief concern according to patient :     I have the pleasure of seeing Kimberly [name] on  12/17/22 - This is a pleasant African-American 32 year old female patient has a past medical history of super-obesity, of hypertension, and of anemia- she presented on Tuesday last week to the emergency room at [facility] and was dismissed after being given tylenol.  The following Wednesday she felt still excruciating headaches, had low blood pressure and was just feeling unwell overall.  On Friday, she felt severly ill, had to urinate every hour, couldn't sleep and felt as if her head was "crushed in a vice".  She presented to [facility] and presented this  to a more receptive audience.  The patient has been on cabergoline /Dostinex for a pituitary adenoma, she is on ferrous sulfate for iron deficiency, on Toradol that was given as a as needed medication she is currently not taking, and no blood pressure or diabetes treatment has been necessary.  The patient reported excessive sweating and diplopia which finally led to her being evaluated by a CT of the head CT- venogram also dated 12-13-2022.  This image study showed no acute intracranial abnormality but did partially empty sella the expanded sella , moderate narrowing of the transverse sinus were compatible findings to an idiopathic intracranial hypertension.  This study was read at 1430 minutes patient had a lumbar puncture in the emergency room and it showed an opening pressure of 45 cm water only 24 mL of CSF were collected the closing pressure was 18 cmH2O at the time.  The  CSF chemistry was not abnormal.  She is basically clearly an idiopathic intracranial hypertension patient her diplopia has resolved she still feels fatigued and not well - she was placed on Diamox and took only 3 doses when she realized that it caused her to have skin breakout and hives.   I already spoke to her about Topiramate and she has no history of renal stones and would be interested. The patient never had the a sleep study.   Sleep relevant medical history: pituitary adenoma, prolactinoma, galactorrhea.  nocturia, migraines, severe hypoventilation. Snoring, IIH.     Epworth sleepiness score: 13/24. FSS at 34/ 63 points.    BMI: 60.8 kg/m   Neck Circumference: 19"   FINDINGS:   Sleep Summary:   Total Recording Time (hours, min):   7 hours 1 minute  Total Sleep Time (hours, min):   5 hours 42 minutes              Percent REM (%):   23%                                     Respiratory Indices:   Calculated pAHI (per hour):    31.7/h                         REM pAHI:    56/h  NREM pAHI: 27/h                            Positional AHI: In supine position the AHI was 34.3/h and sleeping on the right side the AHI was 26.1/h. Snoring reached a mean volume of 43 dB and was present for 57% of total sleep time                                                 Oxygen Saturation Statistics:   O2 Saturation Range (%): Between the nadir at 80% of the maximal saturation of 99% with a mean saturation of 93%                                     O2 Saturation (minutes) <89%:   4.1 minutes        Pulse Rate Statistics:   Pulse Mean (bpm): 94 bpm                Pulse Range:   Between 64 130 bpm              IMPRESSION:  This HST confirms the presence of  severe obstructive sleep apnea as calculated by the [facility] device.  No central apneas were found. The REM sleep dependent form of apnea certainly of an obstructive origin.   This patient seems  to have mainly hypopneas and associated moderate severe hypoxia.  This is a constellation that can be seen in obesity hypoventilation.  The recommended treatment with positive airway pressure.   RECOMMENDATION: Auto titrating CPAP device for setting between 5 and 18 cm water pressure with 3 cm EPR, heated humidification and mask of patient's choice.    INTERPRETING PHYSICIAN:   Melvyn Novas, MD   [facility] Sleep Board certified by The [employer] of Sleep Medicine and Diplomate of the [employer] of Sleep Medicine. Board certified In Neurology through the ABPN, Fellow of the [employer] of Neurology

## 2023-01-20 DIAGNOSIS — G932 Benign intracranial hypertension: Secondary | ICD-10-CM | POA: Insufficient documentation

## 2023-01-20 DIAGNOSIS — E232 Diabetes insipidus: Secondary | ICD-10-CM | POA: Insufficient documentation

## 2023-01-20 DIAGNOSIS — H532 Diplopia: Secondary | ICD-10-CM | POA: Insufficient documentation

## 2023-01-20 DIAGNOSIS — E662 Morbid (severe) obesity with alveolar hypoventilation: Secondary | ICD-10-CM | POA: Insufficient documentation

## 2023-01-20 NOTE — Addendum Note (Signed)
Addended by: Melvyn Novas on: 01/20/2023 04:57 PM   Modules accepted: Orders

## 2023-01-20 NOTE — Procedures (Signed)
[facility]  [name] 32 year old female Nov 14, 1990   HOME SLEEP TEST REPORT ( by Watch PAT)   STUDY DATE: 01-16-2023     ORDERING CLINICIAN: Melvyn Novas, MD  REFERRING CLINICIAN: Sheffield Slider, FNP [facility] [place]   CLINICAL INFORMATION/HISTORY: Kimberly [name] is a 32 y.o. female patient who is seen upon Emergency Room referral on 12/17/2022 For IIH.   Chief concern according to patient :     I have the pleasure of seeing Kimberly [name] on  12/17/22 - This is a pleasant African-American 32 year old female patient has a past medical history of super-obesity, of hypertension, and of anemia- she presented on Tuesday last week to the emergency room at [facility] and was dismissed after being given tylenol.  The following Wednesday she felt still excruciating headaches, had low blood pressure and was just feeling unwell overall.  On Friday, she felt severly ill, had to urinate every hour, couldn't sleep and felt as if her head was "crushed in a vice".  She presented to [facility] and presented this  to a more receptive audience.  The patient has been on cabergoline /Dostinex for a pituitary adenoma, she is on ferrous sulfate for iron deficiency, on Toradol that was given as a as needed medication she is currently not taking, and no blood pressure or diabetes treatment has been necessary.  The patient reported excessive sweating and diplopia which finally led to her being evaluated by a CT of the head CT- venogram also dated 12-13-2022.  This image study showed no acute intracranial abnormality but did partially empty sella the expanded sella , moderate narrowing of the transverse sinus were compatible findings to an idiopathic intracranial hypertension.  This study was read at 1430 minutes patient had a lumbar puncture in the emergency room and it showed an opening pressure of 45 cm water only 24 mL of CSF were collected the closing pressure was 18 cmH2O at the time.  The  CSF chemistry was not abnormal.  She is basically clearly an idiopathic intracranial hypertension patient her diplopia has resolved she still feels fatigued and not well - she was placed on Diamox and took only 3 doses when she realized that it caused her to have skin breakout and hives.   I already spoke to her about Topiramate and she has no history of renal stones and would be interested. The patient never had the a sleep study.   Sleep relevant medical history: pituitary adenoma, prolactinoma, galactorrhea.  nocturia, migraines, severe hypoventilation. Snoring, IIH.     Epworth sleepiness score: 13/24. FSS at 34/ 63 points.    BMI: 60.8 kg/m   Neck Circumference: 19"   FINDINGS:   Sleep Summary:   Total Recording Time (hours, min):   7 hours 1 minute  Total Sleep Time (hours, min):   5 hours 42 minutes              Percent REM (%):   23%                                     Respiratory Indices:   Calculated pAHI (per hour):    31.7/h                         REM pAHI:    56/h  NREM pAHI: 27/h                            Positional AHI: In supine position the AHI was 34.3/h and sleeping on the right side the AHI was 26.1/h. Snoring reached a mean volume of 43 dB and was present for 57% of total sleep time                                                 Oxygen Saturation Statistics:   O2 Saturation Range (%): Between the nadir at 80% of the maximal saturation of 99% with a mean saturation of 93%                                     O2 Saturation (minutes) <89%:   4.1 minutes        Pulse Rate Statistics:   Pulse Mean (bpm): 94 bpm                Pulse Range:   Between 64 130 bpm              IMPRESSION:  This HST confirms the presence of  severe obstructive sleep apnea as calculated by the [facility] device.  No central apneas were found. The REM sleep dependent form of apnea certainly of an obstructive origin.   This patient seems  to have mainly hypopneas and associated moderate severe hypoxia.  This is a constellation that can be seen in obesity hypoventilation.  The recommended treatment with positive airway pressure.   RECOMMENDATION: Auto titrating CPAP device for setting between 5 and 18 cm water pressure with 3 cm EPR, heated humidification and mask of patient's choice.    INTERPRETING PHYSICIAN:   Melvyn Novas, MD   [facility] and [facility] Sleep Board certified by The [employer] of Sleep Medicine and Diplomate of the [employer] of Sleep Medicine. Board certified In Neurology through the ABPN, Fellow of the [employer] of Neurology

## 2023-01-21 ENCOUNTER — Telehealth: Payer: Self-pay

## 2023-01-21 NOTE — Telephone Encounter (Signed)
-----   Message from Leando Dohmeier sent at 01/20/2023  4:57 PM EDT -----  This HST confirms the presence of  severe obstructive sleep apnea as calculated by the [facility] device.  No central apneas were found. The REM sleep dependent form of apnea certainly of an obstructive origin.   This patient seems to have mainly hypopneas and associated moderate severe hypoxia.  This is a constellation that can be seen in obesity hypoventilation.  The recommended treatment with positive airway pressure.   RECOMMENDATION: Auto titrating CPAP device for setting between 5 and 18 cm water pressure with 3 cm EPR, heated humidification and mask of patient's choice.

## 2023-01-21 NOTE — Telephone Encounter (Signed)
I called pt. I advised pt that Dr. Vickey Huger reviewed their sleep study results and found that pt has severe OSA. Dr. Vickey Huger recommends that pt start auto titration CPAP. I reviewed autoPAP compliance expectations with the pt. Pt is agreeable to starting a CPAP. I advised pt that an order will be sent to a DME, [employer], and [employer] will call the pt within about one week after they file with the pt's insurance. [employer] will show the pt how to use the machine, fit for masks, and troubleshoot the CPAP if needed. A follow up appt was previously scheduled with Ihor Austin, NP on 04/02/2023. Patient was informed to change the date of her appointment if it is outside of the 60-90 day time frame. Pt verbalized understanding of results. Pt had no questions at this time but was encouraged to call back if questions arise. I have sent the order to [employer].

## 2023-02-24 ENCOUNTER — Telehealth: Payer: Self-pay | Admitting: Neurology

## 2023-02-24 NOTE — Telephone Encounter (Signed)
-----   Message from [facility] sent at 02/24/2023  4:54 PM EDT ----- Regarding: FW: labs pending Hi Lawanna Kobus, can you please call this pt and get her a lab appointment please. Thank you. ----- Message ----- From: Melvyn Novas, MD Sent: 02/20/2023   6:51 PM EDT To: Reatha Armour, CMA Subject: labs pending                                   Patient needs labs drawn before RV in October.  If she got the pituitary hormones drawn elsewhere , please disregard. ----- Message ----- From: SYSTEM Sent: 12/22/2022  12:25 AM EDT To: Melvyn Novas, MD

## 2023-02-24 NOTE — Telephone Encounter (Signed)
Received message from pod regarding labwork. I called pt and relayed message that she needs labs drawn, pt states that she is a hard stick and wants to know if there's something she can do or somewhere else she can go so that she doesn't have to be stuck multiple times.

## 2023-02-25 ENCOUNTER — Encounter: Payer: Self-pay | Admitting: Anesthesiology

## 2023-02-25 NOTE — Telephone Encounter (Signed)
Called pt to remind her she needed blood work before coming to her next appointment with Dr Vickey Huger. I told pt I was going to have the front desk reach out to her to get her scheduled for labs.

## 2023-04-02 ENCOUNTER — Encounter: Payer: Self-pay | Admitting: Adult Health

## 2023-04-02 ENCOUNTER — Ambulatory Visit: Payer: Managed Care, Other (non HMO) | Admitting: Adult Health

## 2023-04-02 NOTE — Progress Notes (Deleted)
[facility] [address] [place]. [place] [address] [phone]       OFFICE FOLLOW UP NOTE  [name] Date of Birth:  [birth year] Medical Record Number:  [id number]    Primary neurologist: Dr. Vickey Huger Reason for visit: 12/17/2022    SUBJECTIVE:  CHIEF COMPLAINT:  No chief complaint on file.   Follow-up visit:  Prior visit:  Brief HPI:   Kimberly [name] is a 32 y.o. female with PMH of obesity, HTN anemia who was evaluated by Dr. Vickey Huger on 12/17/2022 for evaluation of IIH.  Workup completed at [facility] ED due to c/o headaches and diplopia with CTH showing partially empty sella and LP with opening pressure of 45.  She was placed on Diamox but stopped after 3 doses due to skin breakout and hives.    At initial visit with Dr. Vickey Huger, she was started on topiramate 50 mg BID and recommended MRI brain pituitary study and order placed for pituitary labs d/t c/o morning sickness, polyuria, diplopia and galactorrhea and to rule out pituitary apoplexy.  Also recommended completion of HST to rule out underlying sleep disorder.  MRI brain completed through [facility], noted partially empty sella otherwise unremarkable without acute intercranial abnormality HST showed severe sleep apnea with total AHI of 31.7/h accentuated during REM sleep at 56/h     Interval history:            ROS:   14 system review of systems performed and negative with exception of ***  PMH:  Past Medical History:  Diagnosis Date   Anemia    Back pain    Hypertension    Multiple food allergies    Obesity    Ovarian cyst    left side   Pneumonia    Pregnancy induced hypertension    Pregnant     PSH:  Past Surgical History:  Procedure Laterality Date   CESAREAN SECTION  04/09/2011   Procedure: CESAREAN SECTION;  Surgeon: Loney Laurence;  Location: [facility];  Service: Gynecology;  Laterality: N/A;   CESAREAN SECTION N/A 07/21/2014   Procedure: CESAREAN SECTION;   Surgeon: Freddrick March. Tenny Craw, MD;  Location: [facility];  Service: Obstetrics;  Laterality: N/A;   DILATION AND CURETTAGE OF UTERUS     DILITATION & CURRETTAGE/HYSTROSCOPY WITH NOVASURE ABLATION N/A 03/31/2019   Procedure: DILATATION & CURETTAGE/HYSTEROSCOPY WITH NOVASURE ABLATION;  Surgeon: Carrington Clamp, MD;  Location: [facility];  Service: Gynecology;  Laterality: N/A;   KNEE ARTHROSCOPY     TONSILLECTOMY     TUBAL LIGATION     WISDOM TOOTH EXTRACTION      Social History:  Social History   Socioeconomic History   Marital status: Single    Spouse name: Not on file   Number of children: Not on file   Years of education: Not on file   Highest education level: Not on file  Occupational History   Not on file  Tobacco Use   Smoking status: Never   Smokeless tobacco: Never  Vaping Use   Vaping status: Never Used  Substance and Sexual Activity   Alcohol use: No   Drug use: No   Sexual activity: Yes    Birth control/protection: Surgical  Other Topics Concern   Not on file  Social History Narrative   Not on file   Social Determinants of Health   Financial Resource Strain: Not on file  Food Insecurity: Not on file  Transportation Needs: Not on file  Physical Activity: Not  on file  Stress: Not on file  Social Connections: Unknown (12/17/2022)   [facility]   Social Network    Social Network: Not on file  Intimate Partner Violence: Unknown (12/17/2022)   Received from [facility]   HITS    Physically Hurt: Not on file    Insult or Talk Down To: Not on file    Threaten Physical Harm: Not on file    Scream or Curse: Not on file    Family History:  Family History  Problem Relation Age of Onset   Diabetes Mother    Diabetes Maternal Grandmother    Diabetes Maternal Grandfather    Diabetes Paternal Grandmother    Stroke Paternal Grandmother     Medications:   Current Outpatient Medications on File Prior to Visit  Medication Sig Dispense Refill   cabergoline  (DOSTINEX) 0.5 MG tablet Take 0.5 tablets (0.25 mg total) by mouth 2 (two) times a week. 12 tablet 1   ferrous sulfate 325 (65 FE) MG tablet Take 325 mg by mouth daily.     hydrocortisone (ANUSOL-HC) 2.5 % rectal cream Place 1 Application rectally 2 (two) times daily. 30 g 0   ketorolac (TORADOL) 10 MG tablet Take 1 tablet (10 mg total) by mouth every 6 (six) hours as needed. 20 tablet 0   methocarbamol (ROBAXIN) 500 MG tablet Take 1 tablet (500 mg total) by mouth every 8 (eight) hours as needed for muscle spasms. 8 tablet 0   methocarbamol (ROBAXIN) 500 MG tablet Take 1 tablet (500 mg total) by mouth 2 (two) times daily. 20 tablet 0   metroNIDAZOLE (FLAGYL) 500 MG tablet Take 1 tablet (500 mg total) by mouth 2 (two) times daily. One po bid x 7 days 14 tablet 0   predniSONE (DELTASONE) 10 MG tablet Take 2 tablets (20 mg total) by mouth daily. 10 tablet 0   topiramate (TOPAMAX) 25 MG tablet Take 2 tablets (50 mg total) by mouth 2 (two) times daily. 120 tablet 3   Vitamin D, Ergocalciferol, (DRISDOL) 1.25 MG (50000 UNIT) CAPS capsule Take 1 capsule (50,000 Units total) by mouth every 7 (seven) days. 4 capsule 0   No current facility-administered medications on file prior to visit.    Allergies:   Allergies  Allergen Reactions   Lemon Juice Itching and Other (See Comments)    Tongue swelling   Orange Juice Itching and Other (See Comments)    Tongue swelling   Citrus Itching and Swelling    Tongue swelling   Penicillins Itching    DID THE REACTION INVOLVE: Swelling of the face/tongue/throat, SOB, or low BP? No Sudden or severe rash/hives, skin peeling, or the inside of the mouth or nose? No Did it require medical treatment? No When did it last happen?       If all above answers are "NO", may proceed with cephalosporin use.    Sulfa Antibiotics Itching   Amoxicillin Rash   Latex Itching and Rash      OBJECTIVE:  Physical Exam  There were no vitals filed for this visit. There is  no height or weight on file to calculate BMI. No results found.     11/10/2019   11:10 AM  Depression screen PHQ 2/9  Decreased Interest 3  Down, Depressed, Hopeless 1  PHQ - 2 Score 4  Altered sleeping 1  Tired, decreased energy 3  Change in appetite 2  Feeling bad or failure about yourself  1  Trouble concentrating  1  Moving slowly or fidgety/restless 1  Suicidal thoughts 0  PHQ-9 Score 13  Difficult doing work/chores Somewhat difficult     General: well developed, well nourished, seated, in no evident distress Head: head normocephalic and atraumatic.   Neck: supple with no carotid or supraclavicular bruits Cardiovascular: regular rate and rhythm, no murmurs Musculoskeletal: no deformity Skin:  no rash/petichiae Vascular:  Normal pulses all extremities   Neurologic Exam Mental Status: Awake and fully alert. Oriented to place and time. Recent and remote memory intact. Attention span, concentration and fund of knowledge appropriate. Mood and affect appropriate.  Cranial Nerves: Pupils equal, briskly reactive to light. Extraocular movements full without nystagmus. Visual fields full to confrontation. Hearing intact. Facial sensation intact. Face, tongue, palate moves normally and symmetrically.  Motor: Normal bulk and tone. Normal strength in all tested extremity muscles Sensory.: intact to touch , pinprick , position and vibratory sensation.  Coordination: Rapid alternating movements normal in all extremities. Finger-to-nose and heel-to-shin performed accurately bilaterally. Gait and Station: Arises from chair without difficulty. Stance is normal. Gait demonstrates normal stride length and balance with ***. Tandem walk and heel toe ***.  Reflexes: 1+ and symmetric. Toes downgoing.         ASSESSMENT/PLAN: Kimberly [name] is a 32 y.o. year old female      *** :       Follow up in *** or call earlier if needed   CC:  PCP: Sarita Haver, NP-C    I spent  *** minutes of face-to-face and non-face-to-face time with patient.  This included previsit chart review, lab review, study review, order entry, electronic health record documentation, patient education and discussion regarding above diagnoses and treatment plan and answered all other questions to patient's satisfaction    Ihor Austin, [facility]  [facility] [address] [place], [place] [address]  Phone [phone] Fax [phone] Note: This document was prepared with digital dictation and possible smart phrase technology. Any transcriptional errors that result from this process are unintentional.

## 2023-04-12 ENCOUNTER — Emergency Department (HOSPITAL_BASED_OUTPATIENT_CLINIC_OR_DEPARTMENT_OTHER): Payer: Managed Care, Other (non HMO)

## 2023-04-12 ENCOUNTER — Encounter (HOSPITAL_BASED_OUTPATIENT_CLINIC_OR_DEPARTMENT_OTHER): Payer: Self-pay | Admitting: Emergency Medicine

## 2023-04-12 DIAGNOSIS — I10 Essential (primary) hypertension: Secondary | ICD-10-CM | POA: Insufficient documentation

## 2023-04-12 DIAGNOSIS — Z91040 Latex allergy status: Secondary | ICD-10-CM | POA: Diagnosis not present

## 2023-04-12 DIAGNOSIS — R0789 Other chest pain: Secondary | ICD-10-CM | POA: Insufficient documentation

## 2023-04-12 DIAGNOSIS — R079 Chest pain, unspecified: Secondary | ICD-10-CM | POA: Diagnosis present

## 2023-04-12 LAB — BASIC METABOLIC PANEL
Anion gap: 10 (ref 5–15)
BUN: 12 mg/dL (ref 6–20)
CO2: 24 mmol/L (ref 22–32)
Calcium: 9.5 mg/dL (ref 8.9–10.3)
Chloride: 103 mmol/L (ref 98–111)
Creatinine, Ser: 0.76 mg/dL (ref 0.44–1.00)
GFR, Estimated: >60 mL/min (ref >60)
Glucose, Bld: 121 mg/dL — ABNORMAL HIGH (ref 70–99)
Potassium: 3.5 mmol/L (ref 3.5–5.1)
Sodium: 137 mmol/L (ref 135–145)

## 2023-04-12 LAB — CBC
HCT: 35.2 % — ABNORMAL LOW (ref 36.0–46.0)
Hemoglobin: 10.7 g/dL — ABNORMAL LOW (ref 12.0–15.0)
MCH: 21.9 pg — ABNORMAL LOW (ref 26.0–34.0)
MCHC: 30.4 g/dL (ref 30.0–36.0)
MCV: 72 fL — ABNORMAL LOW (ref 80.0–100.0)
Platelets: 317 10*3/uL (ref 150–400)
RBC: 4.89 MIL/uL (ref 3.87–5.11)
RDW: 14.6 % (ref 11.5–15.5)
WBC: 8.8 10*3/uL (ref 4.0–10.5)
nRBC: 0 % (ref 0.0–0.2)

## 2023-04-12 LAB — TROPONIN I (HIGH SENSITIVITY): Troponin I (High Sensitivity): 3 ng/L (ref <18)

## 2023-04-12 LAB — PREGNANCY, URINE: Preg Test, Ur: NEGATIVE

## 2023-04-12 NOTE — ED Triage Notes (Signed)
Chest pain started yesterday, right arm pain. Worse pain when raising arm. Denies injury Painful to swallow

## 2023-04-13 ENCOUNTER — Emergency Department (HOSPITAL_BASED_OUTPATIENT_CLINIC_OR_DEPARTMENT_OTHER)
Admission: EM | Admit: 2023-04-13 | Discharge: 2023-04-13 | Disposition: A | Discharge status: Home / Self Care | Payer: Managed Care, Other (non HMO) | Attending: Emergency Medicine | Admitting: Emergency Medicine

## 2023-04-13 ENCOUNTER — Emergency Department (HOSPITAL_BASED_OUTPATIENT_CLINIC_OR_DEPARTMENT_OTHER): Payer: Managed Care, Other (non HMO)

## 2023-04-13 DIAGNOSIS — R0789 Other chest pain: Secondary | ICD-10-CM

## 2023-04-13 LAB — TROPONIN I (HIGH SENSITIVITY): Troponin I (High Sensitivity): 3 ng/L (ref <18)

## 2023-04-13 LAB — D-DIMER, QUANTITATIVE: D-Dimer, Quant: 0.55 ug{FEU}/mL — ABNORMAL HIGH (ref 0.00–0.50)

## 2023-04-13 MED ORDER — IOHEXOL 350 MG/ML SOLN
100.0000 mL | Freq: Once | INTRAVENOUS | Status: AC | PRN
  Administered 2023-04-13: 75 mL via INTRAVENOUS

## 2023-04-13 MED ORDER — PANTOPRAZOLE SODIUM 40 MG PO TBEC: 40.0000 mg | DELAYED_RELEASE_TABLET | Freq: Every day | ORAL | 0 refills | Status: AC

## 2023-04-13 MED ORDER — ALUM & MAG HYDROXIDE-SIMETH 200-200-20 MG/5ML PO SUSP
30.0000 mL | Freq: Once | ORAL | Status: AC
  Administered 2023-04-13: 30 mL via ORAL

## 2023-04-13 NOTE — ED Provider Notes (Signed)
[facility] EMERGENCY DEPARTMENT AT [facility]  Provider Note  CSN: [id number] Arrival date & time: 04/12/23 2139  History Chief Complaint  Patient presents with   Chest Pain    Kimberly [name] is a 32 y.o. female with history of IIH, HTN, reports sharp chest pains, radiating into R shoulder, worse with eating. She also drives a transport bus and was sitting for prolonged periods the last 2 days. No leg swelling. Took some tums without much relief.    Home Medications Prior to Admission medications   Medication Sig Start Date End Date Taking? Authorizing Provider  pantoprazole (PROTONIX) 40 MG tablet Take 1 tablet (40 mg total) by mouth daily. 04/13/23  Yes Kimberly Savoy, MD  cabergoline (DOSTINEX) 0.5 MG tablet Take 0.5 tablets (0.25 mg total) by mouth 2 (two) times a week. 01/13/20   Shamleffer, Konrad Dolores, MD  ferrous sulfate 325 (65 FE) MG tablet Take 325 mg by mouth daily.    [provider]  hydrocortisone (ANUSOL-HC) 2.5 % rectal cream Place 1 Application rectally 2 (two) times daily. 04/05/22   Kimberly Lyons, MD  ketorolac (TORADOL) 10 MG tablet Take 1 tablet (10 mg total) by mouth every 6 (six) hours as needed. 08/30/22   Kimberly Malling, PA  methocarbamol (ROBAXIN) 500 MG tablet Take 1 tablet (500 mg total) by mouth every 8 (eight) hours as needed for muscle spasms. 05/17/21   Kimberly Core, MD  methocarbamol (ROBAXIN) 500 MG tablet Take 1 tablet (500 mg total) by mouth 2 (two) times daily. 08/30/22   Kimberly Malling, PA  metroNIDAZOLE (FLAGYL) 500 MG tablet Take 1 tablet (500 mg total) by mouth 2 (two) times daily. One po bid x 7 days 04/05/22   Kimberly Lyons, MD  predniSONE (DELTASONE) 10 MG tablet Take 2 tablets (20 mg total) by mouth daily. 12/15/22   Kimberly Lek, FNP  topiramate (TOPAMAX) 25 MG tablet Take 2 tablets (50 mg total) by mouth 2 (two) times daily. 12/17/22   Dohmeier, Porfirio Mylar, MD  Vitamin D, Ergocalciferol, (DRISDOL) 1.25 MG (50000 UNIT) CAPS  capsule Take 1 capsule (50,000 Units total) by mouth every 7 (seven) days. 12/07/19   Kimberly Reusing, MD     Allergies    Lemon juice, Orange juice, Citrus, Penicillins, Sulfa antibiotics, Amoxicillin, and Latex   Review of Systems   Review of Systems Please see HPI for pertinent positives and negatives  Physical Exam BP 131/61   Pulse 72   Temp 98.2 F (36.8 C)   Resp 19   SpO2 100%   Physical Exam Vitals and nursing note reviewed.  Constitutional:      Appearance: Normal appearance.  HENT:     Head: Normocephalic and atraumatic.     Nose: Nose normal.     Mouth/Throat:     Mouth: Mucous membranes are moist.  Eyes:     Extraocular Movements: Extraocular movements intact.     Conjunctiva/sclera: Conjunctivae normal.  Cardiovascular:     Rate and Rhythm: Normal rate.  Pulmonary:     Effort: Pulmonary effort is normal.     Breath sounds: Normal breath sounds.  Chest:     Chest wall: Tenderness (mid-sternal) present.  Abdominal:     General: Abdomen is flat.     Palpations: Abdomen is soft.     Tenderness: There is no abdominal tenderness.  Musculoskeletal:        General: No swelling. Normal range of motion.     Cervical back:  Neck supple.  Skin:    General: Skin is warm and dry.  Neurological:     General: No focal deficit present.     Mental Status: She is alert.  Psychiatric:        Mood and Affect: Mood normal.     ED Results / Procedures / Treatments   EKG EKG Interpretation Date/Time:  Saturday April 12 2023 21:52:21 EDT Ventricular Rate:  80 PR Interval:  150 QRS Duration:  78 QT Interval:  376 QTC Calculation: 433 R Axis:   56  Text Interpretation: Normal sinus rhythm with sinus arrhythmia Normal ECG When compared with ECG of 21-Jan-2022 22:44, PREVIOUS ECG IS PRESENT No significant change since last tracing Confirmed by Kimberly [name] [phone]) on 04/13/2023 12:55:04 AM  Procedures Procedures  Medications Ordered in the  ED Medications  alum & mag hydroxide-simeth (MAALOX/MYLANTA) 200-200-20 MG/5ML suspension 30 mL (30 mLs Oral Given 04/13/23 0121)  iohexol (OMNIPAQUE) 350 MG/ML injection 100 mL (75 mLs Intravenous Contrast Given 04/13/23 0159)    Initial Impression and Plan  Patient here with atypical chest pain, some recent prolonged sitting while driving bus concerning for possible PE. Vitals are reassuring. Labs done in triage show CBC with anemia at baseline. BMP and Trop are neg. Will add dimer and delta trop. I personally viewed the images from radiology studies and agree with radiologist interpretation: CXR clear, hilar vessels not likely to be causing pain, will need outpatient follow up.   ED Course   Clinical Course as of 04/13/23 3762  [name] Apr 13, 2023  0134 Dimer is elevated, will send for CTA to eval PE.  [CS]  0151 Delta trop remains normal.  [CS]  0308 I personally viewed the images from radiology studies and agree with radiologist interpretation:  CTA does not show large/central PE, but suboptimal opacification. Doubt there is a clinically significant PE given normal vitals. Plan discharge with Rx for PPI for possible GERD, does not appear to be a cardiac etiology. Recommend PCP follow up, RTED for any other concerns.   [CS]    Clinical Course User Index [CS] Kimberly Savoy, MD     MDM Rules/Calculators/A&P Medical Decision Making Given presenting complaint, I considered that admission might be necessary. After review of results from ED lab and/or imaging studies, admission to the hospital is not indicated at this time.    Problems Addressed: Atypical chest pain: acute illness or injury  Amount and/or Complexity of Data Reviewed Labs: ordered. Decision-making details documented in ED Course. Radiology: ordered and independent interpretation performed. Decision-making details documented in ED Course. ECG/medicine tests: ordered and independent interpretation performed.  Decision-making details documented in ED Course.  Risk OTC drugs. Prescription drug management. Decision regarding hospitalization.     Final Clinical Impression(s) / ED Diagnoses Final diagnoses:  Atypical chest pain    Rx / DC Orders ED Discharge Orders          Ordered    pantoprazole (PROTONIX) 40 MG tablet  Daily        04/13/23 0311             Kimberly Savoy, MD 04/13/23 [phone]

## 2023-04-13 NOTE — ED Notes (Signed)
Patient transported to CT 

## 2023-08-24 ENCOUNTER — Other Ambulatory Visit: Payer: Self-pay

## 2023-08-24 ENCOUNTER — Encounter (HOSPITAL_COMMUNITY): Payer: Self-pay

## 2023-08-24 ENCOUNTER — Emergency Department (HOSPITAL_COMMUNITY): Payer: Self-pay

## 2023-08-24 ENCOUNTER — Emergency Department (HOSPITAL_COMMUNITY)
Admission: EM | Admit: 2023-08-24 | Discharge: 2023-08-24 | Disposition: A | Discharge status: Home / Self Care | Payer: Self-pay | Attending: Emergency Medicine | Admitting: Emergency Medicine

## 2023-08-24 DIAGNOSIS — J181 Lobar pneumonia, unspecified organism: Secondary | ICD-10-CM | POA: Insufficient documentation

## 2023-08-24 DIAGNOSIS — Z91040 Latex allergy status: Secondary | ICD-10-CM | POA: Insufficient documentation

## 2023-08-24 DIAGNOSIS — M25532 Pain in left wrist: Secondary | ICD-10-CM | POA: Insufficient documentation

## 2023-08-24 DIAGNOSIS — I10 Essential (primary) hypertension: Secondary | ICD-10-CM | POA: Insufficient documentation

## 2023-08-24 DIAGNOSIS — J189 Pneumonia, unspecified organism: Secondary | ICD-10-CM

## 2023-08-24 LAB — RESP PANEL BY RT-PCR (RSV, FLU A&B, COVID)  RVPGX2
Influenza A by PCR: NEGATIVE
Influenza B by PCR: NEGATIVE
Resp Syncytial Virus by PCR: NEGATIVE
SARS Coronavirus 2 by RT PCR: NEGATIVE

## 2023-08-24 MED ORDER — IBUPROFEN 200 MG PO TABS
400.0000 mg | ORAL_TABLET | Freq: Once | ORAL | Status: AC
  Administered 2023-08-24: 400 mg via ORAL
  Filled 2023-08-24: qty 2

## 2023-08-24 MED ORDER — AZITHROMYCIN 250 MG PO TABS: 250.0000 mg | ORAL_TABLET | Freq: Every day | ORAL | 0 refills | Status: DC

## 2023-08-24 MED ORDER — CEFDINIR 300 MG PO CAPS: 300.0000 mg | ORAL_CAPSULE | Freq: Two times a day (BID) | ORAL | 0 refills | Status: AC

## 2023-08-24 MED ORDER — HYDROCODONE-ACETAMINOPHEN 5-325 MG PO TABS
1.0000 | ORAL_TABLET | Freq: Once | ORAL | Status: AC
  Administered 2023-08-24: 1 via ORAL
  Filled 2023-08-24: qty 1

## 2023-08-24 NOTE — ED Triage Notes (Signed)
 Pt presents to ED from home C/O fever, chills, cough, congestion X 1 week.

## 2023-08-24 NOTE — Discharge Instructions (Signed)
 You were seen for your pneumonia in the emergency department.   At home, please take the antibiotics we gave you.  Take Tylenol and ibuprofen for your wrist pain.    Check your MyChart online for the results of any tests that had not resulted by the time you left the emergency department.   Follow-up with your primary doctor in 2-3 days regarding your visit.  Follow-up with sports medicine about your wrist pain.  Return immediately to the emergency department if you experience any of the following: Difficulty breathing, or any other concerning symptoms.    Thank you for visiting our Emergency Department. It was a pleasure taking care of you today.

## 2023-08-24 NOTE — ED Provider Notes (Signed)
  EMERGENCY DEPARTMENT AT [facility] Provider Note   CSN: [id number] Arrival date & time: 08/24/23  1230     History  Chief Complaint  Patient presents with   Cough    Kimberly [name] is a 33 y.o. female.  33 year old female with a history of hypertension who presents to the emergency department with cough, congestion, and fever.  For the past week has been having enough symptoms.  Has not actually taken her temperature presents subjective fevers.  Daughter was diagnosed with flu and pneumonia.  Has been having chest pain with coughing as well.  Is having some left wrist pain and swelling for the past 2 days also.  No trauma.  No injuries.  No history of gout. No other preceding illnesses.        Home Medications Prior to Admission medications   Medication Sig Start Date End Date Taking? Authorizing Provider  azithromycin (ZITHROMAX) 250 MG tablet Take 1 tablet (250 mg total) by mouth daily. Take first 2 tablets together, then 1 every day until finished. 08/24/23  Yes Rondel Baton, MD  cefdinir (OMNICEF) 300 MG capsule Take 1 capsule (300 mg total) by mouth 2 (two) times daily for 7 days. 08/24/23 08/31/23 Yes Rondel Baton, MD  cabergoline (DOSTINEX) 0.5 MG tablet Take 0.5 tablets (0.25 mg total) by mouth 2 (two) times a week. 01/13/20   Shamleffer, Konrad Dolores, MD  ferrous sulfate 325 (65 FE) MG tablet Take 325 mg by mouth daily.    [provider]  hydrocortisone (ANUSOL-HC) 2.5 % rectal cream Place 1 Application rectally 2 (two) times daily. 04/05/22   Geoffery Lyons, MD  ketorolac (TORADOL) 10 MG tablet Take 1 tablet (10 mg total) by mouth every 6 (six) hours as needed. 08/30/22   Jeanelle Malling, PA  methocarbamol (ROBAXIN) 500 MG tablet Take 1 tablet (500 mg total) by mouth every 8 (eight) hours as needed for muscle spasms. 05/17/21   Benjiman Core, MD  methocarbamol (ROBAXIN) 500 MG tablet Take 1 tablet (500 mg total) by mouth 2 (two) times  daily. 08/30/22   Jeanelle Malling, PA  metroNIDAZOLE (FLAGYL) 500 MG tablet Take 1 tablet (500 mg total) by mouth 2 (two) times daily. One po bid x 7 days 04/05/22   Geoffery Lyons, MD  pantoprazole (PROTONIX) 40 MG tablet Take 1 tablet (40 mg total) by mouth daily. 04/13/23   Pollyann Savoy, MD  predniSONE (DELTASONE) 10 MG tablet Take 2 tablets (20 mg total) by mouth daily. 12/15/22   Delorse Lek, FNP  topiramate (TOPAMAX) 25 MG tablet Take 2 tablets (50 mg total) by mouth 2 (two) times daily. 12/17/22   Dohmeier, Porfirio Mylar, MD  Vitamin D, Ergocalciferol, (DRISDOL) 1.25 MG (50000 UNIT) CAPS capsule Take 1 capsule (50,000 Units total) by mouth every 7 (seven) days. 12/07/19   Langston Reusing, MD      Allergies    Lemon juice, Orange juice, Citrus, Penicillins, Sulfa antibiotics, Amoxicillin, and Latex    Review of Systems   Review of Systems  Physical Exam Updated Vital Signs BP (!) 140/90 (BP Location: Right Arm)   Pulse 97   Temp 98.2 F (36.8 C) (Oral)   Resp 20   Ht 5\' 8"  (1.727 m)   Wt (!) 181.4 kg   SpO2 96%   BMI 60.82 kg/m  Physical Exam Vitals and nursing note reviewed.  Constitutional:      General: She is not in acute distress.  Appearance: She is well-developed.  HENT:     Head: Normocephalic and atraumatic.     Right Ear: External ear normal.     Left Ear: External ear normal.     Nose: Congestion present.  Eyes:     Extraocular Movements: Extraocular movements intact.     Conjunctiva/sclera: Conjunctivae normal.     Pupils: Pupils are equal, round, and reactive to light.  Cardiovascular:     Rate and Rhythm: Normal rate and regular rhythm.     Heart sounds: No murmur heard. Pulmonary:     Effort: Pulmonary effort is normal. No respiratory distress.     Breath sounds: Rhonchi (Right base) present.  Musculoskeletal:     Cervical back: Normal range of motion and neck supple.     Right lower leg: No edema.     Left lower leg: No edema.     Comments: Effusion  noted to left wrist.  Full range of motion of the left wrist.  No significant erythema or warmth.  Skin:    General: Skin is warm and dry.  Neurological:     Mental Status: She is alert and oriented to person, place, and time. Mental status is at baseline.  Psychiatric:        Mood and Affect: Mood normal.     ED Results / Procedures / Treatments   Labs (all labs ordered are listed, but only abnormal results are displayed) Labs Reviewed  RESP PANEL BY RT-PCR (RSV, FLU A&B, COVID)  RVPGX2  PREGNANCY, URINE    EKG None  Radiology DG Wrist Complete Left Result Date: 08/24/2023 CLINICAL DATA:  L wrist pain EXAM: LEFT WRIST - COMPLETE 3+ VIEW COMPARISON:  None Available. FINDINGS: No acute fracture or dislocation. Joint spaces and alignment are maintained. No area of erosion or osseous destruction. No unexpected radiopaque foreign body. Soft tissues are unremarkable. IMPRESSION: No acute fracture or dislocation. Electronically Signed   By: Meda Klinefelter M.D.   On: 08/24/2023 13:37   DG Chest 2 View Result Date: 08/24/2023 CLINICAL DATA:  Fever, cough EXAM: CHEST - 2 VIEW COMPARISON:  April 10, 2023 FINDINGS: The cardiomediastinal silhouette is normal in contour. No pleural effusion. No pneumothorax. There is a RIGHT lower lobe fluffy airspace opacity. Visualized abdomen is unremarkable. No acute osseous abnormality noted. IMPRESSION: RIGHT lower lobe airspace opacity concerning for pneumonia. Recommend follow-up PA and lateral chest radiograph in 4-6 weeks to ensure resolution. Electronically Signed   By: Meda Klinefelter M.D.   On: 08/24/2023 13:35    Procedures Procedures    Medications Ordered in ED Medications  HYDROcodone-acetaminophen (NORCO/VICODIN) 5-325 MG per tablet 1 tablet (1 tablet Oral Given 08/24/23 1310)  ibuprofen (ADVIL) tablet 400 mg (400 mg Oral Given 08/24/23 1311)    ED Course/ Medical Decision Making/ A&P Clinical Course as of 08/24/23 1808  [name] Aug 24, 2023  1342 Patient refused urine hCG [RP]    Clinical Course User Index [RP] Rondel Baton, MD                                 Medical Decision Making Amount and/or Complexity of Data Reviewed Labs: ordered. Radiology: ordered.  Risk OTC drugs. Prescription drug management.   Kimberly [name] is a 33 y.o. female with comorbidities that complicate the patient evaluation including hypertension who presents emergency department with cough, congestion, and fever  Initial Ddx:  URI, pneumonia, asthma/COPD,  reactive arthritis, septic arthritis, fracture  MDM/Course:   Up patient resents emergency department with URI type symptoms.  Does have a daughter that sick with a URI and pneumonia.  On exam does have some rhonchi in her right lower lobe.  She had a chest x-ray which did confirm that she has right lower lobe pneumonia.  Was started on oral antibiotics and we will treat her as an outpatient.  With her wrist swelling unclear exactly what would be causing her hand swelling and pain.  She does have full range of motion of the wrist still so septic arthritis highly unlikely.  May potentially have a reactive arthritis.  X-rays obtained.  Evidence of fracture.  On re-evaluation patient feeling better after the Norco and ibuprofen.  Will have her follow-up with her primary doctor in several days.  Given a work note  This patient presents to the ED for concern of complaints listed in HPI, this involves an extensive number of treatment options, and is a complaint that carries with it a high risk of complications and morbidity. Disposition including potential need for admission considered.   Dispo: DC Home. Return precautions discussed including, but not limited to, those listed in the AVS. Allowed pt time to ask questions which were answered fully prior to dc.  Records reviewed Outpatient Clinic Notes I independently reviewed the following imaging with scope of interpretation limited to  determining acute life threatening conditions related to emergency care: Chest x-ray and agree with the radiologist interpretation with the following exceptions: none I personally reviewed and interpreted cardiac monitoring: normal sinus rhythm  I have reviewed the patients home medications and made adjustments as needed  Portions of this note were generated with Dragon dictation software. Dictation errors may occur despite best attempts at proofreading.     Final Clinical Impression(s) / ED Diagnoses Final diagnoses:  Pneumonia of right lower lobe due to infectious organism  Left wrist pain    Rx / DC Orders ED Discharge Orders          Ordered    cefdinir (OMNICEF) 300 MG capsule  2 times daily        08/24/23 1415    azithromycin (ZITHROMAX) 250 MG tablet  Daily        08/24/23 1415              Rondel Baton, MD 08/24/23 [phone]

## 2023-08-27 ENCOUNTER — Other Ambulatory Visit (HOSPITAL_COMMUNITY): Payer: Self-pay

## 2023-12-13 ENCOUNTER — Other Ambulatory Visit: Payer: Self-pay

## 2023-12-13 ENCOUNTER — Emergency Department (HOSPITAL_BASED_OUTPATIENT_CLINIC_OR_DEPARTMENT_OTHER)
Admission: EM | Admit: 2023-12-13 | Discharge: 2023-12-13 | Discharge status: Against Medical Advice | Payer: Self-pay | Attending: Emergency Medicine | Admitting: Emergency Medicine

## 2023-12-13 ENCOUNTER — Encounter (HOSPITAL_BASED_OUTPATIENT_CLINIC_OR_DEPARTMENT_OTHER): Payer: Self-pay | Admitting: Emergency Medicine

## 2023-12-13 DIAGNOSIS — R509 Fever, unspecified: Secondary | ICD-10-CM | POA: Insufficient documentation

## 2023-12-13 DIAGNOSIS — Z5321 Procedure and treatment not carried out due to patient leaving prior to being seen by health care provider: Secondary | ICD-10-CM | POA: Insufficient documentation

## 2023-12-13 DIAGNOSIS — M545 Low back pain, unspecified: Secondary | ICD-10-CM | POA: Insufficient documentation

## 2023-12-13 LAB — CBC
HCT: 36.4 % (ref 36.0–46.0)
Hemoglobin: 11.1 g/dL — ABNORMAL LOW (ref 12.0–15.0)
MCH: 22.5 pg — ABNORMAL LOW (ref 26.0–34.0)
MCHC: 30.5 g/dL (ref 30.0–36.0)
MCV: 73.7 fL — ABNORMAL LOW (ref 80.0–100.0)
Platelets: 316 10*3/uL (ref 150–400)
RBC: 4.94 MIL/uL (ref 3.87–5.11)
RDW: 14.8 % (ref 11.5–15.5)
WBC: 8.5 10*3/uL (ref 4.0–10.5)
nRBC: 0 % (ref 0.0–0.2)

## 2023-12-13 LAB — BASIC METABOLIC PANEL WITH GFR
Anion gap: 9 (ref 5–15)
BUN: 11 mg/dL (ref 6–20)
CO2: 28 mmol/L (ref 22–32)
Calcium: 9.6 mg/dL (ref 8.9–10.3)
Chloride: 104 mmol/L (ref 98–111)
Creatinine, Ser: 0.66 mg/dL (ref 0.44–1.00)
GFR, Estimated: >60 mL/min (ref >60)
Glucose, Bld: 92 mg/dL (ref 70–99)
Potassium: 3.8 mmol/L (ref 3.5–5.1)
Sodium: 140 mmol/L (ref 135–145)

## 2023-12-13 LAB — URINALYSIS, ROUTINE W REFLEX MICROSCOPIC
Bilirubin Urine: NEGATIVE
Glucose, UA: NEGATIVE mg/dL
Hgb urine dipstick: NEGATIVE
Ketones, ur: NEGATIVE mg/dL
Nitrite: NEGATIVE
Protein, ur: NEGATIVE mg/dL
Specific Gravity, Urine: 1.023 (ref 1.005–1.030)
pH: 6 (ref 5.0–8.0)

## 2023-12-13 LAB — PREGNANCY, URINE: Preg Test, Ur: NEGATIVE

## 2023-12-13 NOTE — ED Triage Notes (Signed)
 Lower back pain x 5 days, fever, subjective.

## 2024-01-13 ENCOUNTER — Encounter (HOSPITAL_BASED_OUTPATIENT_CLINIC_OR_DEPARTMENT_OTHER): Payer: Self-pay

## 2024-01-13 ENCOUNTER — Emergency Department (HOSPITAL_BASED_OUTPATIENT_CLINIC_OR_DEPARTMENT_OTHER): Admitting: Radiology

## 2024-01-13 ENCOUNTER — Emergency Department (HOSPITAL_BASED_OUTPATIENT_CLINIC_OR_DEPARTMENT_OTHER)
Admission: EM | Admit: 2024-01-13 | Discharge: 2024-01-14 | Disposition: A | Discharge status: Home / Self Care | Attending: Emergency Medicine | Admitting: Emergency Medicine

## 2024-01-13 ENCOUNTER — Other Ambulatory Visit: Payer: Self-pay

## 2024-01-13 DIAGNOSIS — Z91040 Latex allergy status: Secondary | ICD-10-CM | POA: Diagnosis not present

## 2024-01-13 DIAGNOSIS — R1031 Right lower quadrant pain: Secondary | ICD-10-CM | POA: Diagnosis present

## 2024-01-13 DIAGNOSIS — N200 Calculus of kidney: Secondary | ICD-10-CM | POA: Insufficient documentation

## 2024-01-13 LAB — CBC
HCT: 34.1 % — ABNORMAL LOW (ref 36.0–46.0)
Hemoglobin: 10.6 g/dL — ABNORMAL LOW (ref 12.0–15.0)
MCH: 22.5 pg — ABNORMAL LOW (ref 26.0–34.0)
MCHC: 31.1 g/dL (ref 30.0–36.0)
MCV: 72.4 fL — ABNORMAL LOW (ref 80.0–100.0)
Platelets: 309 K/uL (ref 150–400)
RBC: 4.71 MIL/uL (ref 3.87–5.11)
RDW: 14.8 % (ref 11.5–15.5)
WBC: 10.1 K/uL (ref 4.0–10.5)
nRBC: 0 % (ref 0.0–0.2)

## 2024-01-13 MED ORDER — OXYCODONE-ACETAMINOPHEN 5-325 MG PO TABS
2.0000 | ORAL_TABLET | Freq: Once | ORAL | Status: AC
  Administered 2024-01-14: 2 via ORAL
  Filled 2024-01-13: qty 2

## 2024-01-13 NOTE — ED Notes (Signed)
 Patient transported to X-ray

## 2024-01-13 NOTE — ED Triage Notes (Signed)
 Pt reports concern for right hip pain that radiates into the groin over the past month. Pain worsens with movement, sitting and laying down. Reports difficulty with ambulation. Denies any known injury/trauma. No vaginal discharge. No urinary s/s. Has taken tylenol , last dose yesterday around 5 pm without relief.   Also c/o intermittent chest discomfort and palpitations over the past two days. Feels like I'm running Associated with nausea.

## 2024-01-14 ENCOUNTER — Emergency Department (HOSPITAL_BASED_OUTPATIENT_CLINIC_OR_DEPARTMENT_OTHER)

## 2024-01-14 LAB — PREGNANCY, URINE: Preg Test, Ur: NEGATIVE

## 2024-01-14 LAB — BASIC METABOLIC PANEL WITH GFR
Anion gap: 12 (ref 5–15)
BUN: 11 mg/dL (ref 6–20)
CO2: 24 mmol/L (ref 22–32)
Calcium: 9.3 mg/dL (ref 8.9–10.3)
Chloride: 103 mmol/L (ref 98–111)
Creatinine, Ser: 0.72 mg/dL (ref 0.44–1.00)
GFR, Estimated: >60 mL/min (ref >60)
Glucose, Bld: 102 mg/dL — ABNORMAL HIGH (ref 70–99)
Potassium: 3.8 mmol/L (ref 3.5–5.1)
Sodium: 139 mmol/L (ref 135–145)

## 2024-01-14 LAB — HEPATIC FUNCTION PANEL
ALT: 12 U/L (ref 0–44)
AST: 20 U/L (ref 15–41)
Albumin: 4 g/dL (ref 3.5–5.0)
Alkaline Phosphatase: 89 U/L (ref 38–126)
Bilirubin, Direct: 0.1 mg/dL (ref 0.0–0.2)
Indirect Bilirubin: 0.3 mg/dL (ref 0.3–0.9)
Total Bilirubin: 0.4 mg/dL (ref 0.0–1.2)
Total Protein: 7.7 g/dL (ref 6.5–8.1)

## 2024-01-14 LAB — URINALYSIS, ROUTINE W REFLEX MICROSCOPIC
Bilirubin Urine: NEGATIVE
Glucose, UA: NEGATIVE mg/dL
Ketones, ur: NEGATIVE mg/dL
Nitrite: NEGATIVE
Specific Gravity, Urine: 1.029 (ref 1.005–1.030)
pH: 5.5 (ref 5.0–8.0)

## 2024-01-14 LAB — TROPONIN T, HIGH SENSITIVITY: Troponin T High Sensitivity: <15 ng/L (ref <19)

## 2024-01-14 MED ORDER — MELOXICAM 15 MG PO TABS: 15.0000 mg | ORAL_TABLET | Freq: Every day | ORAL | 0 refills | Status: AC

## 2024-01-14 MED ORDER — METHOCARBAMOL 500 MG PO TABS: 500.0000 mg | ORAL_TABLET | Freq: Three times a day (TID) | ORAL | 0 refills | Status: AC | PRN

## 2024-01-14 MED ORDER — CEPHALEXIN 500 MG PO CAPS: 500.0000 mg | ORAL_CAPSULE | Freq: Four times a day (QID) | ORAL | 0 refills | Status: AC

## 2024-01-14 NOTE — ED Provider Notes (Signed)
 [place] EMERGENCY DEPARTMENT AT [facility] Provider Note   CSN: [id number] Arrival date & time: 01/13/24  2239     Patient presents with: Chest Pain and Hip Pain   Kimberly [name] is a 33 y.o. female.   One month of atraumatic right angerior pelvis pain radiating from right lateral hip. Slowly worsening. Not helped with home meds. No fevers. No urinary changes. No gi changes. Not currently on menses. No vagainal or pelvic symptoms.   Chest Pain Hip Pain Associated symptoms include chest pain.       Prior to Admission medications   Medication Sig Start Date End Date Taking? Authorizing Provider  meloxicam  (MOBIC ) 15 MG tablet Take 1 tablet (15 mg total) by mouth daily. 01/14/24  Yes Samona Chihuahua, Selinda, MD  methocarbamol  (ROBAXIN ) 500 MG tablet Take 1 tablet (500 mg total) by mouth every 8 (eight) hours as needed for muscle spasms. 01/14/24  Yes Cinch Ormond, Selinda, MD  cabergoline  (DOSTINEX ) 0.5 MG tablet Take 0.5 tablets (0.25 mg total) by mouth 2 (two) times a week. 01/13/20   Shamleffer, Ibtehal Jaralla, MD  ferrous sulfate  325 (65 FE) MG tablet Take 325 mg by mouth daily.    [provider]  pantoprazole  (PROTONIX ) 40 MG tablet Take 1 tablet (40 mg total) by mouth daily. 04/13/23   Roselyn Carlin NOVAK, MD  predniSONE  (DELTASONE ) 10 MG tablet Take 2 tablets (20 mg total) by mouth daily. 12/15/22   Blair, Diane W, FNP  topiramate  (TOPAMAX ) 25 MG tablet Take 2 tablets (50 mg total) by mouth 2 (two) times daily. 12/17/22   Dohmeier, Dedra, MD  Vitamin D , Ergocalciferol , (DRISDOL ) 1.25 MG (50000 UNIT) CAPS capsule Take 1 capsule (50,000 Units total) by mouth every 7 (seven) days. 12/07/19   [place] Adelita PENNER, MD    Allergies: Lemon juice, Orange juice, Citrus, Penicillins, Sulfa antibiotics, Amoxicillin, and Latex    Review of Systems  Cardiovascular:  Positive for chest pain.    Updated Vital Signs BP 125/65   Pulse 71   Temp 98 F (36.7 C)   Resp 19   SpO2 95%    Physical Exam Vitals and nursing note reviewed.  Constitutional:      Appearance: She is well-developed.  HENT:     Head: Normocephalic and atraumatic.  Cardiovascular:     Rate and Rhythm: Normal rate and regular rhythm.  Pulmonary:     Effort: No respiratory distress.     Breath sounds: No stridor.  Abdominal:     General: There is no distension.  Musculoskeletal:     Cervical back: Normal range of motion.  Skin:    Comments: No ttp in right hip/pelvis, no rash, erythema, induration in pannus fold over inguinal ligament.   Neurological:     Mental Status: She is alert.     (all labs ordered are listed, but only abnormal results are displayed) Labs Reviewed  BASIC METABOLIC PANEL WITH GFR - Abnormal; Notable for the following components:      Result Value   Glucose, Bld 102 (*)    All other components within normal limits  CBC - Abnormal; Notable for the following components:   Hemoglobin 10.6 (*)    HCT 34.1 (*)    MCV 72.4 (*)    MCH 22.5 (*)    All other components within normal limits  URINALYSIS, ROUTINE W REFLEX MICROSCOPIC - Abnormal; Notable for the following components:   APPearance HAZY (*)    Hgb urine dipstick LARGE (*)  Protein, ur TRACE (*)    Leukocytes,Ua MODERATE (*)    Bacteria, UA RARE (*)    All other components within normal limits  HEPATIC FUNCTION PANEL  PREGNANCY, URINE  TROPONIN T, HIGH SENSITIVITY    EKG: EKG Interpretation Date/Time:  Tuesday January 13 2024 22:50:05 EDT Ventricular Rate:  81 PR Interval:  154 QRS Duration:  82 QT Interval:  367 QTC Calculation: 426 R Axis:   57  Text Interpretation: Sinus rhythm Confirmed by Lorette Mayo [phone]) on 01/13/2024 11:25:49 PM  Radiology: CT Renal Stone Study Result Date: 01/14/2024 CLINICAL DATA:  Abdominal and flank pain.  Right groin pain EXAM: CT ABDOMEN AND PELVIS WITHOUT CONTRAST TECHNIQUE: Multidetector CT imaging of the abdomen and pelvis was performed following the  standard protocol without IV contrast. RADIATION DOSE REDUCTION: This exam was performed according to the departmental dose-optimization program which includes automated exposure control, adjustment of the mA and/or kV according to patient size and/or use of iterative reconstruction technique. COMPARISON:  None Available. FINDINGS: Lower chest: No acute abnormality. Hepatobiliary: No focal liver abnormality is seen. No gallstones, gallbladder wall thickening, or biliary dilatation. Pancreas: Unremarkable Spleen: Unremarkable Adrenals/Urinary Tract: The adrenal glands are unremarkable. The kidneys are normal in size position. Multiple nonobstructing calculi are seen within the mid and lower pole the left kidney measuring up to 11 mm. Simple cortical cyst noted within the upper pole left kidney for which no follow-up imaging is recommended. No hydronephrosis. No ureteral calculi. Bladder is unremarkable. Stomach/Bowel: Stomach is within normal limits. Appendix appears normal. No evidence of bowel wall thickening, distention, or inflammatory changes. Vascular/Lymphatic: No significant vascular findings are present. No enlarged abdominal or pelvic lymph nodes. Reproductive: Bilateral tubal ligation clips in place. Uterus and bilateral adnexa are otherwise unremarkable. Other: No abdominal wall hernia or abnormality. No abdominopelvic ascites. Musculoskeletal: No acute or significant osseous findings. IMPRESSION: 1. Multiple nonobstructing left renal calculi. No hydronephrosis. No ureteral calculi. Electronically Signed   By: Dorethia Molt M.D.   On: 01/14/2024 03:05   DG Hip Unilat  With Pelvis 2-3 Views Right Result Date: 01/13/2024 CLINICAL DATA:  Right hip pain radiating to the groin for a month. No injury. EXAM: DG HIP (WITH OR WITHOUT PELVIS) 2-3V RIGHT COMPARISON:  12/20/2021 FINDINGS: Mild degenerative changes in the right hip with superior acetabular joint space narrowing and sclerosis and small osteophyte  formation. No evidence of acute fracture or dislocation. No focal bone lesion or bone destruction. SI joints and symphysis pubis are not displaced. Soft tissues are unremarkable. IMPRESSION: Mild degenerative changes demonstrated in the right hip. No acute bony abnormalities. Electronically Signed   By: Elsie Gravely M.D.   On: 01/13/2024 23:24   DG Chest 2 View Result Date: 01/13/2024 CLINICAL DATA:  Chest pain. Right hip pain radiating to the groin for a month. No injury. EXAM: CHEST - 2 VIEW COMPARISON:  08/24/2023 FINDINGS: The heart size and mediastinal contours are within normal limits. Both lungs are clear. The visualized skeletal structures are unremarkable. IMPRESSION: No active cardiopulmonary disease. Electronically Signed   By: Elsie Gravely M.D.   On: 01/13/2024 23:22     Procedures   Medications Ordered in the ED  oxyCODONE -acetaminophen  (PERCOCET/ROXICET) 5-325 MG per tablet 2 tablet (2 tablets Oral Given 01/14/24 0003)                                    Medical Decision  Making Amount and/or Complexity of Data Reviewed Labs: ordered. Radiology: ordered.  Risk Prescription drug management.   No e/o infection. No e/o fracture or ureterolithiasis. Will tx for muscle strain. Not on period but some signs of UA infection so will treat for that pending culture.      Final diagnoses:  Right groin pain    ED Discharge Orders          Ordered    meloxicam  (MOBIC ) 15 MG tablet  Daily        01/14/24 0342    methocarbamol  (ROBAXIN ) 500 MG tablet  Every 8 hours PRN        01/14/24 0342               Nura Cahoon, MD 01/14/24 [phone]

## 2024-03-28 ENCOUNTER — Emergency Department (HOSPITAL_BASED_OUTPATIENT_CLINIC_OR_DEPARTMENT_OTHER): Admitting: Radiology

## 2024-03-28 ENCOUNTER — Emergency Department (HOSPITAL_BASED_OUTPATIENT_CLINIC_OR_DEPARTMENT_OTHER)
Admission: EM | Admit: 2024-03-28 | Discharge: 2024-03-28 | Disposition: A | Discharge status: Home / Self Care | Attending: Emergency Medicine | Admitting: Emergency Medicine

## 2024-03-28 ENCOUNTER — Other Ambulatory Visit: Payer: Self-pay

## 2024-03-28 DIAGNOSIS — J069 Acute upper respiratory infection, unspecified: Secondary | ICD-10-CM | POA: Insufficient documentation

## 2024-03-28 DIAGNOSIS — Z91040 Latex allergy status: Secondary | ICD-10-CM | POA: Insufficient documentation

## 2024-03-28 DIAGNOSIS — R059 Cough, unspecified: Secondary | ICD-10-CM | POA: Diagnosis present

## 2024-03-28 LAB — RESP PANEL BY RT-PCR (RSV, FLU A&B, COVID)  RVPGX2
Influenza A by PCR: NEGATIVE
Influenza B by PCR: NEGATIVE
Resp Syncytial Virus by PCR: NEGATIVE
SARS Coronavirus 2 by RT PCR: NEGATIVE

## 2024-03-28 LAB — BASIC METABOLIC PANEL WITH GFR
Anion gap: 12 (ref 5–15)
BUN: 10 mg/dL (ref 6–20)
CO2: 24 mmol/L (ref 22–32)
Calcium: 9.2 mg/dL (ref 8.9–10.3)
Chloride: 102 mmol/L (ref 98–111)
Creatinine, Ser: 0.76 mg/dL (ref 0.44–1.00)
GFR, Estimated: >60 mL/min (ref >60)
Glucose, Bld: 138 mg/dL — ABNORMAL HIGH (ref 70–99)
Potassium: 4 mmol/L (ref 3.5–5.1)
Sodium: 138 mmol/L (ref 135–145)

## 2024-03-28 LAB — CBC
HCT: 32.5 % — ABNORMAL LOW (ref 36.0–46.0)
Hemoglobin: 10 g/dL — ABNORMAL LOW (ref 12.0–15.0)
MCH: 22 pg — ABNORMAL LOW (ref 26.0–34.0)
MCHC: 30.8 g/dL (ref 30.0–36.0)
MCV: 71.6 fL — ABNORMAL LOW (ref 80.0–100.0)
Platelets: 337 K/uL (ref 150–400)
RBC: 4.54 MIL/uL (ref 3.87–5.11)
RDW: 14.7 % (ref 11.5–15.5)
WBC: 11.8 K/uL — ABNORMAL HIGH (ref 4.0–10.5)
nRBC: 0 % (ref 0.0–0.2)

## 2024-03-28 LAB — TROPONIN T, HIGH SENSITIVITY: Troponin T High Sensitivity: <15 ng/L (ref 0–19)

## 2024-03-28 MED ORDER — IPRATROPIUM-ALBUTEROL 0.5-2.5 (3) MG/3ML IN SOLN
3.0000 mL | Freq: Once | RESPIRATORY_TRACT | Status: AC
  Administered 2024-03-28: 3 mL via RESPIRATORY_TRACT
  Filled 2024-03-28: qty 3

## 2024-03-28 MED ORDER — AZITHROMYCIN 250 MG PO TABS: 250.0000 mg | ORAL_TABLET | Freq: Every day | ORAL | 0 refills | Status: AC

## 2024-03-28 MED ORDER — DEXAMETHASONE 4 MG PO TABS
10.0000 mg | ORAL_TABLET | Freq: Once | ORAL | Status: AC
  Administered 2024-03-28: 10 mg via ORAL
  Filled 2024-03-28: qty 3

## 2024-03-28 NOTE — ED Triage Notes (Signed)
 Pt states that she has had midsternal chest pain x 4 days. Pt describes pain as constant and sharp. Pt has also had cough, congestion, body aches, and sore throat x 6 days.

## 2024-03-28 NOTE — ED Provider Notes (Signed)
 [place] EMERGENCY DEPARTMENT AT [facility] Provider Note   CSN: [id number] Arrival date & time: 03/28/24  2017     Patient presents with: Chest Pain   Kimberly [name] is a 33 y.o. female.   33 yo F with a chief complaint of cough congestion going on for about 5 or 6 days.  She also has been having some chest pains worse with coughing going on for about 3 or 4 days.  Right-sided pinpoint.  Says its almost underneath the right breast.  Has trouble breathing at times and does not really feel like eating and drinking.  She does not feel like she is getting any better.  No known sick contacts.   Chest Pain      Prior to Admission medications   Medication Sig Start Date End Date Taking? Authorizing Provider  azithromycin  (ZITHROMAX ) 250 MG tablet Take 1 tablet (250 mg total) by mouth daily. Take first 2 tablets together, then 1 every day until finished. 03/28/24  Yes Emil Share, DO  cabergoline  (DOSTINEX ) 0.5 MG tablet Take 0.5 tablets (0.25 mg total) by mouth 2 (two) times a week. 01/13/20   Shamleffer, Ibtehal Jaralla, MD  cephALEXin  (KEFLEX ) 500 MG capsule Take 1 capsule (500 mg total) by mouth 4 (four) times daily. 01/14/24   Mesner, Selinda, MD  ferrous sulfate  325 (65 FE) MG tablet Take 325 mg by mouth daily.    [provider]  meloxicam  (MOBIC ) 15 MG tablet Take 1 tablet (15 mg total) by mouth daily. 01/14/24   Mesner, Selinda, MD  methocarbamol  (ROBAXIN ) 500 MG tablet Take 1 tablet (500 mg total) by mouth every 8 (eight) hours as needed for muscle spasms. 01/14/24   Mesner, Selinda, MD  pantoprazole  (PROTONIX ) 40 MG tablet Take 1 tablet (40 mg total) by mouth daily. 04/13/23   Roselyn Carlin NOVAK, MD  predniSONE  (DELTASONE ) 10 MG tablet Take 2 tablets (20 mg total) by mouth daily. 12/15/22   Blair, Diane W, FNP  topiramate  (TOPAMAX ) 25 MG tablet Take 2 tablets (50 mg total) by mouth 2 (two) times daily. 12/17/22   Dohmeier, Dedra, MD  Vitamin D , Ergocalciferol ,  (DRISDOL ) 1.25 MG (50000 UNIT) CAPS capsule Take 1 capsule (50,000 Units total) by mouth every 7 (seven) days. 12/07/19   [place] Adelita PENNER, MD    Allergies: Lemon juice, Orange juice, Citrus, Penicillins, Sulfa antibiotics, Amoxicillin, and Latex    Review of Systems  Cardiovascular:  Positive for chest pain.    Updated Vital Signs BP (!) 142/80   Pulse (!) 107   Temp 98 F (36.7 C) (Oral)   Resp 15   LMP 03/23/2024 (Exact Date)   SpO2 99%   Physical Exam Vitals and nursing note reviewed.  Constitutional:      General: She is not in acute distress.    Appearance: She is well-developed. She is not diaphoretic.     Comments: BMI elevated  HENT:     Head: Normocephalic and atraumatic.  Eyes:     Pupils: Pupils are equal, round, and reactive to light.  Cardiovascular:     Rate and Rhythm: Normal rate and regular rhythm.     Heart sounds: No murmur heard.    No friction rub. No gallop.  Pulmonary:     Effort: Pulmonary effort is normal.     Breath sounds: No wheezing or rales.     Comments: Exam limited to body habitus, distant breath sounds prolonged expiratory effort no obvious wheezes. Abdominal:  General: There is no distension.     Palpations: Abdomen is soft.     Tenderness: There is no abdominal tenderness.  Musculoskeletal:        General: No tenderness.     Cervical back: Normal range of motion and neck supple.  Skin:    General: Skin is warm and dry.  Neurological:     Mental Status: She is alert and oriented to person, place, and time.  Psychiatric:        Behavior: Behavior normal.     (all labs ordered are listed, but only abnormal results are displayed) Labs Reviewed  BASIC METABOLIC PANEL WITH GFR - Abnormal; Notable for the following components:      Result Value   Glucose, Bld 138 (*)    All other components within normal limits  CBC - Abnormal; Notable for the following components:   WBC 11.8 (*)    Hemoglobin 10.0 (*)    HCT 32.5 (*)     MCV 71.6 (*)    MCH 22.0 (*)    All other components within normal limits  RESP PANEL BY RT-PCR (RSV, FLU A&B, COVID)  RVPGX2  TROPONIN T, HIGH SENSITIVITY    EKG: None  Radiology: DG Chest 2 View Result Date: 03/28/2024 CLINICAL DATA:  Chest pain for several days EXAM: CHEST - 2 VIEW COMPARISON:  01/13/2024 FINDINGS: The heart size and mediastinal contours are within normal limits. Both lungs are clear. The visualized skeletal structures are unremarkable. IMPRESSION: No active cardiopulmonary disease. Electronically Signed   By: Oneil Devonshire M.D.   On: 03/28/2024 21:02     Procedures   Medications Ordered in the ED  dexamethasone  (DECADRON ) tablet 10 mg (has no administration in time range)  ipratropium-albuterol  (DUONEB) 0.5-2.5 (3) MG/3ML nebulizer solution 3 mL (3 mLs Nebulization Given 03/28/24 2111)                                    Medical Decision Making Amount and/or Complexity of Data Reviewed Labs: ordered. Radiology: ordered. ECG/medicine tests: ordered.  Risk Prescription drug management.   33 yo F with a chief complaint of cough congestion chest pain going on for about a week or so.  Perhaps a component of reactive airway disease or bronchitis.  Will trial a DuoNeb.  Chest x-ray blood work reassess.  Chest x-ray independently interpreted by me without obvious focal infiltrate or pneumothorax.  Troponin negative no significant anemia no significant electrolyte abnormalities.  COVID flu and RSV are negative.  Trial of DuoNeb here without obvious improvement.  Will try a Z-Pak for home.  PCP follow-up.  9:25 PM:  I have discussed the diagnosis/risks/treatment options with the patient.  Evaluation and diagnostic testing in the emergency department does not suggest an emergent condition requiring admission or immediate intervention beyond what has been performed at this time.  They will follow up with PCP. We also discussed returning to the ED immediately  if new or worsening sx occur. We discussed the sx which are most concerning (e.g., sudden worsening pain, fever, inability to tolerate by mouth, sob) that necessitate immediate return. Medications administered to the patient during their visit and any new prescriptions provided to the patient are listed below.  Medications given during this visit Medications  dexamethasone  (DECADRON ) tablet 10 mg (has no administration in time range)  ipratropium-albuterol  (DUONEB) 0.5-2.5 (3) MG/3ML nebulizer solution 3 mL (3 mLs Nebulization Given  03/28/24 2111)     The patient appears reasonably screen and/or stabilized for discharge and I doubt any other medical condition or other [facility] requiring further screening, evaluation, or treatment in the ED at this time prior to discharge.       Final diagnoses:  Viral URI with cough    ED Discharge Orders          Ordered    azithromycin  (ZITHROMAX ) 250 MG tablet  Daily        03/28/24 2123               Emil Share, DO 03/28/24 2125

## 2024-03-28 NOTE — Discharge Instructions (Signed)
 Take tylenol  2 pills 4 times a day and motrin  4 pills 3 times a day.  Drink plenty of fluids.  Return for worsening shortness of breath, headache, confusion. Follow up with your family doctor.   Follow up with your family doc in the office.
